# Patient Record
Sex: Female | Born: 1985
Health system: Southern US, Community
[De-identification: ages and names within clinical notes are randomized; demographics above are authoritative.]

## PROBLEM LIST (undated history)

## (undated) DIAGNOSIS — Z87442 Personal history of urinary calculi: Secondary | ICD-10-CM

## (undated) DIAGNOSIS — Z801 Family history of malignant neoplasm of trachea, bronchus and lung: Secondary | ICD-10-CM

## (undated) DIAGNOSIS — Z803 Family history of malignant neoplasm of breast: Secondary | ICD-10-CM

## (undated) DIAGNOSIS — F419 Anxiety disorder, unspecified: Secondary | ICD-10-CM

## (undated) DIAGNOSIS — C801 Malignant (primary) neoplasm, unspecified: Secondary | ICD-10-CM

## (undated) DIAGNOSIS — R51 Headache: Secondary | ICD-10-CM

## (undated) DIAGNOSIS — F32A Depression, unspecified: Secondary | ICD-10-CM

## (undated) DIAGNOSIS — Z800 Family history of malignant neoplasm of digestive organs: Secondary | ICD-10-CM

## (undated) DIAGNOSIS — F329 Major depressive disorder, single episode, unspecified: Secondary | ICD-10-CM

## (undated) DIAGNOSIS — R519 Headache, unspecified: Secondary | ICD-10-CM

## (undated) HISTORY — DX: Malignant (primary) neoplasm, unspecified: C80.1

## (undated) HISTORY — DX: Family history of malignant neoplasm of trachea, bronchus and lung: Z80.1

## (undated) HISTORY — DX: Family history of malignant neoplasm of breast: Z80.3

## (undated) HISTORY — DX: Family history of malignant neoplasm of digestive organs: Z80.0

## (undated) HISTORY — PX: OTHER SURGICAL HISTORY: SHX169

---

## 2008-03-08 ENCOUNTER — Emergency Department (HOSPITAL_COMMUNITY): Admission: EM | Admit: 2008-03-08 | Discharge: 2008-03-09 | Payer: Self-pay | Admitting: Emergency Medicine

## 2008-06-14 ENCOUNTER — Emergency Department (HOSPITAL_COMMUNITY): Admission: EM | Admit: 2008-06-14 | Discharge: 2008-06-14 | Payer: Self-pay | Admitting: Emergency Medicine

## 2009-11-18 ENCOUNTER — Inpatient Hospital Stay (HOSPITAL_COMMUNITY): Admission: AD | Admit: 2009-11-18 | Discharge: 2009-11-18 | Payer: Self-pay | Admitting: Obstetrics & Gynecology

## 2010-09-17 LAB — POCT PREGNANCY, URINE: Preg Test, Ur: NEGATIVE

## 2010-09-17 LAB — WET PREP, GENITAL
Clue Cells Wet Prep HPF POC: NONE SEEN
Trich, Wet Prep: NONE SEEN
Yeast Wet Prep HPF POC: NONE SEEN

## 2010-09-17 LAB — CBC
HCT: 35 % — ABNORMAL LOW (ref 36.0–46.0)
Hemoglobin: 12.1 g/dL (ref 12.0–15.0)
MCHC: 34.5 g/dL (ref 30.0–36.0)
MCV: 89.4 fL (ref 78.0–100.0)
Platelets: 260 10*3/uL (ref 150–400)
RBC: 3.91 MIL/uL (ref 3.87–5.11)
RDW: 12.9 % (ref 11.5–15.5)
WBC: 7.2 10*3/uL (ref 4.0–10.5)

## 2010-09-17 LAB — URINALYSIS, ROUTINE W REFLEX MICROSCOPIC
Bilirubin Urine: NEGATIVE
Glucose, UA: NEGATIVE mg/dL
Ketones, ur: NEGATIVE mg/dL
Nitrite: NEGATIVE
Protein, ur: 30 mg/dL — AB
Specific Gravity, Urine: 1.02 (ref 1.005–1.030)
Urobilinogen, UA: 0.2 mg/dL (ref 0.0–1.0)
pH: 6 (ref 5.0–8.0)

## 2010-09-17 LAB — GC/CHLAMYDIA PROBE AMP, GENITAL
Chlamydia, DNA Probe: NEGATIVE
GC Probe Amp, Genital: NEGATIVE

## 2010-09-17 LAB — URINE MICROSCOPIC-ADD ON

## 2011-04-05 LAB — URINE MICROSCOPIC-ADD ON

## 2011-04-05 LAB — URINALYSIS, ROUTINE W REFLEX MICROSCOPIC
Bilirubin Urine: NEGATIVE
Glucose, UA: NEGATIVE mg/dL
Ketones, ur: 40 mg/dL — AB
Leukocytes, UA: NEGATIVE
Nitrite: NEGATIVE
Protein, ur: 100 mg/dL — AB
Specific Gravity, Urine: 1.031 — ABNORMAL HIGH (ref 1.005–1.030)
Urobilinogen, UA: 0.2 mg/dL (ref 0.0–1.0)
pH: 5.5 (ref 5.0–8.0)

## 2011-04-05 LAB — POCT PREGNANCY, URINE: Preg Test, Ur: NEGATIVE

## 2018-02-03 ENCOUNTER — Ambulatory Visit: Payer: Self-pay | Admitting: Allergy and Immunology

## 2018-03-03 ENCOUNTER — Encounter: Payer: Self-pay | Admitting: Gynecology

## 2018-03-03 ENCOUNTER — Telehealth: Payer: Self-pay | Admitting: *Deleted

## 2018-03-03 ENCOUNTER — Ambulatory Visit: Payer: BLUE CROSS/BLUE SHIELD | Admitting: Gynecology

## 2018-03-03 VITALS — BP 116/74 | Ht 62.0 in | Wt 137.0 lb

## 2018-03-03 DIAGNOSIS — Z308 Encounter for other contraceptive management: Secondary | ICD-10-CM

## 2018-03-03 DIAGNOSIS — Z30432 Encounter for removal of intrauterine contraceptive device: Secondary | ICD-10-CM

## 2018-03-03 DIAGNOSIS — N6320 Unspecified lump in the left breast, unspecified quadrant: Secondary | ICD-10-CM

## 2018-03-03 DIAGNOSIS — Z13220 Encounter for screening for lipoid disorders: Secondary | ICD-10-CM

## 2018-03-03 DIAGNOSIS — N63 Unspecified lump in unspecified breast: Secondary | ICD-10-CM

## 2018-03-03 DIAGNOSIS — Z01411 Encounter for gynecological examination (general) (routine) with abnormal findings: Secondary | ICD-10-CM | POA: Diagnosis not present

## 2018-03-03 DIAGNOSIS — Z1151 Encounter for screening for human papillomavirus (HPV): Secondary | ICD-10-CM

## 2018-03-03 LAB — CBC WITH DIFFERENTIAL/PLATELET
Basophils Absolute: 58 cells/uL (ref 0–200)
Basophils Relative: 1.1 %
Eosinophils Absolute: 133 cells/uL (ref 15–500)
Eosinophils Relative: 2.5 %
HCT: 35 % (ref 35.0–45.0)
Hemoglobin: 11.4 g/dL — ABNORMAL LOW (ref 11.7–15.5)
Lymphs Abs: 1882 cells/uL (ref 850–3900)
MCH: 27.7 pg (ref 27.0–33.0)
MCHC: 32.6 g/dL (ref 32.0–36.0)
MCV: 85.2 fL (ref 80.0–100.0)
MPV: 10.7 fL (ref 7.5–12.5)
Monocytes Relative: 8.9 %
Neutro Abs: 2756 cells/uL (ref 1500–7800)
Neutrophils Relative %: 52 %
Platelets: 363 10*3/uL (ref 140–400)
RBC: 4.11 10*6/uL (ref 3.80–5.10)
RDW: 12.6 % (ref 11.0–15.0)
Total Lymphocyte: 35.5 %
WBC mixed population: 472 cells/uL (ref 200–950)
WBC: 5.3 10*3/uL (ref 3.8–10.8)

## 2018-03-03 LAB — LIPID PANEL
Cholesterol: 240 mg/dL — ABNORMAL HIGH (ref <200)
HDL: 100 mg/dL (ref >50)
LDL Cholesterol (Calc): 124 mg/dL (calc) — ABNORMAL HIGH
Non-HDL Cholesterol (Calc): 140 mg/dL (calc) — ABNORMAL HIGH (ref <130)
Total CHOL/HDL Ratio: 2.4 (calc) (ref <5.0)
Triglycerides: 72 mg/dL (ref <150)

## 2018-03-03 LAB — COMPREHENSIVE METABOLIC PANEL
AG Ratio: 1.9 (calc) (ref 1.0–2.5)
ALT: 11 U/L (ref 6–29)
AST: 12 U/L (ref 10–30)
Albumin: 4.3 g/dL (ref 3.6–5.1)
Alkaline phosphatase (APISO): 54 U/L (ref 33–115)
BUN: 9 mg/dL (ref 7–25)
CO2: 28 mmol/L (ref 20–32)
Calcium: 9.4 mg/dL (ref 8.6–10.2)
Chloride: 106 mmol/L (ref 98–110)
Creat: 0.63 mg/dL (ref 0.50–1.10)
Globulin: 2.3 g/dL (calc) (ref 1.9–3.7)
Glucose, Bld: 95 mg/dL (ref 65–99)
Potassium: 5 mmol/L (ref 3.5–5.3)
Sodium: 138 mmol/L (ref 135–146)
Total Bilirubin: 0.4 mg/dL (ref 0.2–1.2)
Total Protein: 6.6 g/dL (ref 6.1–8.1)

## 2018-03-03 NOTE — Telephone Encounter (Signed)
-----   Message from Anastasio Auerbach, MD sent at 03/03/2018 11:23 AM EDT ----- Schedule at the breast center diagnostic mammography and ultrasound reference new onset left breast mass 1 o'clock position 1 fingerbreadth off of areola

## 2018-03-03 NOTE — Telephone Encounter (Signed)
Patient scheduled on 03/06/18 @ 1:30pm at breast center, will route to claudia to relay.

## 2018-03-03 NOTE — Addendum Note (Signed)
Addended by: Nelva Nay on: 03/03/2018 12:22 PM   Modules accepted: Orders

## 2018-03-03 NOTE — Patient Instructions (Signed)
Office will call to arrange the [place] IUD appointment  Office will call to arrange for the breast ultrasound and mammogram

## 2018-03-03 NOTE — Telephone Encounter (Signed)
Patient informed. Phone number and address given to patient to the breast center.

## 2018-03-03 NOTE — Progress Notes (Signed)
Samantha Mcneil May 28, 1986 [id number]        32 y.o.  G2P2 new patient for annual gynecologic exam.  Reports it has been 4 to 5 years since her last GYN exam.  Patient also wants to discuss contraceptive options.  Currently has an IUD that she believes is a copper IUD but thought that they told her it was good for 5 years.  Has been in place for 10 years.  Having sporadic periods.  Also has noticed a lump in her left breast over the last 1 to 2 months.  It is not painful.  Has not noticed it previously.  Past medical history,surgical history, problem list, medications, allergies, family history and social history were all reviewed and documented as reviewed in the EPIC chart.  ROS:  Performed with pertinent positives and negatives included in the history, assessment and plan.   Additional significant findings : None   Exam: [name] assistant Vitals:   03/03/18 1010  BP: 116/74  Weight: 137 lb (62.1 kg)  Height: 5\' 2"  (1.575 m)   Body mass index is 25.06 kg/m.  General appearance:  Normal affect, orientation and appearance. Skin: Grossly normal HEENT: Without gross lesions.  No cervical or supraclavicular adenopathy. Thyroid normal.  Lungs:  Clear without wheezing, rales or rhonchi Cardiac: RR, without RMG Abdominal:  Soft, nontender, without masses, guarding, rebound, organomegaly or hernia Breasts:  Examined lying and sitting.  Right without masses, retractions, discharge or axillary adenopathy.  Left with 1.5 to 2 cm firm nodular mass, mobile, no overlying skin changes 12 to 1 o'clock position 1 fingerbreadth off of areola.  Physical Exam  Pulmonary/Chest:      Pelvic:  Ext, BUS, Vagina: Normal  Cervix: Normal.  IUD string visualized.  Pap smear/HPV  Uterus: Axial, normal size, shape and contour, midline and mobile nontender   Adnexa: Without masses or tenderness    Anus and perineum: Normal   Rectovaginal: Normal sphincter tone without palpated masses or  tenderness.   Procedures:  1. The IUD string was grasped with a [place] forcep and the IUD was removed without difficulty.  It was a ParaGard IUD.  It was shown to the patient and discarded. 2. The skin overlying the left breast mass was cleansed with alcohol and using an 18-gauge needle several passes were made into the mass without return of any fluid.  Band-Aid applied afterwards.  Patient tolerated well  Assessment/Plan:  32 y.o. G2P2 female for annual gynecologic exam with irregular menses, IUD contraception.   1. IUD.  Her ParaGard IUD was removed to verify the type.  We discussed all contraceptive options available to include pills, Nexplanon, Depo-Provera, IUDs, sterilization.  The pros and cons of each choice discussed.  She does have a history of migraine headaches.  Had tried pills years ago but had issues remembering.  We discussed possible increased risk of stroke associated with combination contraception such as pill patch or ring.  She also had tried Depo-Provera in the past.  At this point she is leaning towards replacement of her IUD.  We discussed the pros and cons of the Mirena versus ParaGard.  Issues of hormonal absorption with the Mirena discussed.  At this point the patient wants to move forward with following up for a Mirena IUD and she will schedule an appointment in follow-up for this. 2. Left breast mass.  Firm on exam.  No fluid on aspiration.  Discussed possible/probable fibroadenoma possible smaller cyst I could not get into  versus unlikely malignancy.  Discussed the need for further work-up to include mammogram and ultrasound.  Patient knows to expect a call from the breast center and will call us if she does not hear from them. 3. Pap smear 5 to 6 years ago.  Pap smear/HPV today.  No history of significant abnormal Pap smears previously. 4. Health maintenance.  Baseline CBC, CMP and lipid profile ordered.  Patient will follow-up for Mirena IUD placement as scheduled.   Patient will follow-up for mammography and breast ultrasound.  Follow-up in 1 year for annual exam.   Anastasio Auerbach MD, 11:13 AM 03/03/2018

## 2018-03-04 LAB — PAP IG AND HPV HIGH-RISK: HPV DNA High Risk: NOT DETECTED

## 2018-03-05 ENCOUNTER — Other Ambulatory Visit: Payer: Self-pay | Admitting: Obstetrics & Gynecology

## 2018-03-05 DIAGNOSIS — E7800 Pure hypercholesterolemia, unspecified: Secondary | ICD-10-CM

## 2018-03-06 ENCOUNTER — Ambulatory Visit
Admission: RE | Admit: 2018-03-06 | Discharge: 2018-03-06 | Disposition: A | Discharge status: Home / Self Care | Payer: BLUE CROSS/BLUE SHIELD | Source: Ambulatory Visit | Attending: Gynecology | Admitting: Gynecology

## 2018-03-06 ENCOUNTER — Other Ambulatory Visit: Payer: Self-pay | Admitting: Gynecology

## 2018-03-06 DIAGNOSIS — N6320 Unspecified lump in the left breast, unspecified quadrant: Secondary | ICD-10-CM

## 2018-03-06 DIAGNOSIS — R599 Enlarged lymph nodes, unspecified: Secondary | ICD-10-CM

## 2018-03-12 ENCOUNTER — Ambulatory Visit
Admission: RE | Admit: 2018-03-12 | Discharge: 2018-03-12 | Disposition: A | Discharge status: Home / Self Care | Payer: BLUE CROSS/BLUE SHIELD | Source: Ambulatory Visit | Attending: Gynecology | Admitting: Gynecology

## 2018-03-12 ENCOUNTER — Other Ambulatory Visit: Payer: Self-pay | Admitting: Gynecology

## 2018-03-12 DIAGNOSIS — N6320 Unspecified lump in the left breast, unspecified quadrant: Secondary | ICD-10-CM

## 2018-03-12 DIAGNOSIS — R599 Enlarged lymph nodes, unspecified: Secondary | ICD-10-CM

## 2018-03-16 ENCOUNTER — Other Ambulatory Visit: Payer: Self-pay | Admitting: General Surgery

## 2018-03-16 DIAGNOSIS — C50412 Malignant neoplasm of upper-outer quadrant of left female breast: Secondary | ICD-10-CM

## 2018-03-17 ENCOUNTER — Telehealth: Payer: Self-pay | Admitting: Hematology and Oncology

## 2018-03-17 NOTE — Telephone Encounter (Signed)
New referral from Dr. Barry Dienes for breast cancer. Pt has been scheduled to see Dr. Lindi Adie on 9/18 at 11am. Pt aware to arrive 30 minutes early. Letter mailed.

## 2018-03-18 ENCOUNTER — Telehealth: Payer: Self-pay | Admitting: Hematology and Oncology

## 2018-03-18 ENCOUNTER — Encounter: Payer: Self-pay | Admitting: Hematology and Oncology

## 2018-03-18 ENCOUNTER — Other Ambulatory Visit: Payer: Self-pay | Admitting: General Surgery

## 2018-03-18 ENCOUNTER — Encounter: Payer: Self-pay | Admitting: *Deleted

## 2018-03-18 ENCOUNTER — Inpatient Hospital Stay: Payer: BLUE CROSS/BLUE SHIELD | Attending: Hematology and Oncology | Admitting: Hematology and Oncology

## 2018-03-18 DIAGNOSIS — Z79899 Other long term (current) drug therapy: Secondary | ICD-10-CM | POA: Insufficient documentation

## 2018-03-18 DIAGNOSIS — C50412 Malignant neoplasm of upper-outer quadrant of left female breast: Secondary | ICD-10-CM | POA: Insufficient documentation

## 2018-03-18 DIAGNOSIS — Z23 Encounter for immunization: Secondary | ICD-10-CM | POA: Insufficient documentation

## 2018-03-18 DIAGNOSIS — Z171 Estrogen receptor negative status [ER-]: Principal | ICD-10-CM

## 2018-03-18 MED ORDER — VENLAFAXINE HCL ER 37.5 MG PO CP24: 37.5000 mg | ORAL_CAPSULE | Freq: Every day | ORAL | 6 refills | Status: DC

## 2018-03-18 MED FILL — VENLAFAXINE HCL ER 37.5 MG: 37.5 | 30 days supply | Qty: 30 | Fill #0

## 2018-03-18 NOTE — Telephone Encounter (Signed)
Gave avs and calendar patient ok with 10/2 and 10/3

## 2018-03-18 NOTE — Progress Notes (Signed)
[facility] CONSULT NOTE  Patient Care Team: Patient, [name] as PCP - General (General Practice)  CHIEF COMPLAINTS/PURPOSE OF CONSULTATION:  Newly diagnosed breast cancer  HISTORY OF PRESENTING ILLNESS:  Samantha Mcneil 32 y.o. female is here because of recent diagnosis of left breast cancer.  Patient felt lumps in both her breasts and she underwent testing with mammogram and ultrasound.  There was a 2.3 cm mass in the left breast and biopsy came back as invasive ductal carcinoma grade 3 that was triple negative with a Ki-67 90%.  She had a lymph node biopsy which was benign.  Patient was presented this morning at the multidisciplinary tumor board and she is here today to discuss her treatment plan.  I reviewed her records extensively and collaborated the history with the patient.  SUMMARY OF ONCOLOGIC HISTORY:   Malignant neoplasm of upper-outer quadrant of left breast in female, estrogen receptor negative ([place])   03/12/2018 Initial Diagnosis    Palpable masses in both breasts with family history of breast cancer, breast density category D, ultrasound measured these hypoechoic irregular mass left breast 2.3 cm, right breast no abnormality, biopsy left breast mass: IDC grade 3 ER 0%, PR 0%, HER-2 negative, Ki-67 90%, lymph node biopsy benign: T2N0    03/18/2018 Cancer Staging    Staging form: Breast, AJCC 8th Edition - Clinical: Stage IIB (cT2, cN0, cM0, G3, ER-, PR-, HER2-) - Signed by Nicholas Lose, MD on 03/18/2018     MEDICAL HISTORY:  No past medical history on file.  SURGICAL HISTORY: Past Surgical History:  Procedure Laterality Date  . CESAREAN SECTION    . OTHER SURGICAL HISTORY      SOCIAL HISTORY: Social History   Socioeconomic History  . Marital status: Married    Spouse name: Not on file  . Number of children: Not on file  . Years of education: Not on file  . Highest education level: Not on file  Occupational History  . Not on file  Social  Needs  . Financial resource strain: Not on file  . Food insecurity:    Worry: Not on file    Inability: Not on file  . Transportation needs:    Medical: Not on file    Non-medical: Not on file  Tobacco Use  . Smoking status: Never Smoker  . Smokeless tobacco: Never Used  Substance and Sexual Activity  . Alcohol use: Yes    Comment: Rare  . Drug use: Never  . Sexual activity: Yes    Comment: 1st intercourse 32 yo-Fewer than 5 partners  Lifestyle  . Physical activity:    Days per week: Not on file    Minutes per session: Not on file  . Stress: Not on file  Relationships  . Social connections:    Talks on phone: Not on file    Gets together: Not on file    Attends religious service: Not on file    Active member of club or organization: Not on file    Attends meetings of clubs or organizations: Not on file    Relationship status: Not on file  . Intimate partner violence:    Fear of current or ex partner: Not on file    Emotionally abused: Not on file    Physically abused: Not on file    Forced sexual activity: Not on file  Other Topics Concern  . Not on file  Social History Narrative  . Not on file  FAMILY HISTORY: Family History  Problem Relation Age of Onset  . Heart disease Father   . Breast cancer Maternal Aunt        30's  . Breast cancer Cousin        diagnosed in her 59's    ALLERGIES:  has No Known Allergies.  MEDICATIONS:  Current Outpatient Medications  Medication Sig Dispense Refill  . venlafaxine XR (EFFEXOR-XR) 37.5 MG 24 hr capsule Take 1 capsule (37.5 mg total) by mouth daily with breakfast. 30 capsule 6   No current facility-administered medications for this visit.     REVIEW OF SYSTEMS:   Constitutional: Denies fevers, chills or abnormal night sweats Eyes: Denies blurriness of vision, double vision or watery eyes Ears, nose, mouth, throat, and face: Denies mucositis or sore throat Respiratory: Denies cough, dyspnea or  wheezes Cardiovascular: Denies palpitation, chest discomfort or lower extremity swelling Gastrointestinal:  Denies nausea, heartburn or change in bowel habits Skin: Denies abnormal skin rashes Lymphatics: Denies new lymphadenopathy or easy bruising Neurological:Denies numbness, tingling or new weaknesses Behavioral/Psych: Mood is stable, no new changes  Breast: Palpable lumps in both breasts All other systems were reviewed with the patient and are negative.  PHYSICAL EXAMINATION: ECOG PERFORMANCE STATUS: 1 - Symptomatic but completely ambulatory  Vitals:   03/18/18 1058  BP: 113/76  Pulse: 84  Resp: 20  Temp: 98.3 F (36.8 C)  SpO2: 100%   Filed Weights   03/18/18 1058  Weight: 137 lb 11.2 oz (62.5 kg)    GENERAL:alert, no distress and comfortable SKIN: skin color, texture, turgor are normal, no rashes or significant lesions EYES: normal, conjunctiva are pink and non-injected, sclera clear OROPHARYNX:no exudate, no erythema and lips, buccal mucosa, and tongue normal  NECK: supple, thyroid normal size, non-tender, without nodularity LYMPH:  no palpable lymphadenopathy in the cervical, axillary or inguinal LUNGS: clear to auscultation and percussion with normal breathing effort HEART: regular rate & rhythm and no murmurs and no lower extremity edema ABDOMEN:abdomen soft, non-tender and normal bowel sounds Musculoskeletal:no cyanosis of digits and no clubbing  PSYCH: alert & oriented x 3 with fluent speech NEURO: no focal motor/sensory deficits BREAST: Palpable lump in the left breast.. No palpable axillary or supraclavicular lymphadenopathy (exam performed in the presence of a chaperone)   LABORATORY DATA:  I have reviewed the data as listed Lab Results  Component Value Date   WBC 5.3 03/03/2018   HGB 11.4 (L) 03/03/2018   HCT 35.0 03/03/2018   MCV 85.2 03/03/2018   PLT 363 03/03/2018   Lab Results  Component Value Date   NA 138 03/03/2018   K 5.0 03/03/2018   CL  106 03/03/2018   CO2 28 03/03/2018    RADIOGRAPHIC STUDIES: I have personally reviewed the radiological reports and agreed with the findings in the report.  ASSESSMENT AND PLAN:  Malignant neoplasm of upper-outer quadrant of left breast in female, estrogen receptor negative ([place]) 03/12/2018:Palpable masses in both breasts with family history of breast cancer, breast density category D, ultrasound measured these hypoechoic irregular mass left breast 2.3 cm, right breast no abnormality, biopsy left breast mass: IDC grade 3 ER 0%, PR 0%, HER-2 negative, Ki-67 90%, lymph node biopsy benign: T2N0, stage IIb  Pathology and radiology counseling: Discussed with the patient, the details of pathology including the type of breast cancer,the clinical staging, the significance of ER, PR and HER-2/neu receptors and the implications for treatment. After reviewing the pathology in detail, we proceeded to discuss the different  treatment options between surgery, radiation, chemotherapy, antiestrogen therapies.  Recommendation based on multidisciplinary tumor board: Genetics consultation 1. Neoadjuvant chemotherapy with Adriamycin and Cytoxan dose dense 4 followed by Taxol with carboplatin weekly 12 2. Followed by breast conserving surgery with sentinel lymph node study vs targeted axillary dissection 3. Followed by adjuvant radiation therapy  Chemotherapy Counseling: I discussed the risks and benefits of chemotherapy including the risks of nausea/ vomiting, risk of infection from low WBC count, fatigue due to chemo or anemia, bruising or bleeding due to low platelets, mouth sores, loss/ change in taste and decreased appetite. Liver and kidney function will be monitored through out chemotherapy as abnormalities in liver and kidney function may be a side effect of treatment. Cardiac dysfunction due to Adriamycin was discussed in detail. Risk of permanent bone marrow dysfunction due to chemo were also  discussed.  Plan: 1. Port placement 2. Echocardiogram 3. Chemotherapy class 4. Breast MRI  Genetic counseling will also be arranged  UPBEAT clinical trial ([place]): Newly diagnosed stage I to III breast cancer patients receiving either adjuvant or neoadjuvant chemotherapy undergo cardiac MRI before treatment and at 24 months along with neurocognitive testing, exercise and disability measures at baseline 3, 12 and 24 months.  Return to clinic in 2 weeks to start chemotherapy.   All questions were answered. The patient knows to call the clinic with any problems, questions or concerns.    Harriette Ohara, MD 03/18/18

## 2018-03-18 NOTE — Assessment & Plan Note (Signed)
03/12/2018:Palpable masses in both breasts with family history of breast cancer, breast density category D, ultrasound measured these hypoechoic irregular mass left breast 2.3 cm, right breast no abnormality, biopsy left breast mass: IDC grade 3 ER 0%, PR 0%, HER-2 negative, Ki-67 90%, lymph node biopsy benign: T2N0, stage IIb  Pathology and radiology counseling: Discussed with the patient, the details of pathology including the type of breast cancer,the clinical staging, the significance of ER, PR and HER-2/neu receptors and the implications for treatment. After reviewing the pathology in detail, we proceeded to discuss the different treatment options between surgery, radiation, chemotherapy, antiestrogen therapies.  Recommendation based on multidisciplinary tumor board: 1. Neoadjuvant chemotherapy with Adriamycin and Cytoxan dose dense 4 followed by Taxol with carboplatin weekly 12 2. Followed by breast conserving surgery with sentinel lymph node study vs targeted axillary dissection 3. Followed by adjuvant radiation therapy  Chemotherapy Counseling: I discussed the risks and benefits of chemotherapy including the risks of nausea/ vomiting, risk of infection from low WBC count, fatigue due to chemo or anemia, bruising or bleeding due to low platelets, mouth sores, loss/ change in taste and decreased appetite. Liver and kidney function will be monitored through out chemotherapy as abnormalities in liver and kidney function may be a side effect of treatment. Cardiac dysfunction due to Adriamycin was discussed in detail. Risk of permanent bone marrow dysfunction due to chemo were also discussed.  Plan: 1. Port placement 2. Echocardiogram 3. Chemotherapy class 4. Breast MRI  Genetic counseling will also be arranged  UPBEAT clinical trial ([place]): Newly diagnosed stage I to III breast cancer patients receiving either adjuvant or neoadjuvant chemotherapy undergo cardiac MRI before treatment and at  24 months along with neurocognitive testing, exercise and disability measures at baseline 3, 12 and 24 months.  Return to clinic in 2 weeks to start chemotherapy.

## 2018-03-18 NOTE — Progress Notes (Signed)
Pt came in to inquire about the [employer].  I went over what it covers, gave her an expense sheet and the income requirement.  She will bring [place] her proof of income on 03/30/18.  Since her treatment plan has not been put in yet, I informed her that Marguarite Arbour will contact her if additional assistance is available at that time.  I gave her Shauna's card for any questions or concerns she may have before her next visit.

## 2018-03-18 NOTE — Progress Notes (Signed)

## 2018-03-24 ENCOUNTER — Inpatient Hospital Stay: Payer: BLUE CROSS/BLUE SHIELD

## 2018-03-24 ENCOUNTER — Telehealth: Payer: Self-pay | Admitting: Hematology and Oncology

## 2018-03-24 ENCOUNTER — Ambulatory Visit (HOSPITAL_COMMUNITY)
Admission: RE | Admit: 2018-03-24 | Discharge: 2018-03-24 | Disposition: A | Discharge status: Home / Self Care | Payer: BLUE CROSS/BLUE SHIELD | Source: Ambulatory Visit | Attending: Hematology and Oncology | Admitting: Hematology and Oncology

## 2018-03-24 DIAGNOSIS — Z171 Estrogen receptor negative status [ER-]: Secondary | ICD-10-CM | POA: Diagnosis present

## 2018-03-24 DIAGNOSIS — C50412 Malignant neoplasm of upper-outer quadrant of left female breast: Secondary | ICD-10-CM | POA: Insufficient documentation

## 2018-03-24 NOTE — Telephone Encounter (Signed)
pts appts r/s per 9/24 sch message

## 2018-03-25 ENCOUNTER — Other Ambulatory Visit: Payer: Self-pay

## 2018-03-25 ENCOUNTER — Encounter: Payer: Self-pay | Admitting: Hematology and Oncology

## 2018-03-25 ENCOUNTER — Encounter (HOSPITAL_BASED_OUTPATIENT_CLINIC_OR_DEPARTMENT_OTHER): Payer: Self-pay | Admitting: *Deleted

## 2018-03-25 NOTE — Progress Notes (Signed)
Pt coming Monday to pick up Ensure. Bring all medications.

## 2018-03-25 NOTE — Progress Notes (Signed)
Called patient to introduce myself as Arboriculturist and to ask if she had any financial questions or concerns regarding your treatment. Patient states yes she isn't sure how much this will cost. Advised patient depending on her plan percentage, she will more than likely be left with an out of pocket expense unless she has met everything. Advised her to contact insurance company regarding specific amounts. Advised her there are copay programs she may apply for such as PAF which currently has funds available for her diagnosis. Advised her the exact amount of household income would be needed in case she is selected for audit. She verbalized understanding and will obtain her spouse's income information and get back with me. Also advised that [employer] has a copay assistance program for [place] only and I can apply on her behalf online. She verbalized understanding and agreed. Also advised she may apply for the one-time $1000 grant which may assist with medications and other personal expenses while gong through treatment and income is needed as well. She verbalized understanding. She will try to gather information and call me back or meet with me on 03/30/18 to provide. She has my contact name and number for any additional financial questions or concerns.  Attempted to enroll patient in [employer] online and received an error message. Contacted [employer] and spoke with Tiffany whom walked me through again and was unsuccessful. Was transferred to [place] whom also was having technical difficulties. He states it will be escalated and to retry on Friday morning.

## 2018-03-27 ENCOUNTER — Other Ambulatory Visit: Payer: Self-pay

## 2018-03-27 DIAGNOSIS — C50412 Malignant neoplasm of upper-outer quadrant of left female breast: Secondary | ICD-10-CM

## 2018-03-27 DIAGNOSIS — Z171 Estrogen receptor negative status [ER-]: Principal | ICD-10-CM

## 2018-03-28 ENCOUNTER — Other Ambulatory Visit: Payer: Self-pay

## 2018-03-28 ENCOUNTER — Inpatient Hospital Stay
Admission: RE | Admit: 2018-03-28 | Discharge: 2018-03-28 | Disposition: A | Discharge status: Home / Self Care | Payer: BLUE CROSS/BLUE SHIELD | Source: Ambulatory Visit | Attending: General Surgery | Admitting: General Surgery

## 2018-03-29 NOTE — Assessment & Plan Note (Signed)
03/12/2018:Palpable masses in both breasts with family history of breast cancer, breast density category D, ultrasound measured these hypoechoic irregular mass left breast 2.3 cm, right breast no abnormality, biopsy left breast mass: IDC grade 3 ER 0%, PR 0%, HER-2 negative, Ki-67 90%, lymph node biopsy benign: T2N0, stage IIb  Recommendation based on multidisciplinary tumor board: Genetics consultation 1. Neoadjuvant chemotherapy with Adriamycin and Cytoxan dose dense 4 followed by Taxol with carboplatin weekly 12 2. Followed by breast conserving surgery with sentinel lymph node study vs targeted axillary dissection 3. Followed by adjuvant radiation therapy ---------------------------------------------------------------------------------------------------------------- Current Treatment: Cycle 1 Dose dense Adriamycin and Cytoxan Chemo counseling completed Labs reviewed ECHO done one RTC in 1 week for tox check

## 2018-03-30 ENCOUNTER — Inpatient Hospital Stay (HOSPITAL_BASED_OUTPATIENT_CLINIC_OR_DEPARTMENT_OTHER): Payer: BLUE CROSS/BLUE SHIELD | Admitting: Hematology and Oncology

## 2018-03-30 ENCOUNTER — Ambulatory Visit
Admission: RE | Admit: 2018-03-30 | Discharge: 2018-03-30 | Disposition: A | Discharge status: Home / Self Care | Payer: BLUE CROSS/BLUE SHIELD | Source: Ambulatory Visit | Attending: General Surgery | Admitting: General Surgery

## 2018-03-30 ENCOUNTER — Inpatient Hospital Stay: Payer: BLUE CROSS/BLUE SHIELD

## 2018-03-30 ENCOUNTER — Encounter: Payer: Self-pay | Admitting: Hematology and Oncology

## 2018-03-30 VITALS — BP 104/59 | HR 65 | Temp 98.6°F | Resp 18 | Ht 64.0 in | Wt 137.1 lb

## 2018-03-30 DIAGNOSIS — Z79899 Other long term (current) drug therapy: Secondary | ICD-10-CM | POA: Diagnosis not present

## 2018-03-30 DIAGNOSIS — Z171 Estrogen receptor negative status [ER-]: Secondary | ICD-10-CM

## 2018-03-30 DIAGNOSIS — C50412 Malignant neoplasm of upper-outer quadrant of left female breast: Secondary | ICD-10-CM

## 2018-03-30 LAB — CBC WITH DIFFERENTIAL (CANCER CENTER ONLY)
Basophils Absolute: 0 10*3/uL (ref 0.0–0.1)
Basophils Relative: 1 %
Eosinophils Absolute: 0.1 10*3/uL (ref 0.0–0.5)
Eosinophils Relative: 3 %
HCT: 35.2 % (ref 34.8–46.6)
Hemoglobin: 11.7 g/dL (ref 11.6–15.9)
Lymphocytes Relative: 25 %
Lymphs Abs: 0.9 10*3/uL (ref 0.9–3.3)
MCH: 28.5 pg (ref 25.1–34.0)
MCHC: 33.2 g/dL (ref 31.5–36.0)
MCV: 85.7 fL (ref 79.5–101.0)
Monocytes Absolute: 0.4 10*3/uL (ref 0.1–0.9)
Monocytes Relative: 12 %
Neutro Abs: 2.2 10*3/uL (ref 1.5–6.5)
Neutrophils Relative %: 59 %
Platelet Count: 260 10*3/uL (ref 145–400)
RBC: 4.1 MIL/uL (ref 3.70–5.45)
RDW: 14.2 % (ref 11.2–14.5)
WBC Count: 3.6 10*3/uL — ABNORMAL LOW (ref 3.9–10.3)

## 2018-03-30 LAB — CMP (CANCER CENTER ONLY)
ALT: 14 U/L (ref 0–44)
AST: 14 U/L — ABNORMAL LOW (ref 15–41)
Albumin: 3.9 g/dL (ref 3.5–5.0)
Alkaline Phosphatase: 59 U/L (ref 38–126)
Anion gap: 6 (ref 5–15)
BUN: 9 mg/dL (ref 6–20)
CO2: 28 mmol/L (ref 22–32)
Calcium: 9.1 mg/dL (ref 8.9–10.3)
Chloride: 105 mmol/L (ref 98–111)
Creatinine: 0.66 mg/dL (ref 0.44–1.00)
GFR, Est AFR Am: >60 mL/min (ref >60)
GFR, Estimated: >60 mL/min (ref >60)
Glucose, Bld: 94 mg/dL (ref 70–99)
Potassium: 4.4 mmol/L (ref 3.5–5.1)
Sodium: 139 mmol/L (ref 135–145)
Total Bilirubin: 0.4 mg/dL (ref 0.3–1.2)
Total Protein: 6.7 g/dL (ref 6.5–8.1)

## 2018-03-30 MED ORDER — LIDOCAINE-PRILOCAINE 2.5-2.5 % EX CREA: TOPICAL_CREAM | CUTANEOUS | 3 refills | Status: DC

## 2018-03-30 MED ORDER — INFLUENZA VAC SPLIT QUAD 0.5 ML IM SUSY
0.5000 mL | PREFILLED_SYRINGE | Freq: Once | INTRAMUSCULAR | Status: AC
  Administered 2018-03-30: 0.5 mL via INTRAMUSCULAR

## 2018-03-30 MED ORDER — PROCHLORPERAZINE MALEATE 10 MG PO TABS: 10.0000 mg | ORAL_TABLET | Freq: Four times a day (QID) | ORAL | 1 refills | Status: DC | PRN

## 2018-03-30 MED ORDER — GADOBENATE DIMEGLUMINE 529 MG/ML IV SOLN
12.0000 mL | Freq: Once | INTRAVENOUS | Status: AC | PRN
  Administered 2018-03-30: 12 mL via INTRAVENOUS

## 2018-03-30 MED ORDER — ONDANSETRON HCL 8 MG PO TABS: 8.0000 mg | ORAL_TABLET | Freq: Two times a day (BID) | ORAL | 1 refills | Status: DC | PRN

## 2018-03-30 MED ORDER — INFLUENZA VAC SPLIT QUAD 0.5 ML IM SUSY
PREFILLED_SYRINGE | INTRAMUSCULAR | Status: AC
  Filled 2018-03-30: qty 0.5

## 2018-03-30 MED ORDER — DEXAMETHASONE 4 MG PO TABS: 4.0000 mg | ORAL_TABLET | Freq: Every day | ORAL | 0 refills | Status: AC

## 2018-03-30 MED ORDER — LORAZEPAM 0.5 MG PO TABS: 0.5000 mg | ORAL_TABLET | Freq: Every evening | ORAL | 0 refills | Status: DC | PRN

## 2018-03-30 MED FILL — ONDANSETRON HCL 8 MG TABLET: 8 | 15 days supply | Qty: 30 | Fill #0

## 2018-03-30 MED FILL — LORazepam 0.5 MG TABS: 0.5 | 30 days supply | Qty: 30 | Fill #0

## 2018-03-30 MED FILL — LIDOCAINE-PRILOCAINE CREAM: 2.5-2.5 | 30 days supply | Qty: 30 | Fill #0

## 2018-03-30 MED FILL — PROCHLORPERAZINE 10 MG TAB: 10 | 7 days supply | Qty: 30 | Fill #0

## 2018-03-30 MED FILL — DEXAMETHASONE 4 MG TABLET: 4 | 8 days supply | Qty: 8 | Fill #0

## 2018-03-30 NOTE — Progress Notes (Signed)
Went back on [employer] online and attempted to enroll patient in copay assistance for [place].  Patient successfully enrolled and approved for up to a maximum of $15,000 with a $0 copay for each treatment after insurance pays over the next 12 months. Her earliest acceptable claim date is 12/30/17. Paperwork given to [employer] for copay/billing purposes.

## 2018-03-30 NOTE — Progress Notes (Signed)
Patient Care Team: Patient, [name] as PCP - General (General Practice)  DIAGNOSIS:  Encounter Diagnosis  Name Primary?  . Malignant neoplasm of upper-outer quadrant of left breast in female, estrogen receptor negative ([place]) Yes    SUMMARY OF ONCOLOGIC HISTORY:   Malignant neoplasm of upper-outer quadrant of left breast in female, estrogen receptor negative ([place])   03/12/2018 Initial Diagnosis    Palpable masses in both breasts with family history of breast cancer, breast density category D, ultrasound measured these hypoechoic irregular mass left breast 2.3 cm, right breast no abnormality, biopsy left breast mass: IDC grade 3 ER 0%, PR 0%, HER-2 negative, Ki-67 90%, lymph node biopsy benign: T2N0    03/18/2018 Cancer Staging    Staging form: Breast, AJCC 8th Edition - Clinical: Stage IIB (cT2, cN0, cM0, G3, ER-, PR-, HER2-) - Signed by Nicholas Lose, MD on 03/18/2018     CHIEF COMPLIANT: Follow-up to discuss her treatment plan.  INTERVAL HISTORY: Samantha Mcneil is a 32 year old with above-mentioned history of palpable masses in both her breasts and was diagnosed with left breast cancer.  She is here today accompanied by her family to discuss additional treatment plans.  She is scheduled to undergo breast MRI today.  She has not been set up for the CT scans.  She has an appointment to start chemotherapy next week and she is going to [facility] for second opinion today.  REVIEW OF SYSTEMS:   Constitutional: Denies fevers, chills or abnormal weight loss Eyes: Denies blurriness of vision Ears, nose, mouth, throat, and face: Denies mucositis or sore throat Respiratory: Denies cough, dyspnea or wheezes Cardiovascular: Denies palpitation, chest discomfort Gastrointestinal:  Denies nausea, heartburn or change in bowel habits Skin: Denies abnormal skin rashes Lymphatics: Denies new lymphadenopathy or easy bruising Neurological:Denies numbness, tingling or new weaknesses Behavioral/Psych:  Mood is stable, no new changes  Extremities: No lower extremity edema  All other systems were reviewed with the patient and are negative.  I have reviewed the past medical history, past surgical history, social history and family history with the patient and they are unchanged from previous note.  ALLERGIES:  has No Known Allergies.  MEDICATIONS:  Current Outpatient Medications  Medication Sig Dispense Refill  . dexamethasone (DECADRON) 4 MG tablet Take 1 tablet (4 mg total) by mouth daily for 2 days. Take 1 tablet the day after chemo and 1 tablet 2 days after, with food 8 tablet 0  . ibuprofen (ADVIL,MOTRIN) 200 MG tablet Take 200 mg by mouth every 6 (six) hours as needed.    . lidocaine-prilocaine (EMLA) cream Apply to affected area once 30 g 3  . LORazepam (ATIVAN) 0.5 MG tablet Take 1 tablet (0.5 mg total) by mouth at bedtime as needed for sleep. 30 tablet 0  . ondansetron (ZOFRAN) 8 MG tablet Take 1 tablet (8 mg total) by mouth 2 (two) times daily as needed. Start on the third day after chemotherapy. 30 tablet 1  . prochlorperazine (COMPAZINE) 10 MG tablet Take 1 tablet (10 mg total) by mouth every 6 (six) hours as needed (Nausea or vomiting). 30 tablet 1  . venlafaxine XR (EFFEXOR-XR) 37.5 MG 24 hr capsule Take 1 capsule (37.5 mg total) by mouth daily with breakfast. 30 capsule 6   No current facility-administered medications for this visit.     PHYSICAL EXAMINATION: ECOG PERFORMANCE STATUS: 1 - Symptomatic but completely ambulatory  Vitals:   03/30/18 0932  BP: (!) 104/59  Pulse: 65  Resp: 18  Temp: 98.6 F (37 C)  SpO2: 100%   Filed Weights   03/30/18 0932  Weight: 137 lb 1.6 oz (62.2 kg)    GENERAL:alert, no distress and comfortable SKIN: skin color, texture, turgor are normal, no rashes or significant lesions EYES: normal, Conjunctiva are pink and non-injected, sclera clear OROPHARYNX:no exudate, no erythema and lips, buccal mucosa, and tongue normal  NECK:  supple, thyroid normal size, non-tender, without nodularity LYMPH:  no palpable lymphadenopathy in the cervical, axillary or inguinal LUNGS: clear to auscultation and percussion with normal breathing effort HEART: regular rate & rhythm and no murmurs and no lower extremity edema ABDOMEN:abdomen soft, non-tender and normal bowel sounds MUSCULOSKELETAL:no cyanosis of digits and no clubbing  NEURO: alert & oriented x 3 with fluent speech, no focal motor/sensory deficits EXTREMITIES: No lower extremity edema  LABORATORY DATA:  I have reviewed the data as listed CMP Latest Ref Rng & Units 03/30/2018 03/03/2018  Glucose 70 - 99 mg/dL 94 95  BUN 6 - 20 mg/dL 9 9  Creatinine 0.44 - 1.00 mg/dL 0.66 0.63  Sodium 135 - 145 mmol/L 139 138  Potassium 3.5 - 5.1 mmol/L 4.4 5.0  Chloride 98 - 111 mmol/L 105 106  CO2 22 - 32 mmol/L 28 28  Calcium 8.9 - 10.3 mg/dL 9.1 9.4  Total Protein 6.5 - 8.1 g/dL 6.7 6.6  Total Bilirubin 0.3 - 1.2 mg/dL 0.4 0.4  Alkaline Phos 38 - 126 U/L 59 -  AST 15 - 41 U/L 14(L) 12  ALT 0 - 44 U/L 14 11    Lab Results  Component Value Date   WBC 3.6 (L) 03/30/2018   HGB 11.7 03/30/2018   HCT 35.2 03/30/2018   MCV 85.7 03/30/2018   PLT 260 03/30/2018   NEUTROABS 2.2 03/30/2018    ASSESSMENT & PLAN:  Malignant neoplasm of upper-outer quadrant of left breast in female, estrogen receptor negative ([place]) 03/12/2018:Palpable masses in both breasts with family history of breast cancer, breast density category D, ultrasound measured these hypoechoic irregular mass left breast 2.3 cm, right breast no abnormality, biopsy left breast mass: IDC grade 3 ER 0%, PR 0%, HER-2 negative, Ki-67 90%, lymph node biopsy benign: T2N0, stage IIb  Recommendation based on multidisciplinary tumor board: Genetics consultation 1. Neoadjuvant chemotherapy with Adriamycin and Cytoxan dose dense 4 followed by Taxol with carboplatin weekly 12 2. Followed by breast conserving surgery with sentinel  lymph node study vs targeted axillary dissection 3. Followed by adjuvant radiation therapy ---------------------------------------------------------------------------------------------------------------- Current Treatment: Neoadjuvant chemotherapy to start next week. Echocardiogram 03/24/2018: EF 60 to 65% Chemo counseling completed Patient will need to have a CT chest abdomen pelvis.  This will be ordered. She is going to [facility] for second opinion.  She will inform me tomorrow after this appointment whether she wants to receive treatment here or at [facility]. I discussed with her that if there is a clinical trial then she may would be better served by being on the trial.  Return to clinic at the next schedule appointment.      Orders Placed This Encounter  Procedures  . CT Chest W Contrast    Standing Status:   Future    Standing Expiration Date:   03/30/2019    Order Specific Question:   ** REASON FOR EXAM (FREE TEXT)    Answer:   Back pain and abdominal pain with breast cancer triple neg disease    Order Specific Question:   If indicated for the ordered procedure,  I authorize the administration of contrast media per Radiology protocol    Answer:   Yes    Order Specific Question:   Is patient pregnant?    Answer:   No    Order Specific Question:   Preferred imaging location?    Answer:   [facility]    Order Specific Question:   Radiology Contrast Protocol - do NOT remove file path    Answer:   \\[email or web address]  . CT Abdomen Pelvis W Contrast    Standing Status:   Future    Standing Expiration Date:   03/30/2019    Order Specific Question:   ** REASON FOR EXAM (FREE TEXT)    Answer:   Breast cancer triple neg with abdominal pain    Order Specific Question:   If indicated for the ordered procedure, I authorize the administration of contrast media per Radiology protocol    Answer:   Yes    Order Specific Question:   Is patient  pregnant?    Answer:   No    Order Specific Question:   Preferred imaging location?    Answer:   [facility]    Order Specific Question:   Is Oral Contrast requested for this exam?    Answer:   Yes, Per Radiology protocol    Order Specific Question:   Radiology Contrast Protocol - do NOT remove file path    Answer:   \\[email or web address]  . CBC with Differential ([place] Only)    Standing Status:   Standing    Number of Occurrences:   20    Standing Expiration Date:   03/31/2019  . CMP ([place] only)    Standing Status:   Standing    Number of Occurrences:   20    Standing Expiration Date:   03/31/2019  . PHYSICIAN COMMUNICATION ORDER    A baseline Echo/ [place] should be obtained prior to initiation of Anthracycline Chemotherapy   The patient has a good understanding of the overall plan. she agrees with it. she will call with any problems that may develop before the next visit here.   Harriette Ohara, MD 03/30/18

## 2018-03-31 ENCOUNTER — Telehealth: Payer: Self-pay | Admitting: Hematology and Oncology

## 2018-03-31 NOTE — H&P (Signed)
Samantha Mcneil Documented: 03/16/2018 2:06 PM Location: [facility] Patient #: [id number] DOB: 05-11-1986 Married / Language: English / Race: Refused to Report/Unreported Female   History of Present Illness [name] MD; 03/16/2018 2:41 PM) The patient is a 32 year old female who presents with breast cancer. Pt is a 32 yo F referred by Dr. Derrel Nip for a new diagnosis of left breast cancer 03/2018. She presented with self detected palpable masses bilaterally. She also describes a full and heavy feeling with some soreness. Diagnostic mammogram showed no findings on right, ultrasound was not done originally, but then was done and showed no abnormality. Left diagnostic imaging showed 1.7 cm calcs in the UOQ and u/s showed a 2.3 cm mass in the region of the calcs as well as a borderline enlarged LN. The left side underwent core needle biopsy and showed grade 3 invasive ductal carcinoma with DCIS. The lymph node was negative. Prognostic panel is pending.   She has multiple family members who have had breast cancer. Her maternal aunt had breast cancer, did not have treatment, and died young. A maternal cousin (not this aunt's daughter) had breast cancer as well. Her mother had a history of colon polyps. She had menarche at age 28. She is a G2P2 wtih first child born in late teens.   Diagnosis 03/12/2018 1. Breast, left, needle core biopsy, 12 o'clock, 2cmfn - INVASIVE DUCTAL CARCINOMA, GRADE III. SEE NOTE. - DUCTAL CARCINOMA IN SITU, HIGH NUCLEAR GRADE. 2. Lymph node, needle/core biopsy, left axillary - BENIGN LYMPH NODE. - NEGATIVE FOR CARCINOMA.  dx mammo bilateral, LEFT u/s 03/06/2018 ACR Breast Density Category d: The breast tissue is extremely dense, which lowers the sensitivity of mammography.  FINDINGS: A BB has been placed on the upper-outer quadrant of the right breast near the nipple. No suspicious findings are identified deep to the palpable marker. There is another  palpable marker on the upper-outer quadrant of the left breast. In the region of this marker there are coarse heterogeneous calcifications spanning approximately 1.7 cm. No other suspicious calcifications, masses or areas of distortion are seen in the bilateral breasts.  Mammographic images were processed with CAD.  On physical exam, there is a firm palpable lump at the site of concern in the superior left breast. Physical exam of the palpable site in the upper-outer periareolar right breast demonstrates a softer more mobile ridge of tissue without definite mass.  Ultrasound of the left breast was performed demonstrating a heterogeneous hypoechoic irregular mass with internal calcifications measuring approximately 2.3 x 1.3 x 1.5 cm. Blood flow is seen within the mass on color Doppler imaging. Ultrasound of the left axilla demonstrates 1 prominent lymph node with cortex measuring up to 6 mm.  Ultrasound of the right breast was not performed today as we did not have an order for this exam.  IMPRESSION: 1. There is an indeterminate heterogeneous mass in the left breast at 12 o'clock at the palpable site of concern.  2. There is 1 indeterminate left axillary lymph node with cortex measuring up to 6 mm.  3. Incomplete evaluation of the palpable site in the right breast. Ultrasound is necessary to complete this evaluation. No mammographic abnormalities are identified in the right breast.  RECOMMENDATION: 1. Ultrasound-guided biopsy is recommended for the left breast mass and left axillary lymph node. This has been scheduled for 03/12/2018 at 12:45 p.m.  2. Ultrasound of the right breast will be performed the same day of the biopsy to complete evaluation  of the palpable right breast lump.  Right breast ultrasound 03/12/2018 FINDINGS: Targeted ultrasound is performed, showing no focal abnormality over the upper outer right periareolar region to account for patient's palpable  abnormality.  IMPRESSION: No focal abnormality over the upper outer right periareolar region to account for patient's palpable abnormality.  RECOMMENDATION: Recommend continued management of patient's right breast palpable abnormality on a clinical basis. Patient will undergo ultrasound core needle biopsy of the suspicious left breast mass and left axillary lymph node today.   Past Surgical History [name], MD; 03/16/2018 2:12 PM) Breast Biopsy  Left. Cesarean Section - Multiple   Diagnostic Studies History [name], MD; 03/16/2018 2:12 PM) Colonoscopy  never Mammogram  1-3 years ago Pap Smear  1-5 years ago  Allergies (Tanisha A. Owens Shark, [place]; 03/16/2018 2:07 PM) No Known Drug Allergies [03/16/2018]: Allergies Reconciled   Medication History (Tanisha A. Owens Shark, [place]; 03/16/2018 2:07 PM) No Current Medications Medications Reconciled  Social History [name], MD; 03/16/2018 2:12 PM) Alcohol use  Occasional alcohol use. Caffeine use  Coffee. No drug use  Tobacco use  Never smoker.  Family History [name], MD; 03/16/2018 2:12 PM) Alcohol Abuse  Father. Breast Cancer  Family Members In General. Cerebrovascular Accident  Father. Colon Polyps  Mother. Depression  Father. Heart Disease  Father. Hypertension  Father, Mother. Migraine Headache  Family Members In General.  Pregnancy / Birth History [name], MD; 03/16/2018 2:12 PM) Age at menarche  55 years. Contraceptive History  Intrauterine device. Gravida  2 Maternal age  78-20 Para  2 Regular periods   Other Problems [name], MD; 03/16/2018 2:12 PM) Kidney Stone     Review of Systems [name] MD; 03/16/2018 2:12 PM) General Present- Night Sweats. Not Present- Appetite Loss, Chills, Fatigue, Fever, Weight Gain and Weight Loss. Cardiovascular Present- Palpitations. Not Present- Chest Pain, Difficulty Breathing Lying Down, Leg Cramps, Rapid Heart Rate, Shortness  of Breath and Swelling of Extremities. Gastrointestinal Present- Abdominal Pain and Nausea. Not Present- Bloating, Bloody Stool, Change in Bowel Habits, Chronic diarrhea, Constipation, Difficulty Swallowing, Excessive gas, Gets full quickly at meals, Hemorrhoids, Indigestion, Rectal Pain and Vomiting. Neurological Present- Headaches. Not Present- Decreased Memory, Fainting, Numbness, Seizures, Tingling, Tremor, Trouble walking and Weakness. Psychiatric Present- Anxiety. Not Present- Bipolar, Change in Sleep Pattern, Depression, Fearful and Frequent crying.  Vitals (Tanisha A. Brown RMA; 03/16/2018 2:07 PM) 03/16/2018 2:06 PM Weight: 137.8 lb Height: 63in Body Surface Area: 1.65 m Body Mass Index: 24.41 kg/m  Temp.: 98.24F  Pulse: 81 (Regular)  BP: 122/82 (Sitting, Left Arm, Standard)       Physical Exam [name] MD; 03/16/2018 2:42 PM) General Mental Status-Alert. General Appearance-Consistent with stated age. Hydration-Well hydrated. Voice-Normal.  Head and Neck Head-normocephalic, atraumatic with no lesions or palpable masses. Trachea-midline. Thyroid Gland Characteristics - normal size and consistency.  Eye Eyeball - Bilateral-Extraocular movements intact. Sclera/Conjunctiva - Bilateral-No scleral icterus.  Chest and Lung Exam Chest and lung exam reveals -quiet, even and easy respiratory effort with no use of accessory muscles and on auscultation, normal breath sounds, no adventitious sounds and normal vocal resonance. Inspection Chest Wall - Normal. Back - normal.  Breast Note: dense breasts bilaterally. symmetric. palpable mass at 12-1 o'clock around 2 cm in diameter, anterior in left breast. mobile. tender. faint bruising. no LAD. Right breast wtih 8 mm firm mass near areolar border. no skin dimpling, no erythema.   Cardiovascular Cardiovascular examination reveals -normal heart sounds, regular rate and rhythm with no murmurs and  normal pedal pulses bilaterally.  Abdomen Inspection Inspection of the abdomen reveals - No Hernias. Palpation/Percussion Palpation and Percussion of the abdomen reveal - Soft, Non Tender, No Rebound tenderness, No Rigidity (guarding) and No hepatosplenomegaly. Auscultation Auscultation of the abdomen reveals - Bowel sounds normal.  Neurologic Neurologic evaluation reveals -alert and oriented x 3 with no impairment of recent or remote memory. Mental Status-Normal.  Musculoskeletal Global Assessment -Note: no gross deformities.  Normal Exam - Left-Upper Extremity Strength Normal and Lower Extremity Strength Normal. Normal Exam - Right-Upper Extremity Strength Normal and Lower Extremity Strength Normal.  Lymphatic Head & Neck  General Head & Neck Lymphatics: Bilateral - Description - Normal. Axillary  General Axillary Region: Bilateral - Description - Normal. Tenderness - Non Tender. Femoral & Inguinal  Generalized Femoral & Inguinal Lymphatics: Bilateral - Description - No Generalized lymphadenopathy.    Assessment & Plan [name] MD; 03/16/2018 2:46 PM) PRIMARY CANCER OF UPPER OUTER QUADRANT OF LEFT FEMALE BREAST (C50.412) Impression: Patient presents wtih at least a cT2N0 left breast cancer. We still do not have prognostic panel information. She may need chemo which could be given up front or post surgery. She is young with two family members having breast cancer, so I will refer her to genetics. I will also go ahead and send her to see oncology.  She will need an MRI due to the density of her breasts, her age, family history, and the need for further workup on the right breast mass.  Once we have this information, we will make a surgical plan. I discussed that we would determine that we recommend mastectomy (unilateral or bilateral), we will refer her to plastic surgery. Current Plans Referred to Oncology, for evaluation and follow up  (Oncology). Routine. MR BREAST BILAT W CON (63893) (palpable right breast mass without u/s or mammogram correlate. left breast cancer, age 85, breast density D.) FAMILY HISTORY OF BREAST CANCER IN FEMALE (Z80.3) Impression: See above. Current Plans Referred to Genetic Counseling, for evaluation and follow up (Medical Genetics). Routine. BREAST MASS, RIGHT (N63.10) Impression: See above.  MRI, then biopsy.    Signed by [name], MD (03/16/2018 2:47 PM)

## 2018-03-31 NOTE — Telephone Encounter (Signed)
LVm for pt regarding upcoming appts

## 2018-03-31 NOTE — Progress Notes (Signed)
Please let the patient know that no additional cancer is seen other than the mass we know about.

## 2018-03-31 NOTE — Progress Notes (Signed)
Ensure Pre-surgery drink given to family member with instructions to complete by 0700 DOS.  Surgical soap also given with instructions for use.  Family member verbalized understanding.

## 2018-04-01 ENCOUNTER — Ambulatory Visit (HOSPITAL_BASED_OUTPATIENT_CLINIC_OR_DEPARTMENT_OTHER): Payer: BLUE CROSS/BLUE SHIELD | Admitting: Anesthesiology

## 2018-04-01 ENCOUNTER — Ambulatory Visit: Payer: BLUE CROSS/BLUE SHIELD | Admitting: Hematology and Oncology

## 2018-04-01 ENCOUNTER — Other Ambulatory Visit: Payer: BLUE CROSS/BLUE SHIELD

## 2018-04-01 ENCOUNTER — Encounter (HOSPITAL_BASED_OUTPATIENT_CLINIC_OR_DEPARTMENT_OTHER)
Admission: RE | Disposition: A | Discharge status: Home / Self Care | Payer: Self-pay | Source: Ambulatory Visit | Attending: General Surgery

## 2018-04-01 ENCOUNTER — Ambulatory Visit (HOSPITAL_COMMUNITY): Payer: BLUE CROSS/BLUE SHIELD

## 2018-04-01 ENCOUNTER — Other Ambulatory Visit: Payer: Self-pay

## 2018-04-01 ENCOUNTER — Encounter (HOSPITAL_BASED_OUTPATIENT_CLINIC_OR_DEPARTMENT_OTHER): Payer: Self-pay | Admitting: Anesthesiology

## 2018-04-01 ENCOUNTER — Ambulatory Visit (HOSPITAL_BASED_OUTPATIENT_CLINIC_OR_DEPARTMENT_OTHER)
Admission: RE | Admit: 2018-04-01 | Discharge: 2018-04-01 | Disposition: A | Discharge status: Home / Self Care | Payer: BLUE CROSS/BLUE SHIELD | Source: Ambulatory Visit | Attending: General Surgery | Admitting: General Surgery

## 2018-04-01 DIAGNOSIS — C50412 Malignant neoplasm of upper-outer quadrant of left female breast: Secondary | ICD-10-CM | POA: Diagnosis not present

## 2018-04-01 DIAGNOSIS — Z803 Family history of malignant neoplasm of breast: Secondary | ICD-10-CM | POA: Insufficient documentation

## 2018-04-01 DIAGNOSIS — Z95828 Presence of other vascular implants and grafts: Secondary | ICD-10-CM

## 2018-04-01 HISTORY — DX: Anxiety disorder, unspecified: F41.9

## 2018-04-01 HISTORY — DX: Major depressive disorder, single episode, unspecified: F32.9

## 2018-04-01 HISTORY — DX: Headache, unspecified: R51.9

## 2018-04-01 HISTORY — DX: Personal history of urinary calculi: Z87.442

## 2018-04-01 HISTORY — DX: Headache: R51

## 2018-04-01 HISTORY — PX: PORTACATH PLACEMENT: SHX2246

## 2018-04-01 HISTORY — DX: Depression, unspecified: F32.A

## 2018-04-01 SURGERY — INSERTION, TUNNELED CENTRAL VENOUS DEVICE, WITH PORT
Anesthesia: General | Site: Chest | Laterality: Left

## 2018-04-01 MED ORDER — LACTATED RINGERS IV SOLN
INTRAVENOUS | Status: DC
  Administered 2018-04-01: 11:00:00 via INTRAVENOUS

## 2018-04-01 MED ORDER — OXYCODONE HCL 5 MG PO TABS: 5.0000 mg | ORAL_TABLET | Freq: Once | ORAL | Status: DC | PRN

## 2018-04-01 MED ORDER — ACETAMINOPHEN 500 MG PO TABS
ORAL_TABLET | ORAL | Status: AC
  Filled 2018-04-01: qty 2

## 2018-04-01 MED ORDER — ACETAMINOPHEN 500 MG PO TABS
1000.0000 mg | ORAL_TABLET | ORAL | Status: AC
  Administered 2018-04-01: 1000 mg via ORAL

## 2018-04-01 MED ORDER — MIDAZOLAM HCL 2 MG/2ML IJ SOLN
INTRAMUSCULAR | Status: AC
  Filled 2018-04-01: qty 2

## 2018-04-01 MED ORDER — CEFAZOLIN SODIUM-DEXTROSE 2-4 GM/100ML-% IV SOLN
2.0000 g | INTRAVENOUS | Status: AC
  Administered 2018-04-01: 2 g via INTRAVENOUS

## 2018-04-01 MED ORDER — HEPARIN (PORCINE) IN NACL 2-0.9 UNITS/ML
INTRAMUSCULAR | Status: AC | PRN
  Administered 2018-04-01: 1 via INTRAVENOUS

## 2018-04-01 MED ORDER — CHLORHEXIDINE GLUCONATE CLOTH 2 % EX PADS: 6.0000 | MEDICATED_PAD | Freq: Once | CUTANEOUS | Status: DC

## 2018-04-01 MED ORDER — FENTANYL CITRATE (PF) 100 MCG/2ML IJ SOLN: 25.0000 ug | INTRAMUSCULAR | Status: DC | PRN

## 2018-04-01 MED ORDER — FENTANYL CITRATE (PF) 100 MCG/2ML IJ SOLN
INTRAMUSCULAR | Status: AC
  Filled 2018-04-01: qty 2

## 2018-04-01 MED ORDER — PROMETHAZINE HCL 25 MG/ML IJ SOLN: 6.2500 mg | INTRAMUSCULAR | Status: DC | PRN

## 2018-04-01 MED ORDER — OXYCODONE HCL 5 MG PO TABS: 5.0000 mg | ORAL_TABLET | Freq: Four times a day (QID) | ORAL | 0 refills | Status: DC | PRN

## 2018-04-01 MED ORDER — GABAPENTIN 300 MG PO CAPS
ORAL_CAPSULE | ORAL | Status: AC
  Filled 2018-04-01: qty 1

## 2018-04-01 MED ORDER — FENTANYL CITRATE (PF) 100 MCG/2ML IJ SOLN
50.0000 ug | INTRAMUSCULAR | Status: DC | PRN
  Administered 2018-04-01 (×2): 50 ug via INTRAVENOUS

## 2018-04-01 MED ORDER — SCOPOLAMINE 1 MG/3DAYS TD PT72: 1.0000 | MEDICATED_PATCH | Freq: Once | TRANSDERMAL | Status: DC | PRN

## 2018-04-01 MED ORDER — LIDOCAINE HCL (CARDIAC) PF 100 MG/5ML IV SOSY
PREFILLED_SYRINGE | INTRAVENOUS | Status: DC | PRN
  Administered 2018-04-01: 100 mg via INTRAVENOUS

## 2018-04-01 MED ORDER — GABAPENTIN 300 MG PO CAPS
300.0000 mg | ORAL_CAPSULE | ORAL | Status: AC
  Administered 2018-04-01: 300 mg via ORAL

## 2018-04-01 MED ORDER — HEPARIN SOD (PORK) LOCK FLUSH 100 UNIT/ML IV SOLN
INTRAVENOUS | Status: DC | PRN
  Administered 2018-04-01: 500 [IU] via INTRAVENOUS

## 2018-04-01 MED ORDER — LIDOCAINE-EPINEPHRINE (PF) 1 %-1:200000 IJ SOLN
INTRAMUSCULAR | Status: DC | PRN
  Administered 2018-04-01: 9 mL

## 2018-04-01 MED ORDER — OXYCODONE HCL 5 MG/5ML PO SOLN: 5.0000 mg | Freq: Once | ORAL | Status: DC | PRN

## 2018-04-01 MED ORDER — DEXAMETHASONE SODIUM PHOSPHATE 4 MG/ML IJ SOLN
INTRAMUSCULAR | Status: DC | PRN
  Administered 2018-04-01: 10 mg via INTRAVENOUS

## 2018-04-01 MED ORDER — PROPOFOL 10 MG/ML IV BOLUS
INTRAVENOUS | Status: DC | PRN
  Administered 2018-04-01: 150 mg via INTRAVENOUS

## 2018-04-01 MED ORDER — CEFAZOLIN SODIUM-DEXTROSE 2-4 GM/100ML-% IV SOLN
INTRAVENOUS | Status: AC
  Filled 2018-04-01: qty 100

## 2018-04-01 MED ORDER — MEPERIDINE HCL 25 MG/ML IJ SOLN: 6.2500 mg | INTRAMUSCULAR | Status: DC | PRN

## 2018-04-01 MED ORDER — MIDAZOLAM HCL 2 MG/2ML IJ SOLN
1.0000 mg | INTRAMUSCULAR | Status: DC | PRN
  Administered 2018-04-01: 2 mg via INTRAVENOUS

## 2018-04-01 MED FILL — oxyCODONE HCL 5 MG TABS: 5 | 3 days supply | Qty: 10 | Fill #0

## 2018-04-01 SURGICAL SUPPLY — 41 items
BAG DECANTER FOR FLEXI CONT (MISCELLANEOUS) ×2 IMPLANT
BLADE HEX COATED 2.75 (ELECTRODE) ×2 IMPLANT
BLADE SURG 11 STRL SS (BLADE) ×2 IMPLANT
BLADE SURG 15 STRL LF DISP TIS (BLADE) ×1 IMPLANT
BLADE SURG 15 STRL SS (BLADE) ×1
CHLORAPREP W/TINT 26ML (MISCELLANEOUS) ×2 IMPLANT
COVER BACK TABLE 60X90IN (DRAPES) ×2 IMPLANT
COVER MAYO STAND STRL (DRAPES) ×2 IMPLANT
DECANTER SPIKE VIAL GLASS SM (MISCELLANEOUS) IMPLANT
DERMABOND ADVANCED (GAUZE/BANDAGES/DRESSINGS) ×1
DERMABOND ADVANCED .7 DNX12 (GAUZE/BANDAGES/DRESSINGS) ×1 IMPLANT
DRAPE C-ARM 42X72 X-RAY (DRAPES) ×2 IMPLANT
DRAPE LAPAROTOMY TRNSV 102X78 (DRAPE) ×2 IMPLANT
DRAPE UTILITY XL STRL (DRAPES) ×2 IMPLANT
DRSG TEGADERM 4X4.75 (GAUZE/BANDAGES/DRESSINGS) ×4 IMPLANT
ELECT REM PT RETURN 9FT ADLT (ELECTROSURGICAL) ×2
ELECTRODE REM PT RTRN 9FT ADLT (ELECTROSURGICAL) ×1 IMPLANT
GAUZE SPONGE 4X4 12PLY STRL LF (GAUZE/BANDAGES/DRESSINGS) IMPLANT
GLOVE BIO SURGEON STRL SZ 6 (GLOVE) ×2 IMPLANT
GLOVE BIO SURGEON STRL SZ 6.5 (GLOVE) ×2 IMPLANT
GLOVE BIOGEL PI IND STRL 6.5 (GLOVE) ×1 IMPLANT
GLOVE BIOGEL PI INDICATOR 6.5 (GLOVE) ×1
GOWN STRL REUS W/ TWL LRG LVL3 (GOWN DISPOSABLE) ×1 IMPLANT
GOWN STRL REUS W/TWL 2XL LVL3 (GOWN DISPOSABLE) ×2 IMPLANT
GOWN STRL REUS W/TWL LRG LVL3 (GOWN DISPOSABLE) ×1
IV CONNECTOR ONE LINK NDLESS (IV SETS) ×4 IMPLANT
KIT PORT POWER 8FR ISP CVUE (Port) ×2 IMPLANT
NEEDLE HYPO 25X1 1.5 SAFETY (NEEDLE) ×2 IMPLANT
PACK BASIN DAY SURGERY FS (CUSTOM PROCEDURE TRAY) ×2 IMPLANT
PENCIL BUTTON HOLSTER BLD 10FT (ELECTRODE) ×2 IMPLANT
SLEEVE SCD COMPRESS KNEE MED (MISCELLANEOUS) ×2 IMPLANT
SUT MNCRL AB 4-0 PS2 18 (SUTURE) ×2 IMPLANT
SUT PROLENE 2 0 SH DA (SUTURE) ×4 IMPLANT
SUT VIC AB 3-0 SH 27 (SUTURE) ×1
SUT VIC AB 3-0 SH 27X BRD (SUTURE) ×1 IMPLANT
SUT VICRYL 3-0 CR8 SH (SUTURE) IMPLANT
SYR 10ML LL (SYRINGE) ×2 IMPLANT
SYR 5ML LUER SLIP (SYRINGE) ×2 IMPLANT
SYR CONTROL 10ML LL (SYRINGE) ×2 IMPLANT
TOWEL GREEN STERILE FF (TOWEL DISPOSABLE) ×2 IMPLANT
TOWEL OR NON WOVEN STRL DISP B (DISPOSABLE) IMPLANT

## 2018-04-01 NOTE — Anesthesia Postprocedure Evaluation (Signed)
Anesthesia Post Note  Patient: Samantha Mcneil  Procedure(s) Performed: INSERTION PORT-A-CATH (Left Chest)     Patient location during evaluation: PACU Anesthesia Type: General Level of consciousness: sedated and patient cooperative Pain management: pain level controlled Vital Signs Assessment: post-procedure vital signs reviewed and stable Respiratory status: spontaneous breathing Cardiovascular status: stable Anesthetic complications: no    Last Vitals:  Vitals:   04/01/18 1300 04/01/18 1315  BP: 106/86 111/75  Pulse: 75 70  Resp: 13 (!) 28  Temp:    SpO2: 98% 100%    Last Pain:  Vitals:   04/01/18 1300  TempSrc:   PainSc: 0-No pain                 Nolon Nations

## 2018-04-01 NOTE — Anesthesia Preprocedure Evaluation (Signed)
Anesthesia Evaluation  Patient identified by MRN, date of birth, ID band Patient awake    Reviewed: Allergy & Precautions, NPO status , Patient's Chart, lab work & pertinent test results  Airway Mallampati: I  TM Distance: >3 FB Neck ROM: Full    Dental  (+) Dental Advisory Given, Teeth Intact   Pulmonary neg pulmonary ROS,    Pulmonary exam normal breath sounds clear to auscultation       Cardiovascular negative cardio ROS Normal cardiovascular exam Rhythm:Regular Rate:Normal     Neuro/Psych negative neurological ROS  negative psych ROS   GI/Hepatic negative GI ROS, Neg liver ROS,   Endo/Other  negative endocrine ROS  Renal/GU negative Renal ROS     Musculoskeletal negative musculoskeletal ROS (+)   Abdominal   Peds  Hematology negative hematology ROS (+)   Anesthesia Other Findings   Reproductive/Obstetrics negative OB ROS                             Anesthesia Physical Anesthesia Plan  ASA: II  Anesthesia Plan: General   Post-op Pain Management:    Induction: Intravenous  PONV Risk Score and Plan: 3 and Ondansetron, Dexamethasone and Midazolam  Airway Management Planned: LMA  Additional Equipment:   Intra-op Plan:   Post-operative Plan: Extubation in OR  Informed Consent: I have reviewed the patients History and Physical, chart, labs and discussed the procedure including the risks, benefits and alternatives for the proposed anesthesia with the patient or authorized representative who has indicated his/her understanding and acceptance.   Dental advisory given  Plan Discussed with: CRNA  Anesthesia Plan Comments:         Anesthesia Quick Evaluation

## 2018-04-01 NOTE — Transfer of Care (Signed)
Immediate Anesthesia Transfer of Care Note  Patient: Samantha Mcneil  Procedure(s) Performed: INSERTION PORT-A-CATH (Left Chest)  Patient Location: PACU  Anesthesia Type:General  Level of Consciousness: sedated  Airway & Oxygen Therapy: Patient Spontanous Breathing and Patient connected to face mask oxygen  Post-op Assessment: Report given to RN and Post -op Vital signs reviewed and stable  Post vital signs: Reviewed and stable  Last Vitals:  Vitals Value Taken Time  BP    Temp    Pulse 92 04/01/2018 12:34 PM  Resp 15 04/01/2018 12:34 PM  SpO2 100 % 04/01/2018 12:34 PM  Vitals shown include unvalidated device data.  Last Pain:  Vitals:   04/01/18 1113  TempSrc: Oral  PainSc: 0-No pain      Patients Stated Pain Goal: 0 (65/99/35 [id number])  Complications: No apparent anesthesia complications

## 2018-04-01 NOTE — Op Note (Signed)
PREOPERATIVE DIAGNOSIS:  Left breast cancer     POSTOPERATIVE DIAGNOSIS:  Same     PROCEDURE: Left subclavian port placement, Bard ClearVue Power Port, MRI safe, 8-French.      SURGEON:  [name], MD      ANESTHESIA:  General   FINDINGS:  Good venous return, easy flush, and tip of the catheter and   SVC 20 cm.      SPECIMEN:  None.      ESTIMATED BLOOD LOSS:  Minimal.      COMPLICATIONS:  None known.      PROCEDURE:  Pt was identified in the holding area and taken to   the operating room, where patient was placed supine on the operating room   table.  General anesthesia was induced.  Patient's arms were tucked and the upper   chest and neck were prepped and draped in sterile fashion.  Time-out was   performed according to the surgical safety check list.  When all was   correct, we continued.   Local anesthetic was administered over this   area at the angle of the clavicle.  The vein was accessed with 1 pass(es) of the needle. There was good venous return and the wire passed easily with no ectopy.   Fluoroscopy was used to confirm that the wire was in the vena cava.      The patient was placed back level and the area for the pocket was anethetized   with local anesthetic.  A 3-cm transverse incision was made with a #15   blade.  Cautery was used to divide the subcutaneous tissues down to the   pectoralis muscle.  An [employer] retractor was used to elevate the skin   while a pocket was created on top of the pectoralis fascia.  The port   was placed into the pocket to confirm that it was of adequate size.  The   catheter was preattached to the port.  The port was then secured to the   pectoralis fascia with four 2-0 Prolene sutures.  These were clamped and   not tied down yet.    The catheter was tunneled through to the wire exit   site.  The catheter was placed along the wire to determine what length it should be to be in the SVC.  The catheter was cut at 20 cm.  The tunneler  sheath and dilator were passed over the wire and the dilator and wire were removed.  The catheter was advanced through the tunneler sheath and the tunneler sheath was pulled away.  Care was taken to keep the catheter in the tunneler sheath as this occurred. This was advanced and the tunneler sheath was removed.  There was good venous   return and easy flush of the catheter.  The Prolene sutures were tied   down to the pectoral fascia.  The skin was reapproximated using 3-0   Vicryl interrupted deep dermal sutures.    Fluoroscopy was used to re-confirm good position of the catheter.  The skin   was then closed using 4-0 Monocryl in a subcuticular fashion.  The port was flushed with concentrated heparin flush as well.  The wounds were then cleaned, dried, and dressed with Dermabond.  The patient was awakened from anesthesia and taken to the PACU in stable condition.  Needle, sponge, and instrument counts were correct.               [name], MD

## 2018-04-01 NOTE — Interval H&P Note (Signed)
History and Physical Interval Note:  04/01/2018 11:17 AM  Samantha Mcneil  has presented today for surgery, with the diagnosis of LEFT BREAST CANCER  The various methods of treatment have been discussed with the patient and family. After consideration of risks, benefits and other options for treatment, the patient has consented to  Procedure(s): INSERTION PORT-A-CATH (N/A) as a surgical intervention .  The patient's history has been reviewed, patient examined, no change in status, stable for surgery.  I have reviewed the patient's chart and labs.  Questions were answered to the patient's satisfaction.     [name]

## 2018-04-01 NOTE — Discharge Instructions (Addendum)
[facility] °Office Phone Number [phone] ° ° POST OP INSTRUCTIONS ° °Always review your discharge instruction sheet given to you by the facility where your surgery was performed. ° °IF YOU HAVE DISABILITY OR FAMILY LEAVE FORMS, YOU MUST BRING THEM TO THE OFFICE FOR PROCESSING.  DO NOT GIVE THEM TO YOUR DOCTOR. ° °1. A prescription for pain medication may be given to you upon discharge.  Take your pain medication as prescribed, if needed.  If narcotic pain medicine is not needed, then you may take acetaminophen (Tylenol) or ibuprofen (Advil) as needed. °2. Take your usually prescribed medications unless otherwise directed °3. If you need a refill on your pain medication, please contact your pharmacy.  They will contact our office to request authorization.  Prescriptions will not be filled after 5pm or on week-ends. °4. You should eat very light the first 24 hours after surgery, such as soup, crackers, pudding, etc.  Resume your normal diet the day after surgery °5. It is common to experience some constipation if taking pain medication after surgery.  Increasing fluid intake and taking a stool softener will usually help or prevent this problem from occurring.  A mild laxative (Milk of Magnesia or Miralax) should be taken according to package directions if there are no bowel movements after 48 hours. °6. You may shower in 48 hours.  The surgical glue will flake off in 2-3 weeks.   °7. ACTIVITIES:  No strenuous activity or heavy lifting for 1 week.   °a. You may drive when you no longer are taking prescription pain medication, you can comfortably wear a seatbelt, and you can safely maneuver your car and apply brakes. °b. RETURN TO WORK:  __________1 week if applicable_______________ °You should see your doctor in the office for a follow-up appointment approximately three-four weeks after your surgery.   ° °WHEN TO CALL YOUR DOCTOR: °1. Fever over 101.0 °2. Nausea and/or vomiting. °3. Extreme  swelling or bruising. °4. Continued bleeding from incision. °5. Increased pain, redness, or drainage from the incision. ° °The clinic staff is available to answer your questions during regular business hours.  Please don’t hesitate to call and ask to speak to one of the nurses for clinical concerns.  If you have a medical emergency, go to the nearest emergency room or call 911.  A surgeon from [facility] is always on call at the hospital. ° °For further questions, please visit [email or web address]  ° °Post Anesthesia Home Care Instructions ° °Activity: °Get plenty of rest for the remainder of the day. A responsible individual must stay with you for 24 hours following the procedure.  °For the next 24 hours, DO NOT: °-Drive a car °-Operate machinery °-Drink alcoholic beverages °-Take any medication unless instructed by your physician °-Make any legal decisions or sign important papers. ° °Meals: °Start with liquid foods such as gelatin or soup. Progress to regular foods as tolerated. Avoid greasy, spicy, heavy foods. If nausea and/or vomiting occur, drink only clear liquids until the nausea and/or vomiting subsides. Call your physician if vomiting continues. ° °Special Instructions/Symptoms: °Your throat may feel dry or sore from the anesthesia or the breathing tube placed in your throat during surgery. If this causes discomfort, gargle with warm salt water. The discomfort should disappear within 24 hours. ° °If you had a scopolamine patch placed behind your ear for the management of post- operative nausea and/or vomiting: ° °1. The medication in the patch is effective for 72 hours, after which it   should be removed.  Wrap patch in a tissue and discard in the trash. Wash hands thoroughly with soap and water. °2. You may remove the patch earlier than 72 hours if you experience unpleasant side effects which may include dry mouth, dizziness or visual disturbances. °3. Avoid touching the patch. Wash  your hands with soap and water after contact with the patch. °   ° °

## 2018-04-01 NOTE — Anesthesia Procedure Notes (Signed)
Procedure Name: LMA Insertion Date/Time: 04/01/2018 11:53 AM Performed by: Willa Frater, CRNA Pre-anesthesia Checklist: Patient identified, Emergency Drugs available, Suction available and Patient being monitored Patient Re-evaluated:Patient Re-evaluated prior to induction Oxygen Delivery Method: Circle system utilized Preoxygenation: Pre-oxygenation with 100% oxygen Induction Type: IV induction Ventilation: Mask ventilation without difficulty LMA: LMA inserted LMA Size: 3.0 Number of attempts: 1 Airway Equipment and Method: Bite block Placement Confirmation: positive ETCO2 Tube secured with: Tape Dental Injury: Teeth and Oropharynx as per pre-operative assessment

## 2018-04-02 ENCOUNTER — Inpatient Hospital Stay: Payer: BLUE CROSS/BLUE SHIELD | Attending: Hematology and Oncology

## 2018-04-02 ENCOUNTER — Encounter: Payer: Self-pay | Admitting: Hematology and Oncology

## 2018-04-02 ENCOUNTER — Other Ambulatory Visit: Payer: Self-pay | Admitting: *Deleted

## 2018-04-02 ENCOUNTER — Encounter: Payer: Self-pay | Admitting: *Deleted

## 2018-04-02 ENCOUNTER — Encounter (HOSPITAL_BASED_OUTPATIENT_CLINIC_OR_DEPARTMENT_OTHER): Payer: Self-pay | Admitting: General Surgery

## 2018-04-02 VITALS — BP 110/76 | HR 78 | Temp 98.6°F | Resp 18

## 2018-04-02 DIAGNOSIS — Z5111 Encounter for antineoplastic chemotherapy: Secondary | ICD-10-CM | POA: Diagnosis not present

## 2018-04-02 DIAGNOSIS — R51 Headache: Secondary | ICD-10-CM

## 2018-04-02 DIAGNOSIS — Z1509 Genetic susceptibility to other malignant neoplasm: Secondary | ICD-10-CM | POA: Diagnosis not present

## 2018-04-02 DIAGNOSIS — C50412 Malignant neoplasm of upper-outer quadrant of left female breast: Secondary | ICD-10-CM | POA: Diagnosis not present

## 2018-04-02 DIAGNOSIS — D701 Agranulocytosis secondary to cancer chemotherapy: Secondary | ICD-10-CM | POA: Insufficient documentation

## 2018-04-02 DIAGNOSIS — Z171 Estrogen receptor negative status [ER-]: Secondary | ICD-10-CM | POA: Diagnosis not present

## 2018-04-02 DIAGNOSIS — R1110 Vomiting, unspecified: Secondary | ICD-10-CM | POA: Insufficient documentation

## 2018-04-02 DIAGNOSIS — R519 Headache, unspecified: Secondary | ICD-10-CM

## 2018-04-02 DIAGNOSIS — R509 Fever, unspecified: Secondary | ICD-10-CM | POA: Diagnosis not present

## 2018-04-02 DIAGNOSIS — F419 Anxiety disorder, unspecified: Secondary | ICD-10-CM | POA: Insufficient documentation

## 2018-04-02 DIAGNOSIS — Z5189 Encounter for other specified aftercare: Secondary | ICD-10-CM | POA: Insufficient documentation

## 2018-04-02 DIAGNOSIS — F329 Major depressive disorder, single episode, unspecified: Secondary | ICD-10-CM | POA: Diagnosis not present

## 2018-04-02 DIAGNOSIS — N898 Other specified noninflammatory disorders of vagina: Secondary | ICD-10-CM | POA: Diagnosis not present

## 2018-04-02 DIAGNOSIS — Z79899 Other long term (current) drug therapy: Secondary | ICD-10-CM | POA: Diagnosis not present

## 2018-04-02 MED ORDER — ACETAMINOPHEN 325 MG PO TABS: 650.0000 mg | ORAL_TABLET | Freq: Once | ORAL | Status: DC

## 2018-04-02 MED ORDER — PALONOSETRON HCL INJECTION 0.25 MG/5ML
INTRAVENOUS | Status: AC
  Filled 2018-04-02: qty 5

## 2018-04-02 MED ORDER — SODIUM CHLORIDE 0.9 % IV SOLN
Freq: Once | INTRAVENOUS | Status: AC
  Administered 2018-04-02: 10:00:00 via INTRAVENOUS
  Filled 2018-04-02: qty 5

## 2018-04-02 MED ORDER — DOXORUBICIN HCL CHEMO IV INJECTION 2 MG/ML
60.0000 mg/m2 | Freq: Once | INTRAVENOUS | Status: AC
  Administered 2018-04-02: 100 mg via INTRAVENOUS
  Filled 2018-04-02: qty 50

## 2018-04-02 MED ORDER — SODIUM CHLORIDE 0.9 % IV SOLN
600.0000 mg/m2 | Freq: Once | INTRAVENOUS | Status: AC
  Administered 2018-04-02: 1000 mg via INTRAVENOUS
  Filled 2018-04-02: qty 50

## 2018-04-02 MED ORDER — ACETAMINOPHEN 325 MG PO TABS
650.0000 mg | ORAL_TABLET | ORAL | Status: AC
  Administered 2018-04-02: 650 mg via ORAL

## 2018-04-02 MED ORDER — PALONOSETRON HCL INJECTION 0.25 MG/5ML
0.2500 mg | Freq: Once | INTRAVENOUS | Status: AC
  Administered 2018-04-02: 0.25 mg via INTRAVENOUS

## 2018-04-02 MED ORDER — SODIUM CHLORIDE 0.9% FLUSH
10.0000 mL | INTRAVENOUS | Status: DC | PRN
  Administered 2018-04-02: 10 mL
  Filled 2018-04-02: qty 10

## 2018-04-02 MED ORDER — SODIUM CHLORIDE 0.9 % IV SOLN
Freq: Once | INTRAVENOUS | Status: AC
  Administered 2018-04-02: 09:00:00 via INTRAVENOUS
  Filled 2018-04-02: qty 250

## 2018-04-02 MED ORDER — HEPARIN SOD (PORK) LOCK FLUSH 100 UNIT/ML IV SOLN
500.0000 [IU] | Freq: Once | INTRAVENOUS | Status: AC | PRN
  Administered 2018-04-02: 500 [IU]
  Filled 2018-04-02: qty 5

## 2018-04-02 MED ORDER — ACETAMINOPHEN 325 MG PO TABS
ORAL_TABLET | ORAL | Status: AC
  Filled 2018-04-02: qty 2

## 2018-04-02 NOTE — Progress Notes (Signed)
Met with patient once done with infusion to introduce myself in person and offer resources available to her. Patient's spouse brought proof of income earlier this morning.  Advised patient she was approved for copay assistance for Udenyca through [employer] complete and after insurance pays, she will be left with a $0 copay. She verbalized understanding.  Applied for copay assistance for other chemo drugs through [employer]. Patient approved for a guaranteed amount of $2500 with access to additional $2500 without additional paperwork. Advised patient she will receive a copy of this approval via mail but we will monitor her account to submit expenditures once available and she will only receive bill for charges not covered by this grant. She was very appreciative and verbalized understanding. Obtained physician signature and uploaded on the portal and faxed to [employer].PAF paperwork given to [facility] for billing/copay monitoring.   Approved patient for one-time $1000 [employer] grant to assist with medication, transportation and other personal expenses while going through treatment. She was given a copy of the expense sheet as well as outpatient pharmacy information. She has my card for any additional financial questions or concerns.

## 2018-04-02 NOTE — Progress Notes (Signed)
Headache reported . (Took ibuprophen for same at 0500 and it was relieved). Tylenol given per Dr Lindi Adie order and headache diminished a little bit , not completely gone at discharge.

## 2018-04-03 ENCOUNTER — Inpatient Hospital Stay: Payer: BLUE CROSS/BLUE SHIELD

## 2018-04-03 VITALS — BP 118/66 | HR 79 | Temp 97.8°F | Resp 18

## 2018-04-03 DIAGNOSIS — C50412 Malignant neoplasm of upper-outer quadrant of left female breast: Secondary | ICD-10-CM | POA: Diagnosis not present

## 2018-04-03 DIAGNOSIS — Z171 Estrogen receptor negative status [ER-]: Principal | ICD-10-CM

## 2018-04-03 MED ORDER — PEGFILGRASTIM-CBQV 6 MG/0.6ML ~~LOC~~ SOSY
PREFILLED_SYRINGE | SUBCUTANEOUS | Status: AC
  Filled 2018-04-03: qty 0.6

## 2018-04-03 MED ORDER — PEGFILGRASTIM-CBQV 6 MG/0.6ML ~~LOC~~ SOSY
6.0000 mg | PREFILLED_SYRINGE | Freq: Once | SUBCUTANEOUS | Status: AC
  Administered 2018-04-03: 6 mg via SUBCUTANEOUS

## 2018-04-06 ENCOUNTER — Ambulatory Visit (HOSPITAL_COMMUNITY)
Admission: RE | Admit: 2018-04-06 | Discharge: 2018-04-06 | Disposition: A | Discharge status: Home / Self Care | Payer: BLUE CROSS/BLUE SHIELD | Source: Ambulatory Visit | Attending: Hematology and Oncology | Admitting: Hematology and Oncology

## 2018-04-06 DIAGNOSIS — Z171 Estrogen receptor negative status [ER-]: Secondary | ICD-10-CM | POA: Insufficient documentation

## 2018-04-06 DIAGNOSIS — M549 Dorsalgia, unspecified: Secondary | ICD-10-CM | POA: Insufficient documentation

## 2018-04-06 DIAGNOSIS — C50412 Malignant neoplasm of upper-outer quadrant of left female breast: Secondary | ICD-10-CM | POA: Diagnosis not present

## 2018-04-06 DIAGNOSIS — R109 Unspecified abdominal pain: Secondary | ICD-10-CM | POA: Diagnosis not present

## 2018-04-06 MED ORDER — IOHEXOL 300 MG/ML  SOLN
100.0000 mL | Freq: Once | INTRAMUSCULAR | Status: AC | PRN
  Administered 2018-04-06: 100 mL via INTRAVENOUS

## 2018-04-07 ENCOUNTER — Encounter: Payer: Self-pay | Admitting: Adult Health

## 2018-04-07 ENCOUNTER — Other Ambulatory Visit: Payer: Self-pay

## 2018-04-07 ENCOUNTER — Inpatient Hospital Stay: Payer: BLUE CROSS/BLUE SHIELD

## 2018-04-07 ENCOUNTER — Inpatient Hospital Stay (HOSPITAL_BASED_OUTPATIENT_CLINIC_OR_DEPARTMENT_OTHER): Payer: BLUE CROSS/BLUE SHIELD | Admitting: Adult Health

## 2018-04-07 DIAGNOSIS — F329 Major depressive disorder, single episode, unspecified: Secondary | ICD-10-CM | POA: Diagnosis not present

## 2018-04-07 DIAGNOSIS — C50412 Malignant neoplasm of upper-outer quadrant of left female breast: Secondary | ICD-10-CM | POA: Diagnosis not present

## 2018-04-07 DIAGNOSIS — N898 Other specified noninflammatory disorders of vagina: Secondary | ICD-10-CM | POA: Diagnosis not present

## 2018-04-07 DIAGNOSIS — N390 Urinary tract infection, site not specified: Secondary | ICD-10-CM

## 2018-04-07 DIAGNOSIS — Z171 Estrogen receptor negative status [ER-]: Principal | ICD-10-CM

## 2018-04-07 DIAGNOSIS — Z79899 Other long term (current) drug therapy: Secondary | ICD-10-CM

## 2018-04-07 DIAGNOSIS — F419 Anxiety disorder, unspecified: Secondary | ICD-10-CM

## 2018-04-07 LAB — URINALYSIS, COMPLETE (UACMP) WITH MICROSCOPIC
Bacteria, UA: NONE SEEN
Bilirubin Urine: NEGATIVE
Glucose, UA: NEGATIVE mg/dL
Hgb urine dipstick: NEGATIVE
Ketones, ur: NEGATIVE mg/dL
Nitrite: NEGATIVE
Protein, ur: NEGATIVE mg/dL
Specific Gravity, Urine: 1.019 (ref 1.005–1.030)
pH: 6 (ref 5.0–8.0)

## 2018-04-07 LAB — WET PREP, GENITAL
Clue Cells Wet Prep HPF POC: NONE SEEN
Sperm: NONE SEEN
Trich, Wet Prep: NONE SEEN
Yeast Wet Prep HPF POC: NONE SEEN

## 2018-04-07 MED ORDER — CIPROFLOXACIN HCL 250 MG PO TABS: 250.0000 mg | ORAL_TABLET | Freq: Two times a day (BID) | ORAL | 0 refills | Status: DC

## 2018-04-07 MED FILL — CIPROFLOXACIN HCL 250 MG TA: 250 | 5 days supply | Qty: 10 | Fill #0

## 2018-04-07 NOTE — Progress Notes (Signed)
[place] [facility] Follow up:    Patient, [name] No address on file   DIAGNOSIS: Cancer Staging Malignant neoplasm of upper-outer quadrant of left breast in female, estrogen receptor negative ([place]) Staging form: Breast, AJCC 8th Edition - Clinical: Stage IIB (cT2, cN0, cM0, G3, ER-, PR-, HER2-) - Signed by Nicholas Lose, MD on 03/18/2018   SUMMARY OF ONCOLOGIC HISTORY:   Malignant neoplasm of upper-outer quadrant of left breast in female, estrogen receptor negative ([place])   03/12/2018 Initial Diagnosis    Palpable masses in both breasts with family history of breast cancer, breast density category D, ultrasound measured these hypoechoic irregular mass left breast 2.3 cm, right breast no abnormality, biopsy left breast mass: IDC grade 3 ER 0%, PR 0%, HER-2 negative, Ki-67 90%, lymph node biopsy benign: T2N0    03/18/2018 Cancer Staging    Staging form: Breast, AJCC 8th Edition - Clinical: Stage IIB (cT2, cN0, cM0, G3, ER-, PR-, HER2-) - Signed by Nicholas Lose, MD on 03/18/2018     CURRENT THERAPY: cycle 1 day 5 of AC  INTERVAL HISTORY: Mailen Newborn 32 y.o. female returns for evaluation of dysuria and vulvar pain.  She says she feels like her bilateral vulva, labia, and vagina are swollen and tender.  She notes an burning with urination.  She denies any STI infection or history of herpes, or possibility of exposure.  She is having a vaginal discharge over the past few days that is thin and white.    Patient Active Problem List   Diagnosis Date Noted  . Malignant neoplasm of upper-outer quadrant of left breast in female, estrogen receptor negative ([place]) 03/18/2018    has No Known Allergies.  MEDICAL HISTORY: Past Medical History:  Diagnosis Date  . Anxiety   . Depression   . Headache    migraines  . History of kidney stones     SURGICAL HISTORY: Past Surgical History:  Procedure Laterality Date  . CESAREAN SECTION    . OTHER SURGICAL HISTORY    .  PORTACATH PLACEMENT Left 04/01/2018   Procedure: INSERTION PORT-A-CATH;  Surgeon: [name], MD;  Location: [facility];  Service: General;  Laterality: Left;    SOCIAL HISTORY: Social History   Socioeconomic History  . Marital status: Married    Spouse name: Not on file  . Number of children: Not on file  . Years of education: Not on file  . Highest education level: Not on file  Occupational History  . Not on file  Social Needs  . Financial resource strain: Not on file  . Food insecurity:    Worry: Not on file    Inability: Not on file  . Transportation needs:    Medical: Not on file    Non-medical: Not on file  Tobacco Use  . Smoking status: Never Smoker  . Smokeless tobacco: Never Used  Substance and Sexual Activity  . Alcohol use: Yes    Comment: Rare  . Drug use: Never  . Sexual activity: Yes    Birth control/protection: None    Comment: 1st intercourse 32 yo-Fewer than 5 partners  Lifestyle  . Physical activity:    Days per week: Not on file    Minutes per session: Not on file  . Stress: Not on file  Relationships  . Social connections:    Talks on phone: Not on file    Gets together: Not on file    Attends religious service: Not on file  Active member of club or organization: Not on file    Attends meetings of clubs or organizations: Not on file    Relationship status: Not on file  . Intimate partner violence:    Fear of current or ex partner: Not on file    Emotionally abused: Not on file    Physically abused: Not on file    Forced sexual activity: Not on file  Other Topics Concern  . Not on file  Social History Narrative  . Not on file    FAMILY HISTORY: Family History  Problem Relation Age of Onset  . Heart disease Father   . Breast cancer Maternal Aunt        30's  . Breast cancer Cousin        diagnosed in her 64's    Review of Systems  Constitutional: Negative for appetite change, chills, fatigue, fever and unexpected  weight change.  Respiratory: Negative for chest tightness, cough and shortness of breath.   Cardiovascular: Negative for chest pain, leg swelling and palpitations.  Gastrointestinal: Negative for abdominal pain, constipation, diarrhea, nausea and vomiting.  Genitourinary: Positive for difficulty urinating, dysuria, frequency and vaginal discharge. Negative for bladder incontinence, dyspareunia, hematuria, menstrual problem, pelvic pain and vaginal bleeding.   Hematological: Negative for adenopathy.  Psychiatric/Behavioral: Negative for depression. The patient is not nervous/anxious.       PHYSICAL EXAMINATION  ECOG PERFORMANCE STATUS: 1 - Symptomatic but completely ambulatory  There were no vitals filed for this visit.  Physical Exam  Constitutional: She appears well-developed and well-nourished.  HENT:  Head: Normocephalic and atraumatic.  Mouth/Throat: Oropharynx is clear and moist. No oropharyngeal exudate.  Eyes: Pupils are equal, round, and reactive to light. No scleral icterus.  Neck: Neck supple.  Cardiovascular: Normal rate, regular rhythm and normal heart sounds.  Pulmonary/Chest: Effort normal and breath sounds normal.  Abdominal: Soft. Bowel sounds are normal.  Genitourinary: Vaginal discharge (thin white discharge noted, wet prep collected) found.  Genitourinary Comments: No labial, vulvar, or vaginal lesions noted.    Skin: Skin is warm and dry. No rash noted.  Psychiatric: She has a normal mood and affect.    LABORATORY DATA:  CBC    Component Value Date/Time   WBC 3.6 (L) 03/30/2018 0913   WBC 5.3 03/03/2018 1114   RBC 4.10 03/30/2018 0913   HGB 11.7 03/30/2018 0913   HCT 35.2 03/30/2018 0913   PLT 260 03/30/2018 0913   MCV 85.7 03/30/2018 0913   MCH 28.5 03/30/2018 0913   MCHC 33.2 03/30/2018 0913   RDW 14.2 03/30/2018 0913   LYMPHSABS 0.9 03/30/2018 0913   MONOABS 0.4 03/30/2018 0913   EOSABS 0.1 03/30/2018 0913   BASOSABS 0.0 03/30/2018 0913     CMP     Component Value Date/Time   NA 139 03/30/2018 0913   K 4.4 03/30/2018 0913   CL 105 03/30/2018 0913   CO2 28 03/30/2018 0913   GLUCOSE 94 03/30/2018 0913   BUN 9 03/30/2018 0913   CREATININE 0.66 03/30/2018 0913   CREATININE 0.63 03/03/2018 1114   CALCIUM 9.1 03/30/2018 0913   PROT 6.7 03/30/2018 0913   ALBUMIN 3.9 03/30/2018 0913   AST 14 (L) 03/30/2018 0913   ALT 14 03/30/2018 0913   ALKPHOS 59 03/30/2018 0913   BILITOT 0.4 03/30/2018 0913   GFRNONAA >60 03/30/2018 0913   GFRAA >60 03/30/2018 0913        ASSESSMENT and THERAPY PLAN:   Malignant neoplasm of upper-outer  quadrant of left breast in female, estrogen receptor negative ([place]) 03/12/2018:Palpable masses in both breasts with family history of breast cancer, breast density category D, ultrasound measured these hypoechoic irregular mass left breast 2.3 cm, right breast no abnormality, biopsy left breast mass: IDC grade 3 ER 0%, PR 0%, HER-2 negative, Ki-67 90%, lymph node biopsy benign: T2N0, stage IIb  Recommendation based on multidisciplinary tumor board: Genetics consultation 1. Neoadjuvant chemotherapy with Adriamycin and Cytoxan dose dense 4 followed by Taxol with carboplatin weekly 12 2. Followed by breast conserving surgery with sentinel lymph node study vs targeted axillary dissection 3. Followed by adjuvant radiation therapy ---------------------------------------------------------------------------------------------------------------- Current Treatment: Cycle 1 day 5 Dose dense Adriamycin and Cytoxan  Wet prep and urinalysis sent.  No genital lesions noted. At time of patient appointment completion there were no results back.  However about 2 hours later, results noted + UTI and negative wet prep.  Cipro 253m po bid x 5 days sent into her pharmacy.       Orders Placed This Encounter  Procedures  . Wet prep, genital    Order Specific Question:   Source    Answer:   vaginal exam    All  questions were answered. The patient knows to call the clinic with any problems, questions or concerns. We can certainly see the patient much sooner if necessary.  A total of (20) minutes of face-to-face time was spent with this patient with greater than 50% of that time in counseling and care-coordination.  This note was electronically signed. LScot Dock NP 04/07/2018

## 2018-04-07 NOTE — Progress Notes (Signed)
Pt interpreter, Almyra Free, came to seek RN advice regarding pt having some burning and discomfort during urination. Pt also having some vaginal swelling. Pt is almost 1 week post A/C. Paschal Dopp to let pt know that we will need to check her urine for UTI, and get her on antibiotic treatment, with Dr.Gudena approval. Pt will be coming in today for a lab appt.  Encouraged pt to increase fluid hydration and to use non drying/ hypoallergenic soap when performing feminine hygiene. Also recommend pt to stop using any feminine douche wash, as it may disrupt pt natural ph, and be more prone to yeast infection. Pt will be coming on Thursday for nadir appt.

## 2018-04-07 NOTE — Assessment & Plan Note (Signed)
03/12/2018:Palpable masses in both breasts with family history of breast cancer, breast density category D, ultrasound measured these hypoechoic irregular mass left breast 2.3 cm, right breast no abnormality, biopsy left breast mass: IDC grade 3 ER 0%, PR 0%, HER-2 negative, Ki-67 90%, lymph node biopsy benign: T2N0, stage IIb  Recommendation based on multidisciplinary tumor board: Genetics consultation 1. Neoadjuvant chemotherapy with Adriamycin and Cytoxan dose dense 4 followed by Taxol with carboplatin weekly 12 2. Followed by breast conserving surgery with sentinel lymph node study vs targeted axillary dissection 3. Followed by adjuvant radiation therapy ---------------------------------------------------------------------------------------------------------------- Current Treatment: Cycle 1 day 5 Dose dense Adriamycin and Cytoxan  Wet prep and urinalysis sent.  No genital lesions noted. At time of patient appointment completion there were no results back.  However about 2 hours later, results noted + UTI and negative wet prep.  Cipro 287m po bid x 5 days sent into her pharmacy.

## 2018-04-07 NOTE — Progress Notes (Signed)
Contacted pt interpreter, Almyra Free, to let pt know that her results from wet prep was ok, however, her urine culture did show some presence of bacteria. She will need to be treated with prophylactic antibiotic (cipro 250BID po x 5 days), per Lindsey,NP. Sent an escript to [employer]. Advised that pt take medication with food and plenty of fluids.

## 2018-04-08 ENCOUNTER — Telehealth: Payer: Self-pay | Admitting: Adult Health

## 2018-04-08 LAB — URINE CULTURE: Culture: NO GROWTH

## 2018-04-08 NOTE — Telephone Encounter (Signed)
No 10/8 los orders or referrals.

## 2018-04-09 ENCOUNTER — Encounter: Payer: Self-pay | Admitting: *Deleted

## 2018-04-09 ENCOUNTER — Inpatient Hospital Stay: Payer: BLUE CROSS/BLUE SHIELD

## 2018-04-09 ENCOUNTER — Inpatient Hospital Stay (HOSPITAL_BASED_OUTPATIENT_CLINIC_OR_DEPARTMENT_OTHER): Payer: BLUE CROSS/BLUE SHIELD | Admitting: Hematology and Oncology

## 2018-04-09 DIAGNOSIS — C50412 Malignant neoplasm of upper-outer quadrant of left female breast: Secondary | ICD-10-CM

## 2018-04-09 DIAGNOSIS — Z171 Estrogen receptor negative status [ER-]: Secondary | ICD-10-CM

## 2018-04-09 DIAGNOSIS — R509 Fever, unspecified: Secondary | ICD-10-CM

## 2018-04-09 DIAGNOSIS — R1110 Vomiting, unspecified: Secondary | ICD-10-CM

## 2018-04-09 DIAGNOSIS — Z95828 Presence of other vascular implants and grafts: Secondary | ICD-10-CM | POA: Insufficient documentation

## 2018-04-09 LAB — CBC WITH DIFFERENTIAL (CANCER CENTER ONLY)
Abs Immature Granulocytes: 0 10*3/uL (ref 0.00–0.07)
Basophils Absolute: 0 10*3/uL (ref 0.0–0.1)
Basophils Relative: 2 %
Eosinophils Absolute: 0.1 10*3/uL (ref 0.0–0.5)
Eosinophils Relative: 10 %
HCT: 33.2 % — ABNORMAL LOW (ref 36.0–46.0)
Hemoglobin: 11 g/dL — ABNORMAL LOW (ref 12.0–15.0)
Immature Granulocytes: 0 %
Lymphocytes Relative: 73 %
Lymphs Abs: 0.7 10*3/uL (ref 0.7–4.0)
MCH: 27.8 pg (ref 26.0–34.0)
MCHC: 33.1 g/dL (ref 30.0–36.0)
MCV: 83.8 fL (ref 80.0–100.0)
Monocytes Absolute: 0.1 10*3/uL (ref 0.1–1.0)
Monocytes Relative: 8 %
Neutro Abs: 0.1 10*3/uL — CL (ref 1.7–7.7)
Neutrophils Relative %: 7 %
Platelet Count: 177 10*3/uL (ref 150–400)
RBC: 3.96 MIL/uL (ref 3.87–5.11)
RDW: 11.9 % (ref 11.5–15.5)
WBC Count: 0.9 10*3/uL — CL (ref 4.0–10.5)
nRBC: 0 % (ref 0.0–0.2)

## 2018-04-09 LAB — CMP (CANCER CENTER ONLY)
ALT: 22 U/L (ref 0–44)
AST: 13 U/L — ABNORMAL LOW (ref 15–41)
Albumin: 3.9 g/dL (ref 3.5–5.0)
Alkaline Phosphatase: 74 U/L (ref 38–126)
Anion gap: 9 (ref 5–15)
BUN: 11 mg/dL (ref 6–20)
CO2: 25 mmol/L (ref 22–32)
Calcium: 9.4 mg/dL (ref 8.9–10.3)
Chloride: 105 mmol/L (ref 98–111)
Creatinine: 0.6 mg/dL (ref 0.44–1.00)
GFR, Est AFR Am: >60 mL/min (ref >60)
GFR, Estimated: >60 mL/min (ref >60)
Glucose, Bld: 108 mg/dL — ABNORMAL HIGH (ref 70–99)
Potassium: 3.8 mmol/L (ref 3.5–5.1)
Sodium: 139 mmol/L (ref 135–145)
Total Bilirubin: 0.5 mg/dL (ref 0.3–1.2)
Total Protein: 7 g/dL (ref 6.5–8.1)

## 2018-04-09 MED ORDER — SODIUM CHLORIDE 0.9% FLUSH
10.0000 mL | Freq: Once | INTRAVENOUS | Status: AC
  Administered 2018-04-09: 10 mL
  Filled 2018-04-09: qty 10

## 2018-04-09 MED ORDER — HEPARIN SOD (PORK) LOCK FLUSH 100 UNIT/ML IV SOLN
500.0000 [IU] | Freq: Once | INTRAVENOUS | Status: AC
  Administered 2018-04-09: 500 [IU]
  Filled 2018-04-09: qty 5

## 2018-04-09 MED ORDER — LEVOFLOXACIN 750 MG PO TABS: 750.0000 mg | ORAL_TABLET | Freq: Every day | ORAL | 0 refills | Status: DC

## 2018-04-09 MED FILL — levoFLOXacin 750 MG TABS: 750 | 7 days supply | Qty: 7 | Fill #0

## 2018-04-09 NOTE — Assessment & Plan Note (Signed)
03/12/2018:Palpable masses in both breasts with family history of breast cancer, breast density category D, ultrasound measured these hypoechoic irregular mass left breast 2.3 cm, right breast no abnormality, biopsy left breast mass: IDC grade 3 ER 0%, PR 0%, HER-2 negative, Ki-67 90%, lymph node biopsy benign: T2N0, stage IIb  Recommendationbased on multidisciplinary tumor board: Genetics consultation 1. Neoadjuvant chemotherapy with Adriamycin and Cytoxan dose dense 4 followed byTaxol with carboplatinweekly 12 2. Followed by breast conserving surgery with sentinel lymph node study vs targeted axillary dissection 3. Followed by adjuvant radiation therapy ---------------------------------------------------------------------------------------------------------------- Current Treatment: Neoadjuvant chemotherapy with dose dense Adriamycin and Cytoxan.  Today is cycle 1 day 8  Chemo toxicities: 1.  Nausea and vomiting 2. neutropenic fever: She has low-grade temperature of 100 degrees.  I would like to send a prescription for Levaquin 750 daily for 7 days.  I encouraged her to call us if her temperature goes above 100.5.  I counseled her about hair loss with chemotherapy. Return to clinic in 1 week for cycle 2.

## 2018-04-09 NOTE — Progress Notes (Signed)
Patient Care Team: Patient, [name] as PCP - General (General Practice)  DIAGNOSIS:  Encounter Diagnosis  Name Primary?  . Malignant neoplasm of upper-outer quadrant of left breast in female, estrogen receptor negative ([place])     SUMMARY OF ONCOLOGIC HISTORY:   Malignant neoplasm of upper-outer quadrant of left breast in female, estrogen receptor negative ([place])   03/12/2018 Initial Diagnosis    Palpable masses in both breasts with family history of breast cancer, breast density category D, ultrasound measured these hypoechoic irregular mass left breast 2.3 cm, right breast no abnormality, biopsy left breast mass: IDC grade 3 ER 0%, PR 0%, HER-2 negative, Ki-67 90%, lymph node biopsy benign: T2N0    03/18/2018 Cancer Staging    Staging form: Breast, AJCC 8th Edition - Clinical: Stage IIB (cT2, cN0, cM0, G3, ER-, PR-, HER2-) - Signed by Nicholas Lose, MD on 03/18/2018     CHIEF COMPLIANT: Cycle 1 day 8 dose dense Adriamycin and Cytoxan  INTERVAL HISTORY: Samantha Mcneil is a 32 year old with above-mentioned history of triple negative left breast cancer who is here for toxicity evaluation after receiving first cycle of chemotherapy with dose dense Adriamycin and Cytoxan last week.  She came in a couple of days ago with complaint of vaginal symptoms and urine analysis was performed along with wet prep.  There was no evidence of infection identified.  She was put on ciprofloxacin.  She had a low-grade temperature of 100degrees yesterday.  She took Tylenol and the fever went away.  She does feel weak and shaky.  She had one episode of emesis.  Denies any mouth sores.  Bowels are moving appropriately.  REVIEW OF SYSTEMS:   Constitutional: Denies fevers, chills or abnormal weight loss Eyes: Denies blurriness of vision Ears, nose, mouth, throat, and face: Denies mucositis or sore throat Respiratory: Denies cough, dyspnea or wheezes Cardiovascular: Denies palpitation, chest  discomfort Gastrointestinal:  Denies nausea, heartburn or change in bowel habits Skin: Denies abnormal skin rashes Lymphatics: Denies new lymphadenopathy or easy bruising Neurological:Denies numbness, tingling or new weaknesses Behavioral/Psych: Mood is stable, no new changes  Extremities: No lower extremity edema  All other systems were reviewed with the patient and are negative.  I have reviewed the past medical history, past surgical history, social history and family history with the patient and they are unchanged from previous note.  ALLERGIES:  has No Known Allergies.  MEDICATIONS:  Current Outpatient Medications  Medication Sig Dispense Refill  . ibuprofen (ADVIL,MOTRIN) 200 MG tablet Take 200 mg by mouth every 6 (six) hours as needed.    Marland Kitchen levofloxacin (LEVAQUIN) 750 MG tablet Take 1 tablet (750 mg total) by mouth daily. 7 tablet 0  . lidocaine-prilocaine (EMLA) cream Apply to affected area once 30 g 3  . LORazepam (ATIVAN) 0.5 MG tablet Take 1 tablet (0.5 mg total) by mouth at bedtime as needed for sleep. 30 tablet 0  . ondansetron (ZOFRAN) 8 MG tablet Take 1 tablet (8 mg total) by mouth 2 (two) times daily as needed. Start on the third day after chemotherapy. 30 tablet 1  . oxyCODONE (OXY IR/ROXICODONE) 5 MG immediate release tablet Take 1 tablet (5 mg total) by mouth every 6 (six) hours as needed for severe pain. 10 tablet 0  . prochlorperazine (COMPAZINE) 10 MG tablet Take 1 tablet (10 mg total) by mouth every 6 (six) hours as needed (Nausea or vomiting). 30 tablet 1  . venlafaxine XR (EFFEXOR-XR) 37.5 MG 24 hr capsule Take 1 capsule (  37.5 mg total) by mouth daily with breakfast. 30 capsule 6   No current facility-administered medications for this visit.     PHYSICAL EXAMINATION: ECOG PERFORMANCE STATUS: 1 - Symptomatic but completely ambulatory  Vitals:   04/09/18 1131  BP: 116/64  Pulse: 78  Resp: 18  Temp: 98.8 F (37.1 C)  SpO2: 100%   Filed Weights    04/09/18 1131  Weight: 135 lb 11.2 oz (61.6 kg)    GENERAL:alert, no distress and comfortable SKIN: skin color, texture, turgor are normal, no rashes or significant lesions EYES: normal, Conjunctiva are pink and non-injected, sclera clear OROPHARYNX:no exudate, no erythema and lips, buccal mucosa, and tongue normal  NECK: supple, thyroid normal size, non-tender, without nodularity LYMPH:  no palpable lymphadenopathy in the cervical, axillary or inguinal LUNGS: clear to auscultation and percussion with normal breathing effort HEART: regular rate & rhythm and no murmurs and no lower extremity edema ABDOMEN:abdomen soft, non-tender and normal bowel sounds MUSCULOSKELETAL:no cyanosis of digits and no clubbing  NEURO: alert & oriented x 3 with fluent speech, no focal motor/sensory deficits EXTREMITIES: No lower extremity edema    LABORATORY DATA:  I have reviewed the data as listed CMP Latest Ref Rng & Units 04/09/2018 03/30/2018 03/03/2018  Glucose 70 - 99 mg/dL 108(H) 94 95  BUN 6 - 20 mg/dL _0 Creatinine 0.44 - 1.00 mg/dL 0.60 0.66 0.63  Sodium 135 - 145 mmol/L 139 139 138  Potassium 3.5 - 5.1 mmol/L 3.8 4.4 5.0  Chloride 98 - 111 mmol/L 105 105 106  CO2 22 - 32 mmol/L _1 Calcium 8.9 - 10.3 mg/dL 9.4 9.1 9.4  Total Protein 6.5 - 8.1 g/dL 7.0 6.7 6.6  Total Bilirubin 0.3 - 1.2 mg/dL 0.5 0.4 0.4  Alkaline Phos 38 - 126 U/L 74 59 -  AST 15 - 41 U/L 13(L) 14(L) 12  ALT 0 - 44 U/L _2 Lab Results  Component Value Date   WBC 0.9 (LL) 04/09/2018   HGB 11.0 (L) 04/09/2018   HCT 33.2 (L) 04/09/2018   MCV 83.8 04/09/2018   PLT 177 04/09/2018   NEUTROABS 0.1 (LL) 04/09/2018    ASSESSMENT & PLAN:  Malignant neoplasm of upper-outer quadrant of left breast in female, estrogen receptor negative ([place]) 03/12/2018:Palpable masses in both breasts with family history of breast cancer, breast density category D, ultrasound measured these hypoechoic irregular mass left breast  2.3 cm, right breast no abnormality, biopsy left breast mass: IDC grade 3 ER 0%, PR 0%, HER-2 negative, Ki-67 90%, lymph node biopsy benign: T2N0, stage IIb  Recommendationbased on multidisciplinary tumor board: Genetics consultation 1. Neoadjuvant chemotherapy with Adriamycin and Cytoxan dose dense 4 followed byTaxol with carboplatinweekly 12 2. Followed by breast conserving surgery with sentinel lymph node study vs targeted axillary dissection 3. Followed by adjuvant radiation therapy ---------------------------------------------------------------------------------------------------------------- Current Treatment: Neoadjuvant chemotherapy with dose dense Adriamycin and Cytoxan.  Today is cycle 1 day 8  Chemo toxicities: 1.  Nausea and vomiting 2. neutropenic fever: She has low-grade temperature of 100 degrees.  I would like to send a prescription for Levaquin 750 daily for 7 days.  I encouraged her to call us if her temperature goes above 100.5.  I counseled her about hair loss with chemotherapy. Return to clinic in 1 week for cycle 2.  No orders of the defined types were placed in this encounter.  The patient has a good understanding of the overall plan. she  agrees with it. she will call with any problems that may develop before the next visit here.   Harriette Ohara, MD 04/09/18

## 2018-04-13 ENCOUNTER — Encounter: Payer: Self-pay | Admitting: Licensed Clinical Social Worker

## 2018-04-13 ENCOUNTER — Inpatient Hospital Stay (HOSPITAL_BASED_OUTPATIENT_CLINIC_OR_DEPARTMENT_OTHER): Payer: BLUE CROSS/BLUE SHIELD | Admitting: Licensed Clinical Social Worker

## 2018-04-13 ENCOUNTER — Inpatient Hospital Stay: Payer: BLUE CROSS/BLUE SHIELD

## 2018-04-13 DIAGNOSIS — Z800 Family history of malignant neoplasm of digestive organs: Secondary | ICD-10-CM | POA: Insufficient documentation

## 2018-04-13 DIAGNOSIS — C50412 Malignant neoplasm of upper-outer quadrant of left female breast: Secondary | ICD-10-CM

## 2018-04-13 DIAGNOSIS — Z801 Family history of malignant neoplasm of trachea, bronchus and lung: Secondary | ICD-10-CM | POA: Insufficient documentation

## 2018-04-13 DIAGNOSIS — Z171 Estrogen receptor negative status [ER-]: Secondary | ICD-10-CM

## 2018-04-13 DIAGNOSIS — Z803 Family history of malignant neoplasm of breast: Secondary | ICD-10-CM | POA: Insufficient documentation

## 2018-04-13 NOTE — Progress Notes (Signed)
REFERRING PROVIDER: [name], MD [address] [place], [place] [address]  PRIMARY REASON FOR VISIT:  1. Malignant neoplasm of upper-outer quadrant of left breast in female, estrogen receptor negative ([place])   2. Family history of breast cancer   3. Family history of lung cancer   4. Family history of liver cancer      HISTORY OF PRESENT ILLNESS:   Samantha Mcneil, a 32 y.o. female, was seen for a [facility] cancer genetics consultation at the request of Dr. Barry Dienes due to a personal and family history of breast cancer.  Samantha Mcneil presents to clinic today to discuss the possibility of a hereditary predisposition to cancer, genetic testing, and to further clarify her future cancer risks, as well as potential cancer risks for family members.   In 2019, at the age of 47, Samantha Mcneil was diagnosed with cancer of the left breast, triple negative. The current treatment plan is neoadjuvant chemotherapy followed by surgery and radiation.   CANCER HISTORY:    Malignant neoplasm of upper-outer quadrant of left breast in female, estrogen receptor negative ([place])   03/12/2018 Initial Diagnosis    Palpable masses in both breasts with family history of breast cancer, breast density category D, ultrasound measured these hypoechoic irregular mass left breast 2.3 cm, right breast no abnormality, biopsy left breast mass: IDC grade 3 ER 0%, PR 0%, HER-2 negative, Ki-67 90%, lymph node biopsy benign: T2N0    03/18/2018 Cancer Staging    Staging form: Breast, AJCC 8th Edition - Clinical: Stage IIB (cT2, cN0, cM0, G3, ER-, PR-, HER2-) - Signed by Nicholas Lose, MD on 03/18/2018      HORMONAL RISK FACTORS:  Menarche was at age 89.  First live birth at age 67.  OCP use for approximately 10 years.  Ovaries intact: yes.  Hysterectomy: no.  Menopausal status: premenopausal.  HRT use: 0 years. Colonoscopy: no; not examined. Mammogram within the last year: yes. Number of breast biopsies:  1.  Past Medical History:  Diagnosis Date  . Anxiety   . Depression   . Family history of breast cancer   . Family history of liver cancer   . Family history of lung cancer   . Headache    migraines  . History of kidney stones     Past Surgical History:  Procedure Laterality Date  . CESAREAN SECTION    . OTHER SURGICAL HISTORY    . PORTACATH PLACEMENT Left 04/01/2018   Procedure: INSERTION PORT-A-CATH;  Surgeon: [name], MD;  Location: [facility];  Service: General;  Laterality: Left;    Social History   Socioeconomic History  . Marital status: Married    Spouse name: Not on file  . Number of children: Not on file  . Years of education: Not on file  . Highest education level: Not on file  Occupational History  . Not on file  Social Needs  . Financial resource strain: Not on file  . Food insecurity:    Worry: Not on file    Inability: Not on file  . Transportation needs:    Medical: Not on file    Non-medical: Not on file  Tobacco Use  . Smoking status: Never Smoker  . Smokeless tobacco: Never Used  Substance and Sexual Activity  . Alcohol use: Yes    Comment: Rare  . Drug use: Never  . Sexual activity: Yes    Birth control/protection: None    Comment: 1st intercourse 32  yo-Fewer than 5 partners  Lifestyle  . Physical activity:    Days per week: Not on file    Minutes per session: Not on file  . Stress: Not on file  Relationships  . Social connections:    Talks on phone: Not on file    Gets together: Not on file    Attends religious service: Not on file    Active member of club or organization: Not on file    Attends meetings of clubs or organizations: Not on file    Relationship status: Not on file  Other Topics Concern  . Not on file  Social History Narrative  . Not on file     FAMILY HISTORY:  We obtained a detailed, 4-generation family history.  Significant diagnoses are listed below: Family History  Problem Relation Age  of Onset  . Heart disease Father        d. 40  . Breast cancer Maternal Aunt        d. 9  . Breast cancer Cousin        dx 61  . Liver cancer Maternal Grandmother        d. 45  . Lung cancer Maternal Grandfather        d. 59  . Breast cancer Cousin        dx 37s   Samantha Mcneil has a son, 53, and a daughter, 43. She has one brother, 8, who has a son.   Samantha Mcneil mother is 47 and was present at the session. She has 6 brothers and 5 sisters. One of her sisters had breast cancer and died at 10. One of her brother's died in an accident. The rest of her brothers and sisters are living and are cancer free. Samantha Mcneil has a maternal cousin who had breast cancer diagnosed at 22 and is 33. She had genetic testing that was negative. Samantha Mcneil maternal grandfather died of lung cancer at 67 and her maternal grandmother died of liver cancer at 40. Samantha Mcneil grandfather's sister had a child that was recently diagnosed with breast cancer at 3. Another one of Samantha Mcneil's grandfather's sisters had a granddaughter who was diagnosed with breast cancer in her 36's.   Samantha Mcneil father died at 38 due to heart issues. He had two sisters. No cancers on this side of the family that Samantha Mcneil is aware of. She does not have information about her grandfather but believes her grandmother died of Alzheimer's.   Samantha Mcneil is aware of previous family history of genetic testing for hereditary cancer risks but does not have the report or details regarding this testing. Patient's maternal and paternal side of the family are from [place] There is no reported Ashkenazi Jewish ancestry. There is no known consanguinity.  GENETIC COUNSELING ASSESSMENT: Samantha Mcneil is a 32 y.o. female with a personal and family history of breast cancer which is somewhat suggestive of a Hereditary Cancer Predisposition Syndrome. We, therefore, discussed and recommended the following at today's visit.    DISCUSSION: We discussed that about 5-10% of breast cancer cases are hereditary with most cases due to BRCA mutations.  Other genes associated with hereditary breast cancer cases include ATM, CHEK2 and PALB2.  We reviewed the characteristics, features and inheritance patterns of hereditary cancer syndromes. We also discussed genetic testing, including the appropriate family members to test, the process of testing, insurance coverage and turn-around-time for results. We discussed the implications of a negative, positive and/or  variant of uncertain significant result. We recommended Samantha Mcneil pursue genetic testing for the Common Hereditary Cancers Panel.  The Common Hereditary Cancers Panel offered by [facility] includes sequencing and/or deletion duplication testing of the following 47 genes: APC, ATM, AXIN2, BARD1, BMPR1A, BRCA1, BRCA2, BRIP1, CDH1, CDKN2A (p14ARF), CDKN2A (p16INK4a), CKD4, CHEK2, CTNNA1, DICER1, EPCAM (Deletion/duplication testing only), GREM1 (promoter region deletion/duplication testing only), KIT, MEN1, MLH1, MSH2, MSH3, MSH6, MUTYH, NBN, NF1, NHTL1, PALB2, PDGFRA, PMS2, POLD1, POLE, PTEN, RAD50, RAD51C, RAD51D, SDHB, SDHC, SDHD, SMAD4, SMARCA4. STK11, TP53, TSC1, TSC2, and VHL.  The following genes were evaluated for sequence changes only: SDHA and HOXB13 c.251G>A variant only.  We discussed that if she is found to have a mutation in one of these genes, it may impact future medical management recommendations such as increased cancer screenings and consideration of risk reducing surgeries.  A positive result could also have implications for the patient's family members.  A Negative result would mean we were unable to identify a hereditary component to her personal and family history of cancer but does not rule out the possibility of a hereditary basis for her personal and family history of cancer.  There could be mutations that are undetectable by current technology, or in genes not  yet tested or identified to increase cancer risk.    We discussed the potential to find a Variant of Uncertain Significance or VUS.  These are variants that have not yet been identified as pathogenic or benign, and it is unknown if this variant is associated with increased cancer risk or if this is a normal finding.  Most VUS's are reclassified to benign or likely benign.   It should not be used to make medical management decisions. With time, we suspect the lab will determine the significance of any VUS's identified if any.   Based on Samantha Mcneil's personal and family history of breast cancer, she meets NCCN medical criteria for genetic testing. Despite that she meets criteria, she may still have an out of pocket cost. The lab will provide her with an OOP cost if any.  PLAN: After considering the risks, benefits, and limitations, Samantha Mcneil  provided informed consent to pursue genetic testing and the blood sample was sent to [facility] for analysis of the Common Hereditary Cancers Panel. Results should be available within approximately 2-3 weeks' time, at which point they will be disclosed by telephone to Samantha Mcneil, as will any additional recommendations warranted by these results. Samantha Mcneil will receive a summary of her genetic counseling visit and a copy of her results once available. This information will also be available in Epic.  Lastly, we encouraged Samantha Mcneil. [place] to remain in contact with cancer genetics annually so that we can continuously update the family history and inform her of any changes in cancer genetics and testing that may be of benefit for this family.   Samantha Mcneil.  [place] questions were answered to her satisfaction today. Our contact information was provided should additional questions or concerns arise. Thank you for the referral and allowing [place] to share in the care of your patient.   Faith Rogue, Samantha Mcneil Genetic Counselor [email or web address] Phone:  [phone]  The patient was seen for a total of 40 minutes in face-to-face genetic counseling.  The patient was accompanied today by her husband and mother. This patient was discussed with Drs. Magrinat, Lindi Adie and/or Burr Medico who agrees with the above.

## 2018-04-16 ENCOUNTER — Inpatient Hospital Stay: Payer: BLUE CROSS/BLUE SHIELD

## 2018-04-16 ENCOUNTER — Inpatient Hospital Stay (HOSPITAL_BASED_OUTPATIENT_CLINIC_OR_DEPARTMENT_OTHER): Payer: BLUE CROSS/BLUE SHIELD | Admitting: Hematology and Oncology

## 2018-04-16 DIAGNOSIS — Z171 Estrogen receptor negative status [ER-]: Secondary | ICD-10-CM

## 2018-04-16 DIAGNOSIS — R509 Fever, unspecified: Secondary | ICD-10-CM

## 2018-04-16 DIAGNOSIS — Z95828 Presence of other vascular implants and grafts: Secondary | ICD-10-CM

## 2018-04-16 DIAGNOSIS — C50412 Malignant neoplasm of upper-outer quadrant of left female breast: Secondary | ICD-10-CM | POA: Diagnosis not present

## 2018-04-16 DIAGNOSIS — D701 Agranulocytosis secondary to cancer chemotherapy: Secondary | ICD-10-CM

## 2018-04-16 DIAGNOSIS — R112 Nausea with vomiting, unspecified: Secondary | ICD-10-CM

## 2018-04-16 LAB — CBC WITH DIFFERENTIAL (CANCER CENTER ONLY)
Abs Immature Granulocytes: 0.59 10*3/uL — ABNORMAL HIGH (ref 0.00–0.07)
Basophils Absolute: 0.1 10*3/uL (ref 0.0–0.1)
Basophils Relative: 1 %
Eosinophils Absolute: 0 10*3/uL (ref 0.0–0.5)
Eosinophils Relative: 0 %
HCT: 32.2 % — ABNORMAL LOW (ref 36.0–46.0)
Hemoglobin: 10.7 g/dL — ABNORMAL LOW (ref 12.0–15.0)
Immature Granulocytes: 8 %
Lymphocytes Relative: 26 %
Lymphs Abs: 1.9 10*3/uL (ref 0.7–4.0)
MCH: 28.1 pg (ref 26.0–34.0)
MCHC: 33.2 g/dL (ref 30.0–36.0)
MCV: 84.5 fL (ref 80.0–100.0)
Monocytes Absolute: 0.8 10*3/uL (ref 0.1–1.0)
Monocytes Relative: 11 %
Neutro Abs: 3.9 10*3/uL (ref 1.7–7.7)
Neutrophils Relative %: 54 %
Platelet Count: 366 10*3/uL (ref 150–400)
RBC: 3.81 MIL/uL — ABNORMAL LOW (ref 3.87–5.11)
RDW: 12.1 % (ref 11.5–15.5)
WBC Count: 7.3 10*3/uL (ref 4.0–10.5)
nRBC: 0 % (ref 0.0–0.2)

## 2018-04-16 LAB — CMP (CANCER CENTER ONLY)
ALT: 19 U/L (ref 0–44)
AST: 16 U/L (ref 15–41)
Albumin: 3.9 g/dL (ref 3.5–5.0)
Alkaline Phosphatase: 78 U/L (ref 38–126)
Anion gap: 7 (ref 5–15)
BUN: 11 mg/dL (ref 6–20)
CO2: 27 mmol/L (ref 22–32)
Calcium: 9.4 mg/dL (ref 8.9–10.3)
Chloride: 107 mmol/L (ref 98–111)
Creatinine: 0.6 mg/dL (ref 0.44–1.00)
GFR, Est AFR Am: >60 mL/min (ref >60)
GFR, Estimated: >60 mL/min (ref >60)
Glucose, Bld: 91 mg/dL (ref 70–99)
Potassium: 4.3 mmol/L (ref 3.5–5.1)
Sodium: 141 mmol/L (ref 135–145)
Total Bilirubin: <0.2 mg/dL — ABNORMAL LOW (ref 0.3–1.2)
Total Protein: 6.9 g/dL (ref 6.5–8.1)

## 2018-04-16 MED ORDER — SODIUM CHLORIDE 0.9% FLUSH
10.0000 mL | Freq: Once | INTRAVENOUS | Status: AC
  Administered 2018-04-16: 10 mL
  Filled 2018-04-16: qty 10

## 2018-04-16 MED ORDER — SODIUM CHLORIDE 0.9% FLUSH
10.0000 mL | INTRAVENOUS | Status: DC | PRN
  Administered 2018-04-16: 10 mL
  Filled 2018-04-16: qty 10

## 2018-04-16 MED ORDER — PALONOSETRON HCL INJECTION 0.25 MG/5ML
INTRAVENOUS | Status: AC
  Filled 2018-04-16: qty 5

## 2018-04-16 MED ORDER — SODIUM CHLORIDE 0.9 % IV SOLN
600.0000 mg/m2 | Freq: Once | INTRAVENOUS | Status: AC
  Administered 2018-04-16: 1000 mg via INTRAVENOUS
  Filled 2018-04-16: qty 50

## 2018-04-16 MED ORDER — HEPARIN SOD (PORK) LOCK FLUSH 100 UNIT/ML IV SOLN
500.0000 [IU] | Freq: Once | INTRAVENOUS | Status: AC | PRN
  Administered 2018-04-16: 500 [IU]
  Filled 2018-04-16: qty 5

## 2018-04-16 MED ORDER — DOXORUBICIN HCL CHEMO IV INJECTION 2 MG/ML
60.0000 mg/m2 | Freq: Once | INTRAVENOUS | Status: AC
  Administered 2018-04-16: 100 mg via INTRAVENOUS
  Filled 2018-04-16: qty 50

## 2018-04-16 MED ORDER — PALONOSETRON HCL INJECTION 0.25 MG/5ML
0.2500 mg | Freq: Once | INTRAVENOUS | Status: AC
  Administered 2018-04-16: 0.25 mg via INTRAVENOUS

## 2018-04-16 MED ORDER — SODIUM CHLORIDE 0.9 % IV SOLN
Freq: Once | INTRAVENOUS | Status: AC
  Administered 2018-04-16: 14:00:00 via INTRAVENOUS
  Filled 2018-04-16: qty 5

## 2018-04-16 MED ORDER — SODIUM CHLORIDE 0.9 % IV SOLN
Freq: Once | INTRAVENOUS | Status: AC
  Administered 2018-04-16: 14:00:00 via INTRAVENOUS
  Filled 2018-04-16: qty 250

## 2018-04-16 MED FILL — VENLAFAXINE HCL ER 37.5 MG: 37.5 | 30 days supply | Qty: 30 | Fill #1

## 2018-04-16 NOTE — Patient Instructions (Signed)
[facility] Discharge Instructions for Patients Receiving Chemotherapy  Today you received the following chemotherapy agents Adriamycin, Cytoxan.  To help prevent nausea and vomiting after your treatment, we encourage you to take your nausea medication as prescribed.   If you develop nausea and vomiting that is not controlled by your nausea medication, call the clinic.   BELOW ARE SYMPTOMS THAT SHOULD BE REPORTED IMMEDIATELY:  *FEVER GREATER THAN 100.5 F  *CHILLS WITH OR WITHOUT FEVER  NAUSEA AND VOMITING THAT IS NOT CONTROLLED WITH YOUR NAUSEA MEDICATION  *UNUSUAL SHORTNESS OF BREATH  *UNUSUAL BRUISING OR BLEEDING  TENDERNESS IN MOUTH AND THROAT WITH OR WITHOUT PRESENCE OF ULCERS  *URINARY PROBLEMS  *BOWEL PROBLEMS  UNUSUAL RASH Items with * indicate a potential emergency and should be followed up as soon as possible.  Feel free to call the clinic should you have any questions or concerns. The clinic phone number is [phone].  Please show the CHEMO ALERT CARD at check-in to the Emergency Department and triage nurse.   

## 2018-04-16 NOTE — Progress Notes (Signed)
Patient Care Team: Patient, [name] as PCP - General (General Practice)  DIAGNOSIS:  Encounter Diagnosis  Name Primary?  . Malignant neoplasm of upper-outer quadrant of left breast in female, estrogen receptor negative ([name])     SUMMARY OF ONCOLOGIC HISTORY:   Malignant neoplasm of upper-outer quadrant of left breast in female, estrogen receptor negative ([place])   03/12/2018 Initial Diagnosis    Palpable masses in both breasts with family history of breast cancer, breast density category D, ultrasound measured these hypoechoic irregular mass left breast 2.3 cm, right breast no abnormality, biopsy left breast mass: IDC grade 3 ER 0%, PR 0%, HER-2 negative, Ki-67 90%, lymph node biopsy benign: T2N0    03/18/2018 Cancer Staging    Staging form: Breast, AJCC 8th Edition - Clinical: Stage IIB (cT2, cN0, cM0, G3, ER-, PR-, HER2-) - Signed by Nicholas Lose, MD on 03/18/2018    04/02/2018 -  Neo-Adjuvant Chemotherapy    Neoadjuvant chemotherapy with dose dense Adriamycin and Cytoxan followed by Taxol and carboplatin     CHIEF COMPLIANT: Cycle 2 dose dense Adriamycin and Cytoxan  INTERVAL HISTORY: Samantha Mcneil is a 32 year old with above-mentioned history of left breast cancer currently on neoadjuvant chemotherapy today cycle 2 of dose dense Adriamycin and Cytoxan.  She reports to be having some fatigue but denies any nausea vomiting  REVIEW OF SYSTEMS:   Constitutional: Denies fevers, chills or abnormal weight loss Eyes: Denies blurriness of vision Ears, nose, mouth, throat, and face: Denies mucositis or sore throat Respiratory: Denies cough, dyspnea or wheezes Cardiovascular: Denies palpitation, chest discomfort Gastrointestinal:  Denies nausea, heartburn or change in bowel habits Skin: Denies abnormal skin rashes Lymphatics: Denies new lymphadenopathy or easy bruising Neurological:Denies numbness, tingling or new weaknesses Behavioral/Psych: Mood is stable, no new changes    Extremities: No lower extremity edema Breast:  denies any pain or lumps or nodules in either breasts All other systems were reviewed with the patient and are negative.  I have reviewed the past medical history, past surgical history, social history and family history with the patient and they are unchanged from previous note.  ALLERGIES:  has No Known Allergies.  MEDICATIONS:  Current Outpatient Medications  Medication Sig Dispense Refill  . ibuprofen (ADVIL,MOTRIN) 200 MG tablet Take 200 mg by mouth every 6 (six) hours as needed.    . lidocaine-prilocaine (EMLA) cream Apply to affected area once 30 g 3  . LORazepam (ATIVAN) 0.5 MG tablet Take 1 tablet (0.5 mg total) by mouth at bedtime as needed for sleep. 30 tablet 0  . ondansetron (ZOFRAN) 8 MG tablet Take 1 tablet (8 mg total) by mouth 2 (two) times daily as needed. Start on the third day after chemotherapy. 30 tablet 1  . prochlorperazine (COMPAZINE) 10 MG tablet Take 1 tablet (10 mg total) by mouth every 6 (six) hours as needed (Nausea or vomiting). 30 tablet 1  . venlafaxine XR (EFFEXOR-XR) 37.5 MG 24 hr capsule Take 1 capsule (37.5 mg total) by mouth daily with breakfast. 30 capsule 6   No current facility-administered medications for this visit.     PHYSICAL EXAMINATION: ECOG PERFORMANCE STATUS: 1 - Symptomatic but completely ambulatory  Vitals:   04/16/18 1257  BP: 107/65  Pulse: 83  Resp: 18  Temp: 98.3 F (36.8 C)  SpO2: 100%   Filed Weights   04/16/18 1257  Weight: 138 lb 1.6 oz (62.6 kg)    GENERAL:alert, no distress and comfortable SKIN: skin color, texture, turgor are normal,  no rashes or significant lesions EYES: normal, Conjunctiva are pink and non-injected, sclera clear OROPHARYNX:no exudate, no erythema and lips, buccal mucosa, and tongue normal  NECK: supple, thyroid normal size, non-tender, without nodularity LYMPH:  no palpable lymphadenopathy in the cervical, axillary or inguinal LUNGS: clear to  auscultation and percussion with normal breathing effort HEART: regular rate & rhythm and no murmurs and no lower extremity edema ABDOMEN:abdomen soft, non-tender and normal bowel sounds MUSCULOSKELETAL:no cyanosis of digits and no clubbing  NEURO: alert & oriented x 3 with fluent speech, no focal motor/sensory deficits EXTREMITIES: No lower extremity edema   LABORATORY DATA:  I have reviewed the data as listed CMP Latest Ref Rng & Units 04/09/2018 03/30/2018 03/03/2018  Glucose 70 - 99 mg/dL 108(H) 94 95  BUN 6 - 20 mg/dL '11 9 9  ' Creatinine 0.44 - 1.00 mg/dL 0.60 0.66 0.63  Sodium 135 - 145 mmol/L 139 139 138  Potassium 3.5 - 5.1 mmol/L 3.8 4.4 5.0  Chloride 98 - 111 mmol/L 105 105 106  CO2 22 - 32 mmol/L '25 28 28  ' Calcium 8.9 - 10.3 mg/dL 9.4 9.1 9.4  Total Protein 6.5 - 8.1 g/dL 7.0 6.7 6.6  Total Bilirubin 0.3 - 1.2 mg/dL 0.5 0.4 0.4  Alkaline Phos 38 - 126 U/L 74 59 -  AST 15 - 41 U/L 13(L) 14(L) 12  ALT 0 - 44 U/L '22 14 11    ' Lab Results  Component Value Date   WBC 7.3 04/16/2018   HGB 10.7 (L) 04/16/2018   HCT 32.2 (L) 04/16/2018   MCV 84.5 04/16/2018   PLT 366 04/16/2018   NEUTROABS PENDING 04/16/2018    ASSESSMENT & PLAN:  Malignant neoplasm of upper-outer quadrant of left breast in female, estrogen receptor negative ([place]) negative ([place]) 03/12/2018:Palpable masses in both breasts with family history of breast cancer, breast density category D, ultrasound measured these hypoechoic irregular mass left breast 2.3 cm, right breast no abnormality, biopsy left breast mass: IDC grade 3 ER 0%, PR 0%, HER-2 negative, Ki-67 90%, lymph node biopsy benign: T2N0, stage IIb  Recommendationbased on multidisciplinary tumor board: Genetics consultation 1. Neoadjuvant chemotherapy with Adriamycin and Cytoxan dose dense 4 followed byTaxol with carboplatinweekly 12 2. Followed by breast conserving surgery with sentinel lymph node study vs targeted axillary dissection 3. Followed by  adjuvant radiation therapy ---------------------------------------------------------------------------------------------------------------- Current Treatment:Neoadjuvant chemotherapy with dose dense Adriamycin and Cytoxan.  Today is cycle 2 day 1  Chemo toxicities: 1.  Nausea and vomiting 2. neutropenic fever: Treated with Levaquin  Return to clinic in 2 weeks for cycle 3.  No orders of the defined types were placed in this encounter.  The patient has a good understanding of the overall plan. she agrees with it. she will call with any problems that may develop before the next visit here.   Harriette Ohara, MD 04/16/18

## 2018-04-16 NOTE — Assessment & Plan Note (Signed)
negative ([place]) 03/12/2018:Palpable masses in both breasts with family history of breast cancer, breast density category D, ultrasound measured these hypoechoic irregular mass left breast 2.3 cm, right breast no abnormality, biopsy left breast mass: IDC grade 3 ER 0%, PR 0%, HER-2 negative, Ki-67 90%, lymph node biopsy benign: T2N0, stage IIb  Recommendationbased on multidisciplinary tumor board: Genetics consultation 1. Neoadjuvant chemotherapy with Adriamycin and Cytoxan dose dense 4 followed byTaxol with carboplatinweekly 12 2. Followed by breast conserving surgery with sentinel lymph node study vs targeted axillary dissection 3. Followed by adjuvant radiation therapy ---------------------------------------------------------------------------------------------------------------- Current Treatment:Neoadjuvant chemotherapy with dose dense Adriamycin and Cytoxan.  Today is cycle 2 day 1  Chemo toxicities: 1.  Nausea and vomiting 2. neutropenic fever: Treated with Levaquin  Return to clinic in 2 weeks for cycle 3.

## 2018-04-17 ENCOUNTER — Telehealth: Payer: Self-pay | Admitting: Hematology and Oncology

## 2018-04-17 NOTE — Telephone Encounter (Signed)
Spoke to pt regarding upcoming appts. Pt scheduled with [facility] on days [facility] has no avail. Provider aware. Pt will pick up copy of sched 10/19

## 2018-04-18 ENCOUNTER — Inpatient Hospital Stay: Payer: BLUE CROSS/BLUE SHIELD

## 2018-04-18 VITALS — HR 86 | Temp 98.5°F | Resp 18

## 2018-04-18 DIAGNOSIS — Z171 Estrogen receptor negative status [ER-]: Principal | ICD-10-CM

## 2018-04-18 DIAGNOSIS — C50412 Malignant neoplasm of upper-outer quadrant of left female breast: Secondary | ICD-10-CM | POA: Diagnosis not present

## 2018-04-18 MED ORDER — PEGFILGRASTIM-CBQV 6 MG/0.6ML ~~LOC~~ SOSY
PREFILLED_SYRINGE | SUBCUTANEOUS | Status: AC
  Filled 2018-04-18: qty 0.6

## 2018-04-18 MED ORDER — PEGFILGRASTIM-CBQV 6 MG/0.6ML ~~LOC~~ SOSY
6.0000 mg | PREFILLED_SYRINGE | Freq: Once | SUBCUTANEOUS | Status: AC
  Administered 2018-04-18: 6 mg via SUBCUTANEOUS

## 2018-04-22 ENCOUNTER — Telehealth: Payer: Self-pay | Admitting: Hematology and Oncology

## 2018-04-22 NOTE — Telephone Encounter (Signed)
Printed medical records for pick-up, Release ID: [id number]

## 2018-04-24 ENCOUNTER — Telehealth: Payer: Self-pay | Admitting: Licensed Clinical Social Worker

## 2018-04-24 ENCOUNTER — Encounter: Payer: Self-pay | Admitting: Licensed Clinical Social Worker

## 2018-04-24 DIAGNOSIS — Z1379 Encounter for other screening for genetic and chromosomal anomalies: Secondary | ICD-10-CM | POA: Insufficient documentation

## 2018-04-24 NOTE — Telephone Encounter (Signed)
Revealed positive genetic testing: BRCA2 mutation. Revealed VUS in PALB2. We discussed the implications of the results briefly and scheduled an appointment to talk in more detail on Tuesday at 9.

## 2018-04-25 MED FILL — ONDANSETRON HCL 8 MG TABLET: 8 | 15 days supply | Qty: 30 | Fill #1

## 2018-04-28 ENCOUNTER — Encounter: Payer: Self-pay | Admitting: Licensed Clinical Social Worker

## 2018-04-28 ENCOUNTER — Inpatient Hospital Stay: Payer: BLUE CROSS/BLUE SHIELD | Admitting: Licensed Clinical Social Worker

## 2018-04-28 DIAGNOSIS — Z171 Estrogen receptor negative status [ER-]: Secondary | ICD-10-CM

## 2018-04-28 DIAGNOSIS — C50412 Malignant neoplasm of upper-outer quadrant of left female breast: Secondary | ICD-10-CM

## 2018-04-28 DIAGNOSIS — Z1501 Genetic susceptibility to malignant neoplasm of breast: Secondary | ICD-10-CM | POA: Insufficient documentation

## 2018-04-28 DIAGNOSIS — Z800 Family history of malignant neoplasm of digestive organs: Secondary | ICD-10-CM

## 2018-04-28 DIAGNOSIS — Z1509 Genetic susceptibility to other malignant neoplasm: Secondary | ICD-10-CM

## 2018-04-28 DIAGNOSIS — Z801 Family history of malignant neoplasm of trachea, bronchus and lung: Secondary | ICD-10-CM

## 2018-04-28 DIAGNOSIS — Z1379 Encounter for other screening for genetic and chromosomal anomalies: Secondary | ICD-10-CM

## 2018-04-28 DIAGNOSIS — Z803 Family history of malignant neoplasm of breast: Secondary | ICD-10-CM

## 2018-04-28 NOTE — Progress Notes (Signed)
GENETIC TEST RESULTS  Patient Name: Samantha Mcneil Patient Age: 32 y.o. Encounter Date: 04/28/2018  REFERRING PROVIDER: Stark Mcneil, Samantha Mcneil, Samantha Mcneil [id number]  Samantha Mcneil was seen in the [facility] clinic on 04/13/2018 due to a personal and family history of breast cancer and concern regarding a hereditary predisposition to cancer in the family. Please refer to the prior Genetics clinic note for more information regarding Samantha Mcneil medical and family histories and our assessment at the time.   FAMILY HISTORY:  We obtained a detailed, 4-generation family history.  Significant diagnoses are listed below: Family History  Problem Relation Age of Onset  . Heart disease Father        d. 92  . Breast cancer Maternal Aunt        d. 35  . Breast cancer Cousin        dx 76  . Liver cancer Maternal Grandmother        d. 42  . Lung cancer Maternal Grandfather        d. 42  . Breast cancer Cousin        dx 35s   Samantha Mcneil has a son, 88, and a daughter, 32. She has one brother, 81, who has a son.   Samantha Mcneil mother is 51 and was present at the session. She has 6 brothers and 5 sisters. One of her sisters had breast cancer and died at 1. One of her brother's died in an accident. The rest of her brothers and sisters are living and are cancer free. Samantha Mcneil has a maternal cousin who had breast cancer diagnosed at 41 and is 37. She had genetic testing that was negative. Samantha Mcneil maternal grandfather died of lung cancer at 63 and her maternal grandmother died of liver cancer at 64. Samantha Mcneil grandfather's sister had a child that was recently diagnosed with breast cancer at 37. Another one of Samantha Mcneil's grandfather's sisters had a granddaughter who was diagnosed with breast cancer in her 54's.   Samantha Mcneil father died at 90 due to heart issues. He had two sisters. No cancers on this side of the family that Samantha Mcneil is aware  of. She does not have information about her grandfather but believes her grandmother died of Alzheimer's.   Samantha Mcneil is aware of previous family history of genetic testing for hereditary cancer risks but does not have the report or details regarding this testing. Patient's maternal and paternal side of the family are from [place] There is no reported Ashkenazi Jewish ancestry. There is no known consanguinity.  Patient's maternal and paternal sides of the family are from [place] There is no reported Ashkenazi Jewish ancestry. There is no known consanguinity.  GENETIC TESTING:  At the time of Samantha Mcneil's visit, we recommended she pursue genetic testing. The genetic testing reported on 04/23/2018 through the Common Hereditary Cancers Panel offered by [facility] identified a single, heterozygous pathogenic gene mutation called BRCA2 [phone] (p.[id number]).  Genetic testing did detect a Variant of Unknown Significance in the PALB2 gene called c.205C>T (p.His69Tyr). At this time, it is unknown if this variant is associated with increased cancer risk or if this is a normal finding, but most variants such as this get reclassified to being inconsequential. It should not be used to make medical management decisions. With time, we suspect the lab will determine the significance of this variant, if any. If we do learn more about it, we  will try to contact Samantha Mcneil to discuss it further. However, it is important to stay in touch with [place] periodically and keep the address and phone number up to date.     MEDICAL MANAGEMENT for BRCA2:    The cancers associated with BRCA2 are:  Breast cancer, up to a 84% risk  Ovarian cancer, up to a 27% risk  Pancreatic cancer, 2-7% Prostate cancer, elevated  Melanoma, elevated  Second breast cancer: up to 12% within 5 years  We discussed that this result explains the breast cancer Samantha Mcneil currently has. We discussed with  the recommended options for women who have a BRCA2 pathogenic variant.  Some women elect to have a bilateral mastectomy.  Others pursue high risk breast screening that includes yearly mammograms, yearly breast MRI, twice-yearly clinical breast exams through a high-risk clinic, and monthly self-breast exams.    To reduce the risk for ovarian cancer, it is recommended women with BRCA2 pathogenic variants have a risk-reducing bilateral salpingo-oophorectomy (RRSO) when childbearing is completed, if planned. For Individuals with BRCA2 mutations this is recommended to occur between the ages of 35-40. According to NCCN guidelines, it is reasonable to delay RRSO to 40-45 for individuals with BRCA2 mutations given that the average onset for ovarian cancer is 8-10 years later than in patients with a BRCA1 mutations  We discussed that screening with CA-125 blood tests and transvaginal ultrasounds can be done twice per year. However, these tests have not been shown to detect ovarian cancer at an early stage.  Other Cancer Risks:  There are no specific NCCN guidelines regarding melanoma or pancreatic cancer screening for individuals with BRCA mutations.  However, sun protection is recommended, and routine skin exams by a dermatologist can be considered.  There are are no definitive guidelines regarding pancreatic cancer screening in individuals with BRCA2 mutations.  However, screening methods such as MRI/ERCP and EUS can be considered on a case by case basis. The effectiveness of these screening methods is still being studied and currently unknown.  These guidelines are based on current NCCN guidelines.  These guidelines are subject to change and continually updated and should be directly referenced for future medical management.      RISK REDUCTION: There are several things that can be offered to individuals who are carriers for BRCA2 mutations that will reduce the risk for getting cancer.    Chemoprevention can  reduce the risk of breast cancer in the contralateral breast in women with BRCA1 and BRCA2 mutations who have been diagnosed with breast cancer (PMID: 1497026, 37858850).  Oral contraceptive use has been shown to reduce the risk of ovarian cancer by approximately 60% in BRCA mutation carriers if taken for at least 5 years (PMID: 2774128).  Recent studies have some preliminary data that suggest PARP inhibitors may be a beneficial chemotherapeutic agent for a subset of patients with BRCA2-associated breast and ovarian cancers.   FAMILY MEMBERS: It is important that all of Ms. Klaas's relatives (both men and women) know of the presence of this gene mutation. Site-specific genetic testing can sort out who in the family is at risk and who is not.    Ms. Krysiak children are have a 50% chance to have inherited this mutation. However, they are relatively young and this will not be of any consequence to them for several years. We do not test children because there is no risk to them until they are adults. We recommend they have genetic counseling and testing by the time  they are in their early 27s.    Ms. Musa mother and brother have a 50% chance to have inherited this mutation. We recommend they have genetic testing for this same mutation, as identifying the presence of this mutation would allow them to also take advantage of risk-reducing measures.   PLAN:   1. These results will be made available to her care team: Dr. Barry Dienes, Dr. Lindi Adie, and Geralyn Flash, NP. She would like this team to follow her long-term for this indication and coordinate screening/risk-reducing surgeries.    2. Ms. Khalsa plans to discuss these results with her family. Her mother will be coming in this week for testing. The patient's husband requested a letter to be written to send to the patient's brother in [place] in order to help him make his case for being allowed to visit the [place]. I  also offered to look into testing options for him there.    SUPPORT AND RESOURCES: If Ms. Fahrner is interested in BRCA-specific information and support, there are two groups, Facing Our Risk (www.facingourrisk.com) and Bright Pink (www.brightpink.org) which some people have found useful. They provide opportunities to speak with other individuals from high-risk families. To locate genetic counselors in other cities, visit the website of the [employer] ([employer]) and Secretary/administrator for a Social worker by zip code.  We encouraged Ms. Rochon to remain in contact with [place] on an annual basis so we can update her personal and family histories, and let her know of advances in cancer genetics that may benefit the family. Our contact number was provided. Ms. Shareef questions were answered to her satisfaction today, and she knows she is welcome to call anytime with additional questions.   Faith Rogue, MS Genetic Counselor [email or web address] Phone: [phone]

## 2018-04-30 ENCOUNTER — Inpatient Hospital Stay: Payer: BLUE CROSS/BLUE SHIELD

## 2018-04-30 ENCOUNTER — Inpatient Hospital Stay (HOSPITAL_BASED_OUTPATIENT_CLINIC_OR_DEPARTMENT_OTHER): Payer: BLUE CROSS/BLUE SHIELD | Admitting: Hematology and Oncology

## 2018-04-30 ENCOUNTER — Encounter: Payer: Self-pay | Admitting: *Deleted

## 2018-04-30 DIAGNOSIS — Z1509 Genetic susceptibility to other malignant neoplasm: Secondary | ICD-10-CM | POA: Diagnosis not present

## 2018-04-30 DIAGNOSIS — Z171 Estrogen receptor negative status [ER-]: Principal | ICD-10-CM

## 2018-04-30 DIAGNOSIS — C50412 Malignant neoplasm of upper-outer quadrant of left female breast: Secondary | ICD-10-CM | POA: Diagnosis not present

## 2018-04-30 DIAGNOSIS — Z95828 Presence of other vascular implants and grafts: Secondary | ICD-10-CM

## 2018-04-30 LAB — CMP (CANCER CENTER ONLY)
ALT: 21 U/L (ref 0–44)
AST: 14 U/L — ABNORMAL LOW (ref 15–41)
Albumin: 3.7 g/dL (ref 3.5–5.0)
Alkaline Phosphatase: 96 U/L (ref 38–126)
Anion gap: 7 (ref 5–15)
BUN: 7 mg/dL (ref 6–20)
CO2: 26 mmol/L (ref 22–32)
Calcium: 8.9 mg/dL (ref 8.9–10.3)
Chloride: 109 mmol/L (ref 98–111)
Creatinine: 0.58 mg/dL (ref 0.44–1.00)
GFR, Est AFR Am: >60 mL/min (ref >60)
GFR, Estimated: >60 mL/min (ref >60)
Glucose, Bld: 100 mg/dL — ABNORMAL HIGH (ref 70–99)
Potassium: 3.9 mmol/L (ref 3.5–5.1)
Sodium: 142 mmol/L (ref 135–145)
Total Bilirubin: 0.2 mg/dL — ABNORMAL LOW (ref 0.3–1.2)
Total Protein: 6.5 g/dL (ref 6.5–8.1)

## 2018-04-30 LAB — CBC WITH DIFFERENTIAL (CANCER CENTER ONLY)
Abs Immature Granulocytes: 0.93 10*3/uL — ABNORMAL HIGH (ref 0.00–0.07)
Basophils Absolute: 0.1 10*3/uL (ref 0.0–0.1)
Basophils Relative: 1 %
Eosinophils Absolute: 0 10*3/uL (ref 0.0–0.5)
Eosinophils Relative: 0 %
HCT: 31.2 % — ABNORMAL LOW (ref 36.0–46.0)
Hemoglobin: 10.1 g/dL — ABNORMAL LOW (ref 12.0–15.0)
Immature Granulocytes: 11 %
Lymphocytes Relative: 16 %
Lymphs Abs: 1.4 10*3/uL (ref 0.7–4.0)
MCH: 28 pg (ref 26.0–34.0)
MCHC: 32.4 g/dL (ref 30.0–36.0)
MCV: 86.4 fL (ref 80.0–100.0)
Monocytes Absolute: 0.8 10*3/uL (ref 0.1–1.0)
Monocytes Relative: 9 %
Neutro Abs: 5.6 10*3/uL (ref 1.7–7.7)
Neutrophils Relative %: 63 %
Platelet Count: 120 10*3/uL — ABNORMAL LOW (ref 150–400)
RBC: 3.61 MIL/uL — ABNORMAL LOW (ref 3.87–5.11)
RDW: 13.2 % (ref 11.5–15.5)
WBC Count: 8.8 10*3/uL (ref 4.0–10.5)
nRBC: 0.2 % (ref 0.0–0.2)

## 2018-04-30 MED ORDER — HEPARIN SOD (PORK) LOCK FLUSH 100 UNIT/ML IV SOLN
500.0000 [IU] | Freq: Once | INTRAVENOUS | Status: AC | PRN
  Administered 2018-04-30: 500 [IU]
  Filled 2018-04-30: qty 5

## 2018-04-30 MED ORDER — DOXORUBICIN HCL CHEMO IV INJECTION 2 MG/ML
60.0000 mg/m2 | Freq: Once | INTRAVENOUS | Status: AC
  Administered 2018-04-30: 100 mg via INTRAVENOUS
  Filled 2018-04-30: qty 50

## 2018-04-30 MED ORDER — SODIUM CHLORIDE 0.9 % IV SOLN
600.0000 mg/m2 | Freq: Once | INTRAVENOUS | Status: AC
  Administered 2018-04-30: 1000 mg via INTRAVENOUS
  Filled 2018-04-30: qty 50

## 2018-04-30 MED ORDER — ACETAMINOPHEN 325 MG PO TABS
ORAL_TABLET | ORAL | Status: AC
  Filled 2018-04-30: qty 2

## 2018-04-30 MED ORDER — SODIUM CHLORIDE 0.9% FLUSH
10.0000 mL | Freq: Once | INTRAVENOUS | Status: AC
  Administered 2018-04-30: 10 mL
  Filled 2018-04-30: qty 10

## 2018-04-30 MED ORDER — SODIUM CHLORIDE 0.9 % IV SOLN
Freq: Once | INTRAVENOUS | Status: AC
  Administered 2018-04-30: 13:00:00 via INTRAVENOUS
  Filled 2018-04-30: qty 250

## 2018-04-30 MED ORDER — SODIUM CHLORIDE 0.9 % IV SOLN
Freq: Once | INTRAVENOUS | Status: AC
  Administered 2018-04-30: 13:00:00 via INTRAVENOUS
  Filled 2018-04-30: qty 5

## 2018-04-30 MED ORDER — PALONOSETRON HCL INJECTION 0.25 MG/5ML
0.2500 mg | Freq: Once | INTRAVENOUS | Status: AC
  Administered 2018-04-30: 0.25 mg via INTRAVENOUS

## 2018-04-30 MED ORDER — SODIUM CHLORIDE 0.9% FLUSH
10.0000 mL | INTRAVENOUS | Status: DC | PRN
  Administered 2018-04-30: 10 mL
  Filled 2018-04-30: qty 10

## 2018-04-30 MED ORDER — PALONOSETRON HCL INJECTION 0.25 MG/5ML
INTRAVENOUS | Status: AC
  Filled 2018-04-30: qty 5

## 2018-04-30 MED ORDER — ACETAMINOPHEN 325 MG PO TABS
650.0000 mg | ORAL_TABLET | Freq: Once | ORAL | Status: AC
  Administered 2018-04-30: 650 mg via ORAL

## 2018-04-30 NOTE — Assessment & Plan Note (Signed)
negative ([place]) 03/12/2018:Palpable masses in both breasts with family history of breast cancer, breast density category D, ultrasound measured these hypoechoic irregular mass left breast 2.3 cm, right breast no abnormality, biopsy left breast mass: IDC grade 3 ER 0%, PR 0%, HER-2 negative, Ki-67 90%, lymph node biopsy benign: T2N0, stage IIb  Recommendationbased on multidisciplinary tumor board: Genetics consultation 1. Neoadjuvant chemotherapy with Adriamycin and Cytoxan dose dense 4 followed byTaxol with carboplatinweekly 12 2. Followed by breast conserving surgery with sentinel lymph node study vs targeted axillary dissection 3. Followed by adjuvant radiation therapy  BRCA2 mutation: Patient contemplating on bilateral mastectomies with reconstruction Oophorectomy after the age of 18 ---------------------------------------------------------------------------------------------------------------- Current Treatment:Neoadjuvant chemotherapy with dose dense Adriamycin and Cytoxan.  Today is cycle 3 day 1  Chemo toxicities: 1.  Nausea and vomiting: Resolved 2. neutropenic fever: No longer a problem.  Return to clinic in 2 weeks for cycle 4.

## 2018-04-30 NOTE — Progress Notes (Signed)
Verbal order for Tylenol 650mg  given by Sandi Mealy, PA-C for preventive headache per patient request.

## 2018-04-30 NOTE — Progress Notes (Signed)
Patient Care Team: Patient, [name] as PCP - General (General Practice)  DIAGNOSIS:  Encounter Diagnosis  Name Primary?  . Malignant neoplasm of upper-outer quadrant of left breast in female, estrogen receptor negative ([place])     SUMMARY OF ONCOLOGIC HISTORY:   Malignant neoplasm of upper-outer quadrant of left breast in female, estrogen receptor negative ([place])   03/12/2018 Initial Diagnosis    Palpable masses in both breasts with family history of breast cancer, breast density category D, ultrasound measured these hypoechoic irregular mass left breast 2.3 cm, right breast no abnormality, biopsy left breast mass: IDC grade 3 ER 0%, PR 0%, HER-2 negative, Ki-67 90%, lymph node biopsy benign: T2N0    03/18/2018 Cancer Staging    Staging form: Breast, AJCC 8th Edition - Clinical: Stage IIB (cT2, cN0, cM0, G3, ER-, PR-, HER2-) - Signed by Nicholas Lose, MD on 03/18/2018    04/02/2018 -  Neo-Adjuvant Chemotherapy    Neoadjuvant chemotherapy with dose dense Adriamycin and Cytoxan followed by Taxol and carboplatin    04/23/2018 Genetic Testing    Positive genetic testing: A pathogenic variant was identified in BRCA2 called [id number] (p.Ala2951Glyfs*26) on the Common Hereditary Cancers Panel. The Common Hereditary Cancers Panel offered by [facility] includes sequencing and/or deletion duplication testing of the following 47 genes: APC, ATM, AXIN2, BARD1, BMPR1A, BRCA1, BRCA2, BRIP1, CDH1, CDKN2A (p14ARF), CDKN2A (p16INK4a), CKD4, CHEK2, CTNNA1, DICER1, EPCAM (Deletion/duplication testing only), GREM1 (promoter region deletion/duplication testing only), KIT, MEN1, MLH1, MSH2, MSH3, MSH6, MUTYH, NBN, NF1, NHTL1, PALB2, PDGFRA, PMS2, POLD1, POLE, PTEN, RAD50, RAD51C, RAD51D, SDHB, SDHC, SDHD, SMAD4, SMARCA4. STK11, TP53, TSC1, TSC2, and VHL.  The following genes were evaluated for sequence changes only: SDHA and HOXB13 c.251G>A variant only. Genetic testing did detect a Variant of Unknown  Significance (VUS) in the PALB2 gene called c.205C>T.  At this time, it is unknown if this variant is associated with increased cancer risk or if this is a normal finding, but most variants such as this get reclassified to being inconsequential. It should not be used to make medical management decisions.   The report date is 04/23/2018.      CHIEF COMPLIANT: Cycle 3 dose dense Adriamycin and Cytoxan  INTERVAL HISTORY: Skyelynn Rambeau is a 32 year old with above-mentioned history of left breast cancer currently neoadjuvant chemotherapy and Cytoxan.  She is tolerating the chemo extremely well.  Genetic testing revealed BRCA2 mutation.  She is here today accompanied by her husband and mother to discuss treatment options for this.  REVIEW OF SYSTEMS:   Constitutional: Denies fevers, chills or abnormal weight loss Eyes: Denies blurriness of vision Ears, nose, mouth, throat, and face: Denies mucositis or sore throat Respiratory: Denies cough, dyspnea or wheezes Cardiovascular: Denies palpitation, chest discomfort Gastrointestinal:  Denies nausea, heartburn or change in bowel habits Skin: Denies abnormal skin rashes Lymphatics: Denies new lymphadenopathy or easy bruising Neurological:Denies numbness, tingling or new weaknesses Behavioral/Psych: Mood is stable, no new changes  Extremities: No lower extremity edema   All other systems were reviewed with the patient and are negative.  I have reviewed the past medical history, past surgical history, social history and family history with the patient and they are unchanged from previous note.  ALLERGIES:  has No Known Allergies.  MEDICATIONS:  Current Outpatient Medications  Medication Sig Dispense Refill  . ibuprofen (ADVIL,MOTRIN) 200 MG tablet Take 200 mg by mouth every 6 (six) hours as needed.    . lidocaine-prilocaine (EMLA) cream Apply to affected area once 30  g 3  . LORazepam (ATIVAN) 0.5 MG tablet Take 1 tablet (0.5 mg total) by mouth  at bedtime as needed for sleep. 30 tablet 0  . ondansetron (ZOFRAN) 8 MG tablet Take 1 tablet (8 mg total) by mouth 2 (two) times daily as needed. Start on the third day after chemotherapy. 30 tablet 1  . prochlorperazine (COMPAZINE) 10 MG tablet Take 1 tablet (10 mg total) by mouth every 6 (six) hours as needed (Nausea or vomiting). 30 tablet 1  . venlafaxine XR (EFFEXOR-XR) 37.5 MG 24 hr capsule Take 1 capsule (37.5 mg total) by mouth daily with breakfast. 30 capsule 6   No current facility-administered medications for this visit.    Facility-Administered Medications Ordered in Other Visits  Medication Dose Route Frequency Provider Last Rate Last Dose  . cyclophosphamide (CYTOXAN) 1,000 mg in sodium chloride 0.9 % 250 mL chemo infusion  600 mg/m2 (Treatment Plan Recorded) Intravenous Once Nicholas Lose, MD      . DOXOrubicin (ADRIAMYCIN) chemo injection 100 mg  60 mg/m2 (Treatment Plan Recorded) Intravenous Once Nicholas Lose, MD      . heparin lock flush 100 unit/mL  500 Units Intracatheter Once PRN Nicholas Lose, MD      . sodium chloride flush (NS) 0.9 % injection 10 mL  10 mL Intracatheter PRN Nicholas Lose, MD        PHYSICAL EXAMINATION: ECOG PERFORMANCE STATUS: 1 - Symptomatic but completely ambulatory  Vitals:   04/30/18 1140  BP: 111/75  Pulse: 82  Resp: 18  Temp: 98.9 F (37.2 C)  SpO2: 100%   Filed Weights   04/30/18 1140  Weight: 141 lb (64 kg)    GENERAL:alert, no distress and comfortable SKIN: skin color, texture, turgor are normal, no rashes or significant lesions EYES: normal, Conjunctiva are pink and non-injected, sclera clear OROPHARYNX:no exudate, no erythema and lips, buccal mucosa, and tongue normal  NECK: supple, thyroid normal size, non-tender, without nodularity LYMPH:  no palpable lymphadenopathy in the cervical, axillary or inguinal LUNGS: clear to auscultation and percussion with normal breathing effort HEART: regular rate & rhythm and no murmurs  and no lower extremity edema ABDOMEN:abdomen soft, non-tender and normal bowel sounds MUSCULOSKELETAL:no cyanosis of digits and no clubbing  NEURO: alert & oriented x 3 with fluent speech, no focal motor/sensory deficits EXTREMITIES: No lower extremity edema   LABORATORY DATA:  I have reviewed the data as listed CMP Latest Ref Rng & Units 04/30/2018 04/16/2018 04/09/2018  Glucose 70 - 99 mg/dL 100(H) 91 108(H)  BUN 6 - 20 mg/dL _0 Creatinine 0.44 - 1.00 mg/dL 0.58 0.60 0.60  Sodium 135 - 145 mmol/L 142 141 139  Potassium 3.5 - 5.1 mmol/L 3.9 4.3 3.8  Chloride 98 - 111 mmol/L 109 107 105  CO2 22 - 32 mmol/L _1 Calcium 8.9 - 10.3 mg/dL 8.9 9.4 9.4  Total Protein 6.5 - 8.1 g/dL 6.5 6.9 7.0  Total Bilirubin 0.3 - 1.2 mg/dL 0.2(L) <0.2(L) 0.5  Alkaline Phos 38 - 126 U/L 96 78 74  AST 15 - 41 U/L 14(L) 16 13(L)  ALT 0 - 44 U/L _2 Lab Results  Component Value Date   WBC 8.8 04/30/2018   HGB 10.1 (L) 04/30/2018   HCT 31.2 (L) 04/30/2018   MCV 86.4 04/30/2018   PLT 120 (L) 04/30/2018   NEUTROABS 5.6 04/30/2018    ASSESSMENT & PLAN:  Malignant neoplasm of upper-outer quadrant of left breast  in female, estrogen receptor negative ([place]) negative ([place]) 03/12/2018:Palpable masses in both breasts with family history of breast cancer, breast density category D, ultrasound measured these hypoechoic irregular mass left breast 2.3 cm, right breast no abnormality, biopsy left breast mass: IDC grade 3 ER 0%, PR 0%, HER-2 negative, Ki-67 90%, lymph node biopsy benign: T2N0, stage IIb  Recommendationbased on multidisciplinary tumor board: Genetics consultation 1. Neoadjuvant chemotherapy with Adriamycin and Cytoxan dose dense 4 followed byTaxol with carboplatinweekly 12 2. Followed by breast conserving surgery with sentinel lymph node study vs targeted axillary dissection 3. Followed by adjuvant radiation therapy  BRCA2 mutation: Patient contemplating on bilateral  mastectomies with reconstruction Oophorectomy after the age of 35 ---------------------------------------------------------------------------------------------------------------- Current Treatment:Neoadjuvant chemotherapy with dose dense Adriamycin and Cytoxan.  Today is cycle 3 day 1  Chemo toxicities: 1.  Nausea and vomiting: Resolved 2. neutropenic fever: No longer a problem. Patient has extraordinary amount of appetite and has been eating voraciously. Discussed with her about limiting carbohydrates.  Return to clinic in 2 weeks for cycle 4.    No orders of the defined types were placed in this encounter.  The patient has a good understanding of the overall plan. she agrees with it. she will call with any problems that may develop before the next visit here.   Harriette Ohara, MD 04/30/18

## 2018-04-30 NOTE — Patient Instructions (Signed)
[facility] Discharge Instructions for Patients Receiving Chemotherapy  Today you received the following chemotherapy agents Adriamycin, Cytoxan.  To help prevent nausea and vomiting after your treatment, we encourage you to take your nausea medication as prescribed.   If you develop nausea and vomiting that is not controlled by your nausea medication, call the clinic.   BELOW ARE SYMPTOMS THAT SHOULD BE REPORTED IMMEDIATELY:  *FEVER GREATER THAN 100.5 F  *CHILLS WITH OR WITHOUT FEVER  NAUSEA AND VOMITING THAT IS NOT CONTROLLED WITH YOUR NAUSEA MEDICATION  *UNUSUAL SHORTNESS OF BREATH  *UNUSUAL BRUISING OR BLEEDING  TENDERNESS IN MOUTH AND THROAT WITH OR WITHOUT PRESENCE OF ULCERS  *URINARY PROBLEMS  *BOWEL PROBLEMS  UNUSUAL RASH Items with * indicate a potential emergency and should be followed up as soon as possible.  Feel free to call the clinic should you have any questions or concerns. The clinic phone number is [phone].  Please show the CHEMO ALERT CARD at check-in to the Emergency Department and triage nurse.   

## 2018-05-02 ENCOUNTER — Inpatient Hospital Stay: Payer: BLUE CROSS/BLUE SHIELD | Attending: Hematology and Oncology

## 2018-05-02 ENCOUNTER — Telehealth: Payer: Self-pay | Admitting: Emergency Medicine

## 2018-05-02 VITALS — BP 112/69 | HR 78 | Temp 98.5°F | Resp 18

## 2018-05-02 DIAGNOSIS — Z171 Estrogen receptor negative status [ER-]: Secondary | ICD-10-CM | POA: Diagnosis not present

## 2018-05-02 DIAGNOSIS — R229 Localized swelling, mass and lump, unspecified: Secondary | ICD-10-CM | POA: Diagnosis not present

## 2018-05-02 DIAGNOSIS — Z5189 Encounter for other specified aftercare: Secondary | ICD-10-CM | POA: Insufficient documentation

## 2018-05-02 DIAGNOSIS — F329 Major depressive disorder, single episode, unspecified: Secondary | ICD-10-CM | POA: Insufficient documentation

## 2018-05-02 DIAGNOSIS — C50412 Malignant neoplasm of upper-outer quadrant of left female breast: Secondary | ICD-10-CM | POA: Diagnosis not present

## 2018-05-02 DIAGNOSIS — Z1501 Genetic susceptibility to malignant neoplasm of breast: Secondary | ICD-10-CM | POA: Insufficient documentation

## 2018-05-02 DIAGNOSIS — Z5111 Encounter for antineoplastic chemotherapy: Secondary | ICD-10-CM | POA: Diagnosis not present

## 2018-05-02 DIAGNOSIS — F419 Anxiety disorder, unspecified: Secondary | ICD-10-CM | POA: Insufficient documentation

## 2018-05-02 MED ORDER — PEGFILGRASTIM-CBQV 6 MG/0.6ML ~~LOC~~ SOSY
6.0000 mg | PREFILLED_SYRINGE | Freq: Once | SUBCUTANEOUS | Status: AC
  Administered 2018-05-02: 6 mg via SUBCUTANEOUS

## 2018-05-02 NOTE — Patient Instructions (Signed)
Pegfilgrastim injection What is this medicine? PEGFILGRASTIM (PEG fil gra stim) is a long-acting granulocyte colony-stimulating factor that stimulates the growth of neutrophils, a type of white blood cell important in the body's fight against infection. It is used to reduce the incidence of fever and infection in patients with certain types of cancer who are receiving chemotherapy that affects the bone marrow, and to increase survival after being exposed to high doses of radiation. This medicine may be used for other purposes; ask your health care provider or pharmacist if you have questions. COMMON BRAND NAME(S): Neulasta What should I tell my health care provider before I take this medicine? They need to know if you have any of these conditions: -kidney disease -latex allergy -ongoing radiation therapy -sickle cell disease -skin reactions to acrylic adhesives (On-Body Injector only) -an unusual or allergic reaction to pegfilgrastim, filgrastim, other medicines, foods, dyes, or preservatives -pregnant or trying to get pregnant -breast-feeding How should I use this medicine? This medicine is for injection under the skin. If you get this medicine at home, you will be taught how to prepare and give the pre-filled syringe or how to use the On-body Injector. Refer to the patient Instructions for Use for detailed instructions. Use exactly as directed. Tell your healthcare provider immediately if you suspect that the On-body Injector may not have performed as intended or if you suspect the use of the On-body Injector resulted in a missed or partial dose. It is important that you put your used needles and syringes in a special sharps container. Do not put them in a trash can. If you do not have a sharps container, call your pharmacist or healthcare provider to get one. Talk to your pediatrician regarding the use of this medicine in children. While this drug may be prescribed for selected conditions,  precautions do apply. Overdosage: If you think you have taken too much of this medicine contact a poison control center or emergency room at once. NOTE: This medicine is only for you. Do not share this medicine with others. What if I miss a dose? It is important not to miss your dose. Call your doctor or health care professional if you miss your dose. If you miss a dose due to an On-body Injector failure or leakage, a new dose should be administered as soon as possible using a single prefilled syringe for manual use. What may interact with this medicine? Interactions have not been studied. Give your health care provider a list of all the medicines, herbs, non-prescription drugs, or dietary supplements you use. Also tell them if you smoke, drink alcohol, or use illegal drugs. Some items may interact with your medicine. This list may not describe all possible interactions. Give your health care provider a list of all the medicines, herbs, non-prescription drugs, or dietary supplements you use. Also tell them if you smoke, drink alcohol, or use illegal drugs. Some items may interact with your medicine. What should I watch for while using this medicine? You may need blood work done while you are taking this medicine. If you are going to need a MRI, CT scan, or other procedure, tell your doctor that you are using this medicine (On-Body Injector only). What side effects may I notice from receiving this medicine? Side effects that you should report to your doctor or health care professional as soon as possible: -allergic reactions like skin rash, itching or hives, swelling of the face, lips, or tongue -dizziness -fever -pain, redness, or irritation at site   where injected -pinpoint red spots on the skin -red or dark-brown urine -shortness of breath or breathing problems -stomach or side pain, or pain at the shoulder -swelling -tiredness -trouble passing urine or change in the amount of urine Side  effects that usually do not require medical attention (report to your doctor or health care professional if they continue or are bothersome): -bone pain -muscle pain This list may not describe all possible side effects. Call your doctor for medical advice about side effects. You may report side effects to FDA at 1-800-FDA-1088. Where should I keep my medicine? Keep out of the reach of children. Store pre-filled syringes in a refrigerator between 2 and 8 degrees C (36 and 46 degrees F). Do not freeze. Keep in carton to protect from light. Throw away this medicine if it is left out of the refrigerator for more than 48 hours. Throw away any unused medicine after the expiration date. NOTE: This sheet is a summary. It may not cover all possible information. If you have questions about this medicine, talk to your doctor, pharmacist, or health care provider.  2018 Elsevier/Gold Standard (2016-06-13 12:58:03)  

## 2018-05-02 NOTE — Telephone Encounter (Signed)
Called pt's phone multiple times, no answer.  Unable to leave a VM.  Pt to receive udenyca injection today.

## 2018-05-14 ENCOUNTER — Other Ambulatory Visit: Payer: Self-pay | Admitting: Adult Health

## 2018-05-14 ENCOUNTER — Inpatient Hospital Stay: Payer: BLUE CROSS/BLUE SHIELD

## 2018-05-14 ENCOUNTER — Inpatient Hospital Stay (HOSPITAL_BASED_OUTPATIENT_CLINIC_OR_DEPARTMENT_OTHER): Payer: BLUE CROSS/BLUE SHIELD | Admitting: Adult Health

## 2018-05-14 ENCOUNTER — Encounter: Payer: Self-pay | Admitting: Adult Health

## 2018-05-14 VITALS — BP 100/46 | HR 74 | Temp 98.1°F | Resp 18 | Ht 64.0 in | Wt 142.5 lb

## 2018-05-14 DIAGNOSIS — Z1501 Genetic susceptibility to malignant neoplasm of breast: Secondary | ICD-10-CM | POA: Diagnosis not present

## 2018-05-14 DIAGNOSIS — R229 Localized swelling, mass and lump, unspecified: Secondary | ICD-10-CM

## 2018-05-14 DIAGNOSIS — C50412 Malignant neoplasm of upper-outer quadrant of left female breast: Secondary | ICD-10-CM | POA: Diagnosis not present

## 2018-05-14 DIAGNOSIS — Z1509 Genetic susceptibility to other malignant neoplasm: Secondary | ICD-10-CM

## 2018-05-14 DIAGNOSIS — F419 Anxiety disorder, unspecified: Secondary | ICD-10-CM

## 2018-05-14 DIAGNOSIS — F329 Major depressive disorder, single episode, unspecified: Secondary | ICD-10-CM

## 2018-05-14 DIAGNOSIS — N6332 Unspecified lump in axillary tail of the left breast: Secondary | ICD-10-CM

## 2018-05-14 DIAGNOSIS — Z95828 Presence of other vascular implants and grafts: Secondary | ICD-10-CM

## 2018-05-14 DIAGNOSIS — Z171 Estrogen receptor negative status [ER-]: Principal | ICD-10-CM

## 2018-05-14 LAB — CMP (CANCER CENTER ONLY)
ALT: 19 U/L (ref 0–44)
AST: 15 U/L (ref 15–41)
Albumin: 3.7 g/dL (ref 3.5–5.0)
Alkaline Phosphatase: 100 U/L (ref 38–126)
Anion gap: 9 (ref 5–15)
BUN: 10 mg/dL (ref 6–20)
CO2: 27 mmol/L (ref 22–32)
Calcium: 9.2 mg/dL (ref 8.9–10.3)
Chloride: 106 mmol/L (ref 98–111)
Creatinine: 0.62 mg/dL (ref 0.44–1.00)
GFR, Est AFR Am: >60 mL/min (ref >60)
GFR, Estimated: >60 mL/min (ref >60)
Glucose, Bld: 87 mg/dL (ref 70–99)
Potassium: 3.9 mmol/L (ref 3.5–5.1)
Sodium: 142 mmol/L (ref 135–145)
Total Bilirubin: <0.2 mg/dL — ABNORMAL LOW (ref 0.3–1.2)
Total Protein: 6.5 g/dL (ref 6.5–8.1)

## 2018-05-14 LAB — CBC WITH DIFFERENTIAL (CANCER CENTER ONLY)
Abs Immature Granulocytes: 0.7 10*3/uL — ABNORMAL HIGH (ref 0.00–0.07)
Basophils Absolute: 0.1 10*3/uL (ref 0.0–0.1)
Basophils Relative: 1 %
Eosinophils Absolute: 0 10*3/uL (ref 0.0–0.5)
Eosinophils Relative: 0 %
HCT: 28.6 % — ABNORMAL LOW (ref 36.0–46.0)
Hemoglobin: 9.6 g/dL — ABNORMAL LOW (ref 12.0–15.0)
Immature Granulocytes: 8 %
Lymphocytes Relative: 17 %
Lymphs Abs: 1.4 10*3/uL (ref 0.7–4.0)
MCH: 28.5 pg (ref 26.0–34.0)
MCHC: 33.6 g/dL (ref 30.0–36.0)
MCV: 84.9 fL (ref 80.0–100.0)
Monocytes Absolute: 1 10*3/uL (ref 0.1–1.0)
Monocytes Relative: 11 %
Neutro Abs: 5.5 10*3/uL (ref 1.7–7.7)
Neutrophils Relative %: 63 %
Platelet Count: 226 10*3/uL (ref 150–400)
RBC: 3.37 MIL/uL — ABNORMAL LOW (ref 3.87–5.11)
RDW: 14.4 % (ref 11.5–15.5)
WBC Count: 8.7 10*3/uL (ref 4.0–10.5)
nRBC: 0.6 % — ABNORMAL HIGH (ref 0.0–0.2)

## 2018-05-14 MED ORDER — SODIUM CHLORIDE 0.9 % IV SOLN
Freq: Once | INTRAVENOUS | Status: AC
  Administered 2018-05-14: 10:00:00 via INTRAVENOUS
  Filled 2018-05-14: qty 5

## 2018-05-14 MED ORDER — SODIUM CHLORIDE 0.9% FLUSH
10.0000 mL | Freq: Once | INTRAVENOUS | Status: AC
  Administered 2018-05-14: 10 mL
  Filled 2018-05-14: qty 10

## 2018-05-14 MED ORDER — PALONOSETRON HCL INJECTION 0.25 MG/5ML
INTRAVENOUS | Status: AC
  Filled 2018-05-14: qty 5

## 2018-05-14 MED ORDER — SODIUM CHLORIDE 0.9% FLUSH
10.0000 mL | INTRAVENOUS | Status: DC | PRN
  Filled 2018-05-14: qty 10

## 2018-05-14 MED ORDER — MAGIC MOUTHWASH: 5.0000 mL | Freq: Four times a day (QID) | ORAL | 0 refills | Status: DC | PRN

## 2018-05-14 MED ORDER — PALONOSETRON HCL INJECTION 0.25 MG/5ML
0.2500 mg | Freq: Once | INTRAVENOUS | Status: AC
  Administered 2018-05-14: 0.25 mg via INTRAVENOUS

## 2018-05-14 MED ORDER — SODIUM CHLORIDE 0.9 % IV SOLN
Freq: Once | INTRAVENOUS | Status: AC
  Administered 2018-05-14: 10:00:00 via INTRAVENOUS
  Filled 2018-05-14: qty 250

## 2018-05-14 MED ORDER — HEPARIN SOD (PORK) LOCK FLUSH 100 UNIT/ML IV SOLN
500.0000 [IU] | Freq: Once | INTRAVENOUS | Status: DC | PRN
  Filled 2018-05-14: qty 5

## 2018-05-14 MED ORDER — SODIUM CHLORIDE 0.9 % IV SOLN
600.0000 mg/m2 | Freq: Once | INTRAVENOUS | Status: AC
  Administered 2018-05-14: 1000 mg via INTRAVENOUS
  Filled 2018-05-14: qty 50

## 2018-05-14 MED ORDER — DOXORUBICIN HCL CHEMO IV INJECTION 2 MG/ML
60.0000 mg/m2 | Freq: Once | INTRAVENOUS | Status: AC
  Administered 2018-05-14: 100 mg via INTRAVENOUS
  Filled 2018-05-14: qty 50

## 2018-05-14 MED FILL — MAGIC MTHWASH W/LIDOCAINE: 12 days supply | Qty: 240 | Fill #0

## 2018-05-14 NOTE — Assessment & Plan Note (Addendum)
negative ([place]) 03/12/2018:Palpable masses in both breasts with family history of breast cancer, breast density category D, ultrasound measured these hypoechoic irregular mass left breast 2.3 cm, right breast no abnormality, biopsy left breast mass: IDC grade 3 ER 0%, PR 0%, HER-2 negative, Ki-67 90%, lymph node biopsy benign: T2N0, stage IIb  Recommendationbased on multidisciplinary tumor board: Genetics consultation 1. Neoadjuvant chemotherapy with Adriamycin and Cytoxan dose dense 4 followed byTaxol with carboplatinweekly 12 2. Followed by breast conserving surgery with sentinel lymph node study vs targeted axillary dissection 3. Followed by adjuvant radiation therapy  BRCA2 mutation: Patient contemplating on bilateral mastectomies with reconstruction Oophorectomy after the age of 84 ---------------------------------------------------------------------------------------------------------------- Current Treatment:Neoadjuvant chemotherapy with dose dense Adriamycin and Cytoxan.  Today is cycle 4 day 1  Chemo toxicities: 1.  Nausea and vomiting: Resolved 2. neutropenic fever: No longer a problem. 3. Axillary nodules: Examined also by Dr. Lindi Adie, he thinks they are sebaceous cysts, if still present at appt on 11/27 will get axillary ultrasounds/biopsy 4. Labial "rash": This is an area where the labia has a small amount of eroded skin.  There is nothing substantial enough for me to be able to culture with a viral culture.  I will get HSV 1 and 2 IGM and IGG levels to evaluate whether Valtrex should/could be given. 5. Headaches associated with frequent menses: Reviewed option of Goserelin.  Gave her info in [facility], she will see how her menses go, and read the information. 6. Mucositis: nothing visualized on exam, however patient requests magic mouthwash and we will send this in for her   Return to clinic in 2 weeks to start Taxol/Carbo

## 2018-05-14 NOTE — Patient Instructions (Signed)
[facility] Discharge Instructions for Patients Receiving Chemotherapy  Today you received the following chemotherapy agents Adriamycin, Cytoxan.  To help prevent nausea and vomiting after your treatment, we encourage you to take your nausea medication as prescribed.   If you develop nausea and vomiting that is not controlled by your nausea medication, call the clinic.   BELOW ARE SYMPTOMS THAT SHOULD BE REPORTED IMMEDIATELY:  *FEVER GREATER THAN 100.5 F  *CHILLS WITH OR WITHOUT FEVER  NAUSEA AND VOMITING THAT IS NOT CONTROLLED WITH YOUR NAUSEA MEDICATION  *UNUSUAL SHORTNESS OF BREATH  *UNUSUAL BRUISING OR BLEEDING  TENDERNESS IN MOUTH AND THROAT WITH OR WITHOUT PRESENCE OF ULCERS  *URINARY PROBLEMS  *BOWEL PROBLEMS  UNUSUAL RASH Items with * indicate a potential emergency and should be followed up as soon as possible.  Feel free to call the clinic should you have any questions or concerns. The clinic phone number is [phone].  Please show the CHEMO ALERT CARD at check-in to the Emergency Department and triage nurse.   

## 2018-05-14 NOTE — Progress Notes (Addendum)
[facility] Follow up:    Patient, [name] No address on file   DIAGNOSIS: Cancer Staging Malignant neoplasm of upper-outer quadrant of left breast in female, estrogen receptor negative ([place]) Staging form: Breast, AJCC 8th Edition - Clinical: Stage IIB (cT2, cN0, cM0, G3, ER-, PR-, HER2-) - Signed by Nicholas Lose, MD on 03/18/2018   SUMMARY OF ONCOLOGIC HISTORY:   Malignant neoplasm of upper-outer quadrant of left breast in female, estrogen receptor negative ([place])   03/12/2018 Initial Diagnosis    Palpable masses in both breasts with family history of breast cancer, breast density category D, ultrasound measured these hypoechoic irregular mass left breast 2.3 cm, right breast no abnormality, biopsy left breast mass: IDC grade 3 ER 0%, PR 0%, HER-2 negative, Ki-67 90%, lymph node biopsy benign: T2N0    03/18/2018 Cancer Staging    Staging form: Breast, AJCC 8th Edition - Clinical: Stage IIB (cT2, cN0, cM0, G3, ER-, PR-, HER2-) - Signed by Nicholas Lose, MD on 03/18/2018    03/30/2018 Breast MRI    Single biopsy-proven malignancy in the left 12 o'clock breast measures 2.3 x 1.3 x 1.5 cm.  Right breast without any malignancy detected.    04/02/2018 -  Neo-Adjuvant Chemotherapy    Neoadjuvant chemotherapy with dose dense Adriamycin and Cytoxan followed by Taxol and carboplatin    04/23/2018 Genetic Testing    Positive genetic testing: A pathogenic variant was identified in BRCA2 called [id number] (p.Ala2951Glyfs*26) on the Common Hereditary Cancers Panel. The Common Hereditary Cancers Panel offered by [facility] includes sequencing and/or deletion duplication testing of the following 47 genes: APC, ATM, AXIN2, BARD1, BMPR1A, BRCA1, BRCA2, BRIP1, CDH1, CDKN2A (p14ARF), CDKN2A (p16INK4a), CKD4, CHEK2, CTNNA1, DICER1, EPCAM (Deletion/duplication testing only), GREM1 (promoter region deletion/duplication testing only), KIT, MEN1, MLH1, MSH2, MSH3, MSH6, MUTYH, NBN, NF1,  NHTL1, PALB2, PDGFRA, PMS2, POLD1, POLE, PTEN, RAD50, RAD51C, RAD51D, SDHB, SDHC, SDHD, SMAD4, SMARCA4. STK11, TP53, TSC1, TSC2, and VHL.  The following genes were evaluated for sequence changes only: SDHA and HOXB13 c.251G>A variant only. Genetic testing did detect a Variant of Unknown Significance (VUS) in the PALB2 gene called c.205C>T.  At this time, it is unknown if this variant is associated with increased cancer risk or if this is a normal finding, but most variants such as this get reclassified to being inconsequential. It should not be used to make medical management decisions.   The report date is 04/23/2018.      CURRENT THERAPY: Adriamycin/Cytoxan cycle 4  INTERVAL HISTORY: Samantha Mcneil 32 y.o. female returns for evaluation prior to receiving her fourth cycle of neoadjuvant adriamycin/cytoxan.  She denies nausea vomiting and is eating and drinking well.  She has some dry skin she is applying vaseline to, and also notes some mucositis she wants me to look at.  Samantha Mcneil also notes headaches that are associated with her menstrual cycle.  She has had two menstrual cycles in the past month, so these have been challenging for her.     Patient Active Problem List   Diagnosis Date Noted  . BRCA2 gene mutation positive 04/28/2018  . Genetic testing 04/24/2018  . Family history of breast cancer   . Family history of lung cancer   . Family history of liver cancer   . Port-A-Cath in place 04/09/2018  . Malignant neoplasm of upper-outer quadrant of left breast in female, estrogen receptor negative ([place]) 03/18/2018    has No Known Allergies.  MEDICAL HISTORY: Past Medical History:  Diagnosis Date  .  Anxiety   . Depression   . Family history of breast cancer   . Family history of liver cancer   . Family history of lung cancer   . Headache    migraines  . History of kidney stones     SURGICAL HISTORY: Past Surgical History:  Procedure Laterality Date  . CESAREAN SECTION    .  OTHER SURGICAL HISTORY    . PORTACATH PLACEMENT Left 04/01/2018   Procedure: INSERTION PORT-A-CATH;  Surgeon: [name], MD;  Location: [place];  Service: General;  Laterality: Left;    SOCIAL HISTORY: Social History   Socioeconomic History  . Marital status: Married    Spouse name: Not on file  . Number of children: Not on file  . Years of education: Not on file  . Highest education level: Not on file  Occupational History  . Not on file  Social Needs  . Financial resource strain: Not on file  . Food insecurity:    Worry: Not on file    Inability: Not on file  . Transportation needs:    Medical: Not on file    Non-medical: Not on file  Tobacco Use  . Smoking status: Never Smoker  . Smokeless tobacco: Never Used  Substance and Sexual Activity  . Alcohol use: Yes    Comment: Rare  . Drug use: Never  . Sexual activity: Yes    Birth control/protection: None    Comment: 1st intercourse 32 yo-Fewer than 5 partners  Lifestyle  . Physical activity:    Days per week: Not on file    Minutes per session: Not on file  . Stress: Not on file  Relationships  . Social connections:    Talks on phone: Not on file    Gets together: Not on file    Attends religious service: Not on file    Active member of club or organization: Not on file    Attends meetings of clubs or organizations: Not on file    Relationship status: Not on file  . Intimate partner violence:    Fear of current or ex partner: Not on file    Emotionally abused: Not on file    Physically abused: Not on file    Forced sexual activity: Not on file  Other Topics Concern  . Not on file  Social History Narrative  . Not on file    FAMILY HISTORY: Family History  Problem Relation Age of Onset  . Heart disease Father        d. 44  . Breast cancer Maternal Aunt        d. 46  . Breast cancer Cousin        dx 26  . Liver cancer Maternal Grandmother        d. 19  . Lung cancer Maternal  Grandfather        d. 66  . Breast cancer Cousin        dx 60s    Review of Systems  Constitutional: Negative for appetite change, fatigue, fever and unexpected weight change.  HENT:   Negative for hearing loss, lump/mass, sore throat and voice change.   Eyes: Negative for eye problems and icterus.  Respiratory: Negative for cough and shortness of breath.   Cardiovascular: Negative for chest pain, leg swelling and palpitations.  Gastrointestinal: Negative for abdominal distention, abdominal pain, constipation, diarrhea, nausea and vomiting.  Endocrine: Negative for hot flashes.  Genitourinary: Positive for menstrual problem (frequent  menses). Negative for difficulty urinating, dysuria, frequency, hematuria, pelvic pain and vaginal discharge.   Skin: Positive for rash (sensitivity on left labia). Negative for itching.  Neurological: Positive for headaches. Negative for dizziness, extremity weakness and numbness.  Hematological: Positive for adenopathy (swollen painful areas in axilla).  Psychiatric/Behavioral: Negative for depression. The patient is not nervous/anxious.       PHYSICAL EXAMINATION  ECOG PERFORMANCE STATUS: 1 - Symptomatic but completely ambulatory  Vitals:   05/14/18 0823  BP: (!) 100/46  Pulse: 74  Resp: 18  Temp: 98.1 F (36.7 C)  SpO2: 100%    Physical Exam  Constitutional: She is oriented to person, place, and time. She appears well-developed and well-nourished.  HENT:  Head: Normocephalic and atraumatic.  Mouth/Throat: Oropharynx is clear and moist. No oropharyngeal exudate.  Eyes: Pupils are equal, round, and reactive to light. No scleral icterus.  Neck: Neck supple.  Cardiovascular: Normal rate, regular rhythm and normal heart sounds.  Pulmonary/Chest: Effort normal and breath sounds normal.  Left breast with soft palpable mass at 12 oclock about 1cm in size  Left axilla 1cm and right axilla 1-2cm with nodules, unable to determine etiology   Abdominal: Soft. Bowel sounds are normal.  Genitourinary: Vagina normal. No vaginal discharge found.  Genitourinary Comments: Left inner labia with very small area of skin peeling/erosion, no definite herpetic lesion or area to culture or sample visualized  Musculoskeletal: She exhibits no edema.  Lymphadenopathy:    She has no cervical adenopathy.  Neurological: She is alert and oriented to person, place, and time.  Skin: Skin is warm and dry. Capillary refill takes less than 2 seconds.  Psychiatric: She has a normal mood and affect.    LABORATORY DATA:  CBC    Component Value Date/Time   WBC 8.7 05/14/2018 0803   WBC 5.3 03/03/2018 1114   RBC 3.37 (L) 05/14/2018 0803   HGB 9.6 (L) 05/14/2018 0803   HCT 28.6 (L) 05/14/2018 0803   PLT 226 05/14/2018 0803   MCV 84.9 05/14/2018 0803   MCH 28.5 05/14/2018 0803   MCHC 33.6 05/14/2018 0803   RDW 14.4 05/14/2018 0803   LYMPHSABS 1.4 05/14/2018 0803   MONOABS 1.0 05/14/2018 0803   EOSABS 0.0 05/14/2018 0803   BASOSABS 0.1 05/14/2018 0803    CMP     Component Value Date/Time   NA 142 05/14/2018 0803   K 3.9 05/14/2018 0803   CL 106 05/14/2018 0803   CO2 27 05/14/2018 0803   GLUCOSE 87 05/14/2018 0803   BUN 10 05/14/2018 0803   CREATININE 0.62 05/14/2018 0803   CREATININE 0.63 03/03/2018 1114   CALCIUM 9.2 05/14/2018 0803   PROT 6.5 05/14/2018 0803   ALBUMIN 3.7 05/14/2018 0803   AST 15 05/14/2018 0803   ALT 19 05/14/2018 0803   ALKPHOS 100 05/14/2018 0803   BILITOT <0.2 (L) 05/14/2018 0803   GFRNONAA >60 05/14/2018 0803   GFRAA >60 05/14/2018 0803        ASSESSMENT and THERAPY PLAN:   Malignant neoplasm of upper-outer quadrant of left breast in female, estrogen receptor negative ([place]) negative ([place]) 03/12/2018:Palpable masses in both breasts with family history of breast cancer, breast density category D, ultrasound measured these hypoechoic irregular mass left breast 2.3 cm, right breast no abnormality, biopsy  left breast mass: IDC grade 3 ER 0%, PR 0%, HER-2 negative, Ki-67 90%, lymph node biopsy benign: T2N0, stage IIb  Recommendationbased on multidisciplinary tumor board: Genetics consultation 1. Neoadjuvant chemotherapy with Adriamycin  and Cytoxan dose dense 4 followed byTaxol with carboplatinweekly 12 2. Followed by breast conserving surgery with sentinel lymph node study vs targeted axillary dissection 3. Followed by adjuvant radiation therapy  BRCA2 mutation: Patient contemplating on bilateral mastectomies with reconstruction Oophorectomy after the age of 55 ---------------------------------------------------------------------------------------------------------------- Current Treatment:Neoadjuvant chemotherapy with dose dense Adriamycin and Cytoxan.  Today is cycle 4 day 1  Chemo toxicities: 1.  Nausea and vomiting: Resolved 2. neutropenic fever: No longer a problem. 3. Axillary nodules: Examined also by Dr. Lindi Adie, he thinks they are sebaceous cysts, if still present at appt on 11/27 will get axillary ultrasounds/biopsy 4. Labial "rash": This is an area where the labia has a small amount of eroded skin.  There is nothing substantial enough for me to be able to culture with a viral culture.  I will get HSV 1 and 2 IGM and IGG levels to evaluate whether Valtrex should/could be given. 5. Headaches associated with frequent menses: Reviewed option of Goserelin.  Gave her info in [place], she will see how her menses go, and read the information. 6. Mucositis: nothing visualized on exam, however patient requests magic mouthwash and we will send this in for her   Return to clinic in 2 weeks to start Taxol/Carbo   Orders Placed This Encounter  Procedures  . HSV(herpes simplex vrs) 1+2 ab-IgG    Standing Status:   Future    Number of Occurrences:   1    Standing Expiration Date:   05/15/2019  . HSV(herpes simplex vrs) 1+2 ab-IgM    All questions were answered. The patient knows to  call the clinic with any problems, questions or concerns. We can certainly see the patient much sooner if necessary.  A total of (30) minutes of face-to-face time was spent with this patient with greater than 50% of that time in counseling and care-coordination.  This note was electronically signed. Scot Dock, NP 05/14/2018   Addendum: Attending Note  I personally saw and examined Samantha Mcneil. The plan of care was discussed with her. I agree with the assessment and plan as documented above. Axillary nodules: I suspect these are sebaceous cysts are her follicular cysts.  If they continue to be present when she comes back in 2 weeks and we may need to do an ultrasound-guided biopsy. Signed Harriette Ohara, MD

## 2018-05-14 NOTE — Patient Instructions (Signed)
Goserelin injection Qu es este medicamento? El GOSERELN es un medicamento similar a la hormona natural que se encuentra en el cuerpo. Este medicamento disminuye la cantidad de hormonas sexuales que produce el cuerpo. Los hombres tendrn niveles bajos de [place] y las mujeres tendrn niveles bajos de estrgeno mientras usan este medicamento. En los hombres, este medicamento se [place] para Lawyer de prstata, la inyeccin se administra una vez al mes o una vez cada 12 semanas. La inyeccin que se administra una vez al mes (solamente) se [place] para tratar a las mujeres con la endometriosis, sangrado uterino disfuncional o cncer de mama avanzada. Este medicamento puede ser utilizado para otros usos; si tiene alguna pregunta consulte con su proveedor de atencin mdica o con su farmacutico. MARCAS COMUNES: Zoladex Qu le debo informar a mi profesional de la salud antes de tomar este medicamento? Necesita saber si usted presenta alguno de los siguientes problemas o situaciones (algunos se aplican slo a mujeres): -diabetes -enfermedad cardiaca o ataque cardiaco previo -alta presin sangunea -colesterol alto -enfermedad renal -osteoporosis o baja densidad sea -problemas para orinar -lesin en la mdula espinal -derrame cerebral -fuma tabaco -una reaccin alrgica o inusual al gosereln, terapia hormonal, a otros medicamentos, alimentos, colorantes o conservantes -si est embarazada o buscando quedar embarazada -si est amamantando a un beb Cmo debo utilizar este medicamento? Este medicamento se administra mediante inyeccin por va subcutnea. Lo administra un profesional de Technical sales engineer en un hospital o en un entorno clnico. Los hombres reciben esta inyeccin cada 4 semanas o una vez cada 12 semanas. Las mujeres reciben slo las inyecciones que se administran cada 4 semanas. Hable con su pediatra para informarse acerca del uso de este medicamento en nios. Puede requerir  atencin especial. Sobredosis: Pngase en contacto inmediatamente con un centro toxicolgico o una sala de urgencia si usted cree que haya tomado demasiado medicamento. ATENCIN: [employer] es solo para usted. No comparta este medicamento con nadie. Qu sucede si me olvido de una dosis? Es importante no olvidar ninguna dosis. Informe a su mdico o a su profesional de la salud si no puede asistir a Photographer. Qu puede interactuar con este medicamento? -hormonas femeninas, como el estrgeno -suplementos a base de hierbas o dietticos, como cohosh negro, vitex o DHEA -hormonas masculinas, como la testosterona -prasterona Puede ser que esta lista no menciona todas las posibles interacciones. Informe a su profesional de [employer]	[place] de [employer] productos a base de hierbas, medicamentos de [place] o suplementos nutritivos que est tomando. Si usted fuma, consume bebidas alcohlicas o si utiliza drogas ilegales, indqueselo tambin a su profesional de [employer]	[place]. Algunas sustancias pueden interactuar con su medicamento. A qu debo estar atento al usar [employer]? Visite a su mdico o su profesional de la salud para chequear su evolucin peridicamente. Sus sntomas pueden aparecer a Engineer, manufacturing las primeras semanas de este medicamento. Si los sntomas no comienzan a mejorar o si empeoran despus de [employer], informe a su mdico o con su profesional de [employer]	[place]. El uso prolongado este medicamento puede disminuir la densidad sea. Si fuma tabaco o bebe alcohol con frecuencia puede aumentar su riesgo de prdida sea. Los antecedentes familiares de osteoporosis, el uso de medicamentos para convulsiones o de corticosteroides tambin pueden aumentar el riesgo de prdida sea. Consulte a su mdico para informarse sobre lo que puede hacer para ayudar a [employer]. Este medicamento debe detener los perodos menstruales regulares en mujeres. Si continua a  tener perodos  menstruales, informe a su mdico. Las mujeres no deben quedarse embarazadas mientras recibe este medicamento o durante 12 semanas despus de dejar de usar Coca[employer]Cola. Las mujeres deben informar a su mdico si estn buscando quedar embarazadas o si creen que estn embarazadas. Existe la posibilidad de efectos secundarios graves a un beb sin nacer. Para ms informacin hable con su profesional de la salud o su farmacutico. No debe amamantar a un beb mientras recibe este medicamento. Los hombres deben informar a su mdico si quieren tener nios. Este medicamento puede reducir el conteo de esperma. Para ms informacin hable con su profesional de la salud o su farmacutico. Qu efectos secundarios puedo tener al utilizar este medicamento? Efectos secundarios que debe informar a su mdico o a Barrister's clerk de la salud tan pronto como sea posible: -Chief of Staff como erupcin cutnea, picazn o urticarias, hinchazn de la cara, labios o lengua -dolor de huesos -problemas respiratorios -cambios en la visin -dolor en el pecho -sensacin de desmayos o mareos, cadas -fiebre, escalofros -dolor, hinchazn, sensacin de calor en la pierna -dolor, hormigueo, entumecimiento de las manos o pies -signos y sntomas de baja presin sangunea, tales como mareos; sensacin de Youth worker o aturdimiento, cadas; cansancio o debilidad inusual -dolor de estmago -hinchazn de los tobillos, pies, manos -dificultad para orinar o cambios en el volumen de orina -presin sangunea inusualmente alta o baja -cansancio o debilidad inusual Efectos secundarios que, por lo general, no requieren atencin mdica (debe informarlos a su mdico o a su profesional de la salud si persisten o si son molestos): -cambios en el deseo sexual o capacidad -cambios del tamao de los pechos tanto en hombres como mujeres -cambios de emociones o de humor -dolor de Pensions consultant -sofocos -irritacin en el lugar de la  inyeccin -prdida del apetito -problemas de la piel, acn -resequedad vaginal Puede ser que esta lista no menciona todos los posibles efectos secundarios. Comunquese a su mdico por asesoramiento mdico [employer] Usted puede informar los efectos secundarios a la FDA por telfono al 1-800-FDA-1088. Dnde debo guardar mi medicina? Este medicamento se administra en hospitales o clnicas y no necesitar guardarlo en su domicilio. ATENCIN: Este folleto es un resumen. Puede ser que no cubra toda la posible informacin. Si usted tiene preguntas acerca de esta medicina, consulte con su mdico, su farmacutico o su profesional de Technical sales engineer.  2018 Elsevier/Gold Standard (2014-08-09 00:00:00)

## 2018-05-16 ENCOUNTER — Inpatient Hospital Stay: Payer: BLUE CROSS/BLUE SHIELD

## 2018-05-16 VITALS — BP 114/63 | HR 81 | Temp 99.1°F | Resp 18

## 2018-05-16 DIAGNOSIS — C50412 Malignant neoplasm of upper-outer quadrant of left female breast: Secondary | ICD-10-CM

## 2018-05-16 DIAGNOSIS — Z171 Estrogen receptor negative status [ER-]: Principal | ICD-10-CM

## 2018-05-16 MED ORDER — PEGFILGRASTIM-CBQV 6 MG/0.6ML ~~LOC~~ SOSY
PREFILLED_SYRINGE | SUBCUTANEOUS | Status: AC
  Filled 2018-05-16: qty 0.6

## 2018-05-16 MED ORDER — PEGFILGRASTIM-CBQV 6 MG/0.6ML ~~LOC~~ SOSY
6.0000 mg | PREFILLED_SYRINGE | Freq: Once | SUBCUTANEOUS | Status: AC
  Administered 2018-05-16: 6 mg via SUBCUTANEOUS

## 2018-05-16 NOTE — Patient Instructions (Signed)
Pegfilgrastim injection What is this medicine? PEGFILGRASTIM (PEG fil gra stim) is a long-acting granulocyte colony-stimulating factor that stimulates the growth of neutrophils, a type of white blood cell important in the body's fight against infection. It is used to reduce the incidence of fever and infection in patients with certain types of cancer who are receiving chemotherapy that affects the bone marrow, and to increase survival after being exposed to high doses of radiation. This medicine may be used for other purposes; ask your health care provider or pharmacist if you have questions. COMMON BRAND NAME(S): Neulasta What should I tell my health care provider before I take this medicine? They need to know if you have any of these conditions: -kidney disease -latex allergy -ongoing radiation therapy -sickle cell disease -skin reactions to acrylic adhesives (On-Body Injector only) -an unusual or allergic reaction to pegfilgrastim, filgrastim, other medicines, foods, dyes, or preservatives -pregnant or trying to get pregnant -breast-feeding How should I use this medicine? This medicine is for injection under the skin. If you get this medicine at home, you will be taught how to prepare and give the pre-filled syringe or how to use the On-body Injector. Refer to the patient Instructions for Use for detailed instructions. Use exactly as directed. Tell your healthcare provider immediately if you suspect that the On-body Injector may not have performed as intended or if you suspect the use of the On-body Injector resulted in a missed or partial dose. It is important that you put your used needles and syringes in a special sharps container. Do not put them in a trash can. If you do not have a sharps container, call your pharmacist or healthcare provider to get one. Talk to your pediatrician regarding the use of this medicine in children. While this drug may be prescribed for selected conditions,  precautions do apply. Overdosage: If you think you have taken too much of this medicine contact a poison control center or emergency room at once. NOTE: This medicine is only for you. Do not share this medicine with others. What if I miss a dose? It is important not to miss your dose. Call your doctor or health care professional if you miss your dose. If you miss a dose due to an On-body Injector failure or leakage, a new dose should be administered as soon as possible using a single prefilled syringe for manual use. What may interact with this medicine? Interactions have not been studied. Give your health care provider a list of all the medicines, herbs, non-prescription drugs, or dietary supplements you use. Also tell them if you smoke, drink alcohol, or use illegal drugs. Some items may interact with your medicine. This list may not describe all possible interactions. Give your health care provider a list of all the medicines, herbs, non-prescription drugs, or dietary supplements you use. Also tell them if you smoke, drink alcohol, or use illegal drugs. Some items may interact with your medicine. What should I watch for while using this medicine? You may need blood work done while you are taking this medicine. If you are going to need a MRI, CT scan, or other procedure, tell your doctor that you are using this medicine (On-Body Injector only). What side effects may I notice from receiving this medicine? Side effects that you should report to your doctor or health care professional as soon as possible: -allergic reactions like skin rash, itching or hives, swelling of the face, lips, or tongue -dizziness -fever -pain, redness, or irritation at site   where injected -pinpoint red spots on the skin -red or dark-brown urine -shortness of breath or breathing problems -stomach or side pain, or pain at the shoulder -swelling -tiredness -trouble passing urine or change in the amount of urine Side  effects that usually do not require medical attention (report to your doctor or health care professional if they continue or are bothersome): -bone pain -muscle pain This list may not describe all possible side effects. Call your doctor for medical advice about side effects. You may report side effects to FDA at 1-800-FDA-1088. Where should I keep my medicine? Keep out of the reach of children. Store pre-filled syringes in a refrigerator between 2 and 8 degrees C (36 and 46 degrees F). Do not freeze. Keep in carton to protect from light. Throw away this medicine if it is left out of the refrigerator for more than 48 hours. Throw away any unused medicine after the expiration date. NOTE: This sheet is a summary. It may not cover all possible information. If you have questions about this medicine, talk to your doctor, pharmacist, or health care provider.  2018 Elsevier/Gold Standard (2016-06-13 12:58:03)  

## 2018-05-19 MED FILL — VENLAFAXINE HCL ER 37.5 MG: 37.5 | 30 days supply | Qty: 30 | Fill #2

## 2018-05-27 ENCOUNTER — Inpatient Hospital Stay (HOSPITAL_BASED_OUTPATIENT_CLINIC_OR_DEPARTMENT_OTHER): Payer: BLUE CROSS/BLUE SHIELD | Admitting: Adult Health

## 2018-05-27 ENCOUNTER — Telehealth: Payer: Self-pay | Admitting: Adult Health

## 2018-05-27 ENCOUNTER — Inpatient Hospital Stay: Payer: BLUE CROSS/BLUE SHIELD

## 2018-05-27 VITALS — BP 106/70 | HR 88 | Temp 97.6°F | Resp 18

## 2018-05-27 VITALS — BP 125/69 | HR 91 | Temp 98.5°F | Resp 18 | Ht 64.0 in | Wt 142.9 lb

## 2018-05-27 DIAGNOSIS — Z171 Estrogen receptor negative status [ER-]: Secondary | ICD-10-CM

## 2018-05-27 DIAGNOSIS — C50412 Malignant neoplasm of upper-outer quadrant of left female breast: Secondary | ICD-10-CM

## 2018-05-27 DIAGNOSIS — F419 Anxiety disorder, unspecified: Secondary | ICD-10-CM | POA: Diagnosis not present

## 2018-05-27 DIAGNOSIS — F329 Major depressive disorder, single episode, unspecified: Secondary | ICD-10-CM | POA: Diagnosis not present

## 2018-05-27 DIAGNOSIS — Z95828 Presence of other vascular implants and grafts: Secondary | ICD-10-CM

## 2018-05-27 DIAGNOSIS — Z1509 Genetic susceptibility to other malignant neoplasm: Secondary | ICD-10-CM

## 2018-05-27 LAB — CMP (CANCER CENTER ONLY)
ALT: 16 U/L (ref 0–44)
AST: 12 U/L — ABNORMAL LOW (ref 15–41)
Albumin: 3.8 g/dL (ref 3.5–5.0)
Alkaline Phosphatase: 111 U/L (ref 38–126)
Anion gap: 11 (ref 5–15)
BUN: 10 mg/dL (ref 6–20)
CO2: 24 mmol/L (ref 22–32)
Calcium: 9.1 mg/dL (ref 8.9–10.3)
Chloride: 106 mmol/L (ref 98–111)
Creatinine: 0.66 mg/dL (ref 0.44–1.00)
GFR, Est AFR Am: >60 mL/min (ref >60)
GFR, Estimated: >60 mL/min (ref >60)
Glucose, Bld: 133 mg/dL — ABNORMAL HIGH (ref 70–99)
Potassium: 3.7 mmol/L (ref 3.5–5.1)
Sodium: 141 mmol/L (ref 135–145)
Total Bilirubin: 0.2 mg/dL — ABNORMAL LOW (ref 0.3–1.2)
Total Protein: 6.8 g/dL (ref 6.5–8.1)

## 2018-05-27 LAB — CBC WITH DIFFERENTIAL (CANCER CENTER ONLY)
Abs Immature Granulocytes: 1.12 10*3/uL — ABNORMAL HIGH (ref 0.00–0.07)
Basophils Absolute: 0.1 10*3/uL (ref 0.0–0.1)
Basophils Relative: 1 %
Eosinophils Absolute: 0 10*3/uL (ref 0.0–0.5)
Eosinophils Relative: 0 %
HCT: 28.6 % — ABNORMAL LOW (ref 36.0–46.0)
Hemoglobin: 9.3 g/dL — ABNORMAL LOW (ref 12.0–15.0)
Immature Granulocytes: 12 %
Lymphocytes Relative: 12 %
Lymphs Abs: 1.2 10*3/uL (ref 0.7–4.0)
MCH: 28.5 pg (ref 26.0–34.0)
MCHC: 32.5 g/dL (ref 30.0–36.0)
MCV: 87.7 fL (ref 80.0–100.0)
Monocytes Absolute: 0.8 10*3/uL (ref 0.1–1.0)
Monocytes Relative: 9 %
Neutro Abs: 6.3 10*3/uL (ref 1.7–7.7)
Neutrophils Relative %: 66 %
Platelet Count: 161 10*3/uL (ref 150–400)
RBC: 3.26 MIL/uL — ABNORMAL LOW (ref 3.87–5.11)
RDW: 15.9 % — ABNORMAL HIGH (ref 11.5–15.5)
WBC Count: 9.5 10*3/uL (ref 4.0–10.5)
nRBC: 0.7 % — ABNORMAL HIGH (ref 0.0–0.2)

## 2018-05-27 MED ORDER — PALONOSETRON HCL INJECTION 0.25 MG/5ML
0.2500 mg | Freq: Once | INTRAVENOUS | Status: AC
  Administered 2018-05-27: 0.25 mg via INTRAVENOUS

## 2018-05-27 MED ORDER — FAMOTIDINE IN NACL 20-0.9 MG/50ML-% IV SOLN
20.0000 mg | Freq: Once | INTRAVENOUS | Status: AC
  Administered 2018-05-27: 20 mg via INTRAVENOUS

## 2018-05-27 MED ORDER — FAMOTIDINE IN NACL 20-0.9 MG/50ML-% IV SOLN
INTRAVENOUS | Status: AC
  Filled 2018-05-27: qty 50

## 2018-05-27 MED ORDER — DIPHENHYDRAMINE HCL 50 MG/ML IJ SOLN
50.0000 mg | Freq: Once | INTRAMUSCULAR | Status: AC
  Administered 2018-05-27: 50 mg via INTRAVENOUS

## 2018-05-27 MED ORDER — TRIAMCINOLONE ACETONIDE 0.025 % EX OINT: 1.0000 "application " | TOPICAL_OINTMENT | Freq: Two times a day (BID) | CUTANEOUS | 0 refills | Status: DC

## 2018-05-27 MED ORDER — SODIUM CHLORIDE 0.9 % IV SOLN
700.0000 mg | Freq: Once | INTRAVENOUS | Status: AC
  Administered 2018-05-27: 700 mg via INTRAVENOUS
  Filled 2018-05-27: qty 70

## 2018-05-27 MED ORDER — PALONOSETRON HCL INJECTION 0.25 MG/5ML
INTRAVENOUS | Status: AC
  Filled 2018-05-27: qty 5

## 2018-05-27 MED ORDER — HEPARIN SOD (PORK) LOCK FLUSH 100 UNIT/ML IV SOLN
500.0000 [IU] | Freq: Once | INTRAVENOUS | Status: AC | PRN
  Administered 2018-05-27: 500 [IU]
  Filled 2018-05-27: qty 5

## 2018-05-27 MED ORDER — SODIUM CHLORIDE 0.9% FLUSH
10.0000 mL | INTRAVENOUS | Status: DC | PRN
  Administered 2018-05-27: 10 mL
  Filled 2018-05-27: qty 10

## 2018-05-27 MED ORDER — SODIUM CHLORIDE 0.9% FLUSH
10.0000 mL | Freq: Once | INTRAVENOUS | Status: AC
  Administered 2018-05-27: 10 mL
  Filled 2018-05-27: qty 10

## 2018-05-27 MED ORDER — SODIUM CHLORIDE 0.9 % IV SOLN
Freq: Once | INTRAVENOUS | Status: AC
  Administered 2018-05-27: 12:00:00 via INTRAVENOUS
  Filled 2018-05-27: qty 5

## 2018-05-27 MED ORDER — ONDANSETRON HCL 8 MG PO TABS: 8.0000 mg | ORAL_TABLET | Freq: Two times a day (BID) | ORAL | 1 refills | Status: DC | PRN

## 2018-05-27 MED ORDER — SODIUM CHLORIDE 0.9 % IV SOLN
Freq: Once | INTRAVENOUS | Status: AC
  Administered 2018-05-27: 11:00:00 via INTRAVENOUS
  Filled 2018-05-27: qty 250

## 2018-05-27 MED ORDER — SODIUM CHLORIDE 0.9 % IV SOLN
80.0000 mg/m2 | Freq: Once | INTRAVENOUS | Status: AC
  Administered 2018-05-27: 132 mg via INTRAVENOUS
  Filled 2018-05-27: qty 22

## 2018-05-27 MED ORDER — DIPHENHYDRAMINE HCL 50 MG/ML IJ SOLN
INTRAMUSCULAR | Status: AC
  Filled 2018-05-27: qty 1

## 2018-05-27 MED FILL — ONDANSETRON HCL 8 MG TABLET: 8 | 15 days supply | Qty: 30 | Fill #0

## 2018-05-27 MED FILL — TRIAMCINOLONE 0.025% OINT: 0.025 | 10 days supply | Qty: 30 | Fill #0

## 2018-05-27 NOTE — Progress Notes (Signed)
[facility] Follow up:    Patient, [name] No address on file   DIAGNOSIS: Cancer Staging Malignant neoplasm of upper-outer quadrant of left breast in female, estrogen receptor negative ([place]) Staging form: Breast, AJCC 8th Edition - Clinical: Stage IIB (cT2, cN0, cM0, G3, ER-, PR-, HER2-) - Signed by Nicholas Lose, MD on 03/18/2018   SUMMARY OF ONCOLOGIC HISTORY:   Malignant neoplasm of upper-outer quadrant of left breast in female, estrogen receptor negative ([place])   03/12/2018 Initial Diagnosis    Palpable masses in both breasts with family history of breast cancer, breast density category D, ultrasound measured these hypoechoic irregular mass left breast 2.3 cm, right breast no abnormality, biopsy left breast mass: IDC grade 3 ER 0%, PR 0%, HER-2 negative, Ki-67 90%, lymph node biopsy benign: T2N0    03/18/2018 Cancer Staging    Staging form: Breast, AJCC 8th Edition - Clinical: Stage IIB (cT2, cN0, cM0, G3, ER-, PR-, HER2-) - Signed by Nicholas Lose, MD on 03/18/2018    03/30/2018 Breast MRI    Single biopsy-proven malignancy in the left 12 o'clock breast measures 2.3 x 1.3 x 1.5 cm.  Right breast without any malignancy detected.    04/02/2018 -  Neo-Adjuvant Chemotherapy    Neoadjuvant chemotherapy with dose dense Adriamycin and Cytoxan followed by Taxol and carboplatin    04/23/2018 Genetic Testing    Positive genetic testing: A pathogenic variant was identified in BRCA2 called [id number] (p.Ala2951Glyfs*26) on the Common Hereditary Cancers Panel. The Common Hereditary Cancers Panel offered by [facility] includes sequencing and/or deletion duplication testing of the following 47 genes: APC, ATM, AXIN2, BARD1, BMPR1A, BRCA1, BRCA2, BRIP1, CDH1, CDKN2A (p14ARF), CDKN2A (p16INK4a), CKD4, CHEK2, CTNNA1, DICER1, EPCAM (Deletion/duplication testing only), GREM1 (promoter region deletion/duplication testing only), KIT, MEN1, MLH1, MSH2, MSH3, MSH6, MUTYH, NBN, NF1,  NHTL1, PALB2, PDGFRA, PMS2, POLD1, POLE, PTEN, RAD50, RAD51C, RAD51D, SDHB, SDHC, SDHD, SMAD4, SMARCA4. STK11, TP53, TSC1, TSC2, and VHL.  The following genes were evaluated for sequence changes only: SDHA and HOXB13 c.251G>A variant only. Genetic testing did detect a Variant of Unknown Significance (VUS) in the PALB2 gene called c.205C>T.  At this time, it is unknown if this variant is associated with increased cancer risk or if this is a normal finding, but most variants such as this get reclassified to being inconsequential. It should not be used to make medical management decisions.   The report date is 04/23/2018.      CURRENT THERAPY: Taxol/Carbo  INTERVAL HISTORY: Samantha Mcneil 32 y.o. female returns for evaluation prior to receiving Taxol/Carbo.  She is doing well.  Her mouth sores have improved.  She needs a refill on Ondansetron.  She has skin breakdown on her labia.  She is requesting a cream to help.  She denies any ulcerations or worsening in the area since I examined it.  She denies any vaginal discharge.     Patient Active Problem List   Diagnosis Date Noted  . BRCA2 gene mutation positive 04/28/2018  . Genetic testing 04/24/2018  . Family history of breast cancer   . Family history of lung cancer   . Family history of liver cancer   . Port-A-Cath in place 04/09/2018  . Malignant neoplasm of upper-outer quadrant of left breast in female, estrogen receptor negative ([place]) 03/18/2018    has No Known Allergies.  MEDICAL HISTORY: Past Medical History:  Diagnosis Date  . Anxiety   . Depression   . Family history of breast cancer   .  Family history of liver cancer   . Family history of lung cancer   . Headache    migraines  . History of kidney stones     SURGICAL HISTORY: Past Surgical History:  Procedure Laterality Date  . CESAREAN SECTION    . OTHER SURGICAL HISTORY    . PORTACATH PLACEMENT Left 04/01/2018   Procedure: INSERTION PORT-A-CATH;  Surgeon: [name], MD;  Location: [facility];  Service: General;  Laterality: Left;    SOCIAL HISTORY: Social History   Socioeconomic History  . Marital status: Married    Spouse name: Not on file  . Number of children: Not on file  . Years of education: Not on file  . Highest education level: Not on file  Occupational History  . Not on file  Social Needs  . Financial resource strain: Not on file  . Food insecurity:    Worry: Not on file    Inability: Not on file  . Transportation needs:    Medical: Not on file    Non-medical: Not on file  Tobacco Use  . Smoking status: Never Smoker  . Smokeless tobacco: Never Used  Substance and Sexual Activity  . Alcohol use: Yes    Comment: Rare  . Drug use: Never  . Sexual activity: Yes    Birth control/protection: None    Comment: 1st intercourse 32 yo-Fewer than 5 partners  Lifestyle  . Physical activity:    Days per week: Not on file    Minutes per session: Not on file  . Stress: Not on file  Relationships  . Social connections:    Talks on phone: Not on file    Gets together: Not on file    Attends religious service: Not on file    Active member of club or organization: Not on file    Attends meetings of clubs or organizations: Not on file    Relationship status: Not on file  . Intimate partner violence:    Fear of current or ex partner: Not on file    Emotionally abused: Not on file    Physically abused: Not on file    Forced sexual activity: Not on file  Other Topics Concern  . Not on file  Social History Narrative  . Not on file    FAMILY HISTORY: Family History  Problem Relation Age of Onset  . Heart disease Father        d. 33  . Breast cancer Maternal Aunt        d. 54  . Breast cancer Cousin        dx 40  . Liver cancer Maternal Grandmother        d. 72  . Lung cancer Maternal Grandfather        d. 64  . Breast cancer Cousin        dx 60s    Review of Systems  Constitutional: Negative for  appetite change, chills, fatigue, fever and unexpected weight change.  HENT:   Negative for hearing loss, lump/mass and trouble swallowing.   Eyes: Negative for eye problems and icterus.  Respiratory: Negative for chest tightness, cough and shortness of breath.   Cardiovascular: Negative for chest pain, leg swelling and palpitations.  Gastrointestinal: Negative for abdominal distention, abdominal pain, constipation, diarrhea, nausea and vomiting.  Endocrine: Negative for hot flashes.  Skin: Negative for itching and rash.  Neurological: Negative for dizziness, extremity weakness, headaches and numbness.  Hematological: Negative for adenopathy.  Does not bruise/bleed easily.  Psychiatric/Behavioral: Negative for depression. The patient is not nervous/anxious.       PHYSICAL EXAMINATION  ECOG PERFORMANCE STATUS: 1 - Symptomatic but completely ambulatory  Vitals:   05/27/18 1035  BP: 125/69  Pulse: 91  Resp: 18  Temp: 98.5 F (36.9 C)  SpO2: 100%    Physical Exam  Constitutional: She is oriented to person, place, and time. She appears well-developed and well-nourished.  HENT:  Head: Normocephalic and atraumatic.  Mouth/Throat: Oropharynx is clear and moist. No oropharyngeal exudate.  Eyes: Pupils are equal, round, and reactive to light. No scleral icterus.  Neck: Neck supple.  Cardiovascular: Normal rate, regular rhythm, normal heart sounds and intact distal pulses.  Pulmonary/Chest: Effort normal and breath sounds normal.  Abdominal: Soft. Bowel sounds are normal.  Musculoskeletal: She exhibits no edema.  Lymphadenopathy:    She has no cervical adenopathy.  Neurological: She is alert and oriented to person, place, and time.  Skin: Skin is warm and dry. Capillary refill takes less than 2 seconds.  Psychiatric: She has a normal mood and affect.    LABORATORY DATA:  CBC    Component Value Date/Time   WBC 9.5 05/27/2018 1006   WBC 5.3 03/03/2018 1114   RBC 3.26 (L)  05/27/2018 1006   HGB 9.3 (L) 05/27/2018 1006   HCT 28.6 (L) 05/27/2018 1006   PLT 161 05/27/2018 1006   MCV 87.7 05/27/2018 1006   MCH 28.5 05/27/2018 1006   MCHC 32.5 05/27/2018 1006   RDW 15.9 (H) 05/27/2018 1006   LYMPHSABS 1.2 05/27/2018 1006   MONOABS 0.8 05/27/2018 1006   EOSABS 0.0 05/27/2018 1006   BASOSABS 0.1 05/27/2018 1006    CMP     Component Value Date/Time   NA 141 05/27/2018 1006   K 3.7 05/27/2018 1006   CL 106 05/27/2018 1006   CO2 24 05/27/2018 1006   GLUCOSE 133 (H) 05/27/2018 1006   BUN 10 05/27/2018 1006   CREATININE 0.66 05/27/2018 1006   CREATININE 0.63 03/03/2018 1114   CALCIUM 9.1 05/27/2018 1006   PROT 6.8 05/27/2018 1006   ALBUMIN 3.8 05/27/2018 1006   AST 12 (L) 05/27/2018 1006   ALT 16 05/27/2018 1006   ALKPHOS 111 05/27/2018 1006   BILITOT 0.2 (L) 05/27/2018 1006   GFRNONAA >60 05/27/2018 1006   GFRAA >60 05/27/2018 1006           ASSESSMENT and PLAN:   Malignant neoplasm of upper-outer quadrant of left breast in female, estrogen receptor negative ([place]) negative ([place]) 03/12/2018:Palpable masses in both breasts with family history of breast cancer, breast density category D, ultrasound measured these hypoechoic irregular mass left breast 2.3 cm, right breast no abnormality, biopsy left breast mass: IDC grade 3 ER 0%, PR 0%, HER-2 negative, Ki-67 90%, lymph node biopsy benign: T2N0, stage IIb  Recommendationbased on multidisciplinary tumor board: Genetics consultation 1. Neoadjuvant chemotherapy with Adriamycin and Cytoxan dose dense 4 followed byTaxol with carboplatinweekly 12 2. Followed by breast conserving surgery with sentinel lymph node study vs targeted axillary dissection 3. Followed by adjuvant radiation therapy  BRCA2 mutation: Patient contemplating on bilateral mastectomies with reconstruction Oophorectomy after the age of  32 ---------------------------------------------------------------------------------------------------------------- Current Treatment:Neoadjuvant chemotherapy with  Taxol/Carbo  Sent in triamcinolone ointment for patient and refilled her anti emetics.  We reviewed her upcoming treatment plan in detail, along with common adverse effects of the Taxol/Carbo.  We reviewed her anti emetics in detail.  Patient was not accompanied by  an interpreter today.  She notes that she understands everything, declined an interpreter for any clarification, questions, etc.  She will return next week for labs, f/u, and her next treatment.   All questions were answered. The patient knows to call the clinic with any problems, questions or concerns. We can certainly see the patient much sooner if necessary.  A total of (30) minutes of face-to-face time was spent with this patient with greater than 50% of that time in counseling and care-coordination.  This note was electronically signed. Scot Dock, NP 05/28/2018

## 2018-05-27 NOTE — Telephone Encounter (Signed)
No 11/27 los.

## 2018-05-27 NOTE — Patient Instructions (Addendum)
[place] Discharge Instructions for Patients Receiving Chemotherapy  Today you received the following chemotherapy agents Paclitaxel (TAXOL) & Carboplatin (PARAPLATIN).  To help prevent nausea and vomiting after your treatment, we encourage you to take your nausea medication as prescribed.   If you develop nausea and vomiting that is not controlled by your nausea medication, call the clinic.   BELOW ARE SYMPTOMS THAT SHOULD BE REPORTED IMMEDIATELY:  *FEVER GREATER THAN 100.5 F  *CHILLS WITH OR WITHOUT FEVER  NAUSEA AND VOMITING THAT IS NOT CONTROLLED WITH YOUR NAUSEA MEDICATION  *UNUSUAL SHORTNESS OF BREATH  *UNUSUAL BRUISING OR BLEEDING  TENDERNESS IN MOUTH AND THROAT WITH OR WITHOUT PRESENCE OF ULCERS  *URINARY PROBLEMS  *BOWEL PROBLEMS  UNUSUAL RASH Items with * indicate a potential emergency and should be followed up as soon as possible.  Feel free to call the clinic should you have any questions or concerns. The clinic phone number is [phone].  Please show the [place] at check-in to the Emergency Department and triage nurse.  Paclitaxel injection Qu es este medicamento? El PACLITAXEL es un agente quimioteraputico. Este medicamento acta sobre las clulas que se dividen rpidamente, como las clulas cancerosas, y finalmente provoca la muerte de estas clulas. Se utiliza en el tratamiento del cncer de [place], mama y otros tipos de cncer. Este medicamento puede ser utilizado para otros usos; si tiene alguna pregunta consulte con su proveedor de atencin mdica o con su farmacutico. MARCAS COMUNES: Onxol, Taxol Qu le debo informar a mi profesional de la salud antes de tomar este medicamento? Necesita saber si usted presenta alguno de los siguientes problemas o situaciones: -trastornos sanguneos -pulso cardiaco irregular -infeccin (especialmente infecciones virales, como varicela o herpes) -enfermedad heptica -radioterapia  reciente o continua -una reaccin alrgica o inusual al paclitaxel, alcohol, aceite de ricino polioxietilado, otros agentes quimioteraputicos, medicamentos, alimentos, colorantes o conservantes -si est embarazada o buscando quedar embarazada -si est amamantando a un beb Cmo debo utilizar este medicamento? Este medicamento se administra como infusin en una vena. Un profesional de la salud especialmente capacitado lo administra en un hospital o clnica. Hable con su pediatra para informarse acerca del uso de este medicamento en nios. Puede requerir atencin especial. Sobredosis: Pngase en contacto inmediatamente con un centro toxicolgico o una sala de urgencia si usted cree que haya tomado demasiado medicamento. ATENCIN: [employer] es solo para usted. No comparta este medicamento con nadie. Qu sucede si me olvido de una dosis? Es importante no olvidar ninguna dosis. Informe a su mdico o a su profesional de la salud si no puede asistir a Photographer. Qu puede interactuar con este medicamento? No tome esta medicina con ninguno de los siguientes medicamentos: -disulfiram -metronidazol Esta medicina tambin puede interactuar con los siguientes medicamentos: -ciclosporina -diazepam -quetoconazol -medicamentos para incrementar los conteos sanguneos, tales como filgrastim, pegfilgrastim, sargramostim -otros agentes quimioteraputicos, tales como cisplatino, doxorrubicina, epirubicina, etopsido, teniposida, vincristina -quinidina -testosterona -vacunas -verapamilo Consulte a su mdico o a su profesional de la salud antes de tomar cualquiera de los siguientes medicamentos: -acetaminofeno -aspirina -ibuprofeno -quetoprofeno -naproxeno Puede ser que esta lista no menciona todas las posibles interacciones. Informe a su profesional de [employer]	[place] de [employer] productos a base de hierbas, medicamentos de [place] o suplementos nutritivos que est tomando. Si usted fuma, consume  bebidas alcohlicas o si utiliza drogas ilegales, indqueselo tambin a su profesional de [employer]	[place]. Algunas sustancias pueden interactuar con su medicamento. A qu debo estar atento al usar  este medicamento? Se supervisar su estado de salud atentamente mientras reciba este medicamento. Tendr que hacerse anlisis de sangre importantes mientras est tomando este medicamento. Este medicamento puede causar Chief of Staff graves. Para reducir su riesgo, necesitar tomar otro(s) medicamento(s) antes del tratamiento con este medicamento. Si tiene [employer] erupcin cutnea, comezn/picazn o urticaria, hinchazn del rostro, los labios, o la [place], informe de inmediato a su mdico o profesional de Technical sales engineer. En algunos casos, podra recibir [employer] para ayudarlo con los efectos secundarios. Siga todas las instrucciones para usarlos. Este medicamento podra hacerle sentir un Nurse, mental health. Esto es normal ya que la quimioterapia afecta tanto a las clulas sanas como a las clulas cancerosas. Si presenta algn efecto secundario, infrmelo. Sin embargo, contine con el tratamiento aun si se siente enfermo, a menos que su mdico le indique que lo suspenda. Consulte a su mdico o a su profesional de la salud si tiene fiebre, escalofros o dolor de garganta, o cualquier otro sntoma de resfro o gripe. No se trate usted mismo. Este medicamento reduce la capacidad del cuerpo para combatir infecciones. Trate de no acercarse a personas que estn enfermas. Este medicamento podra aumentar el riesgo de moretones o sangrado. Consulte a su mdico o a su profesional de la salud si observa sangrados inusuales. Proceda con cuidado al cepillar sus dientes, usar hilo dental o Risk manager palillos para los dientes, ya que podra contraer una infeccin o Therapist, art con mayor facilidad. Si se somete a algn tratamiento dental, informe a su dentista que est [employer]. Evite tomar  productos que contienen aspirina, acetaminofeno, ibuprofeno, naproxeno o ketoprofeno, a menos que as lo indique su mdico. Estos productos pueden ocultar la fiebre. No debe quedar embarazada mientras reciba este medicamento. Las mujeres deben informar a su mdico si estn buscando quedar embarazadas o si creen que estn embarazadas. Existe la posibilidad de efectos secundarios graves en un beb sin nacer. Para obtener ms informacin, hable con su profesional de la salud o su farmacutico. No debe amamantar a un beb mientras est tomando este medicamento. Para los hombres, se desaconseja tener nios mientras reciben [employer]. Este producto podra contener alcohol. Pregunte a Midwife o a su proveedor de atencin de la salud si este medicamento contiene alcohol. Asegrese de decirles a todos los proveedores de atencin de la salud que usted est tomando [place]. Ciertos medicamentos, como metronidazol y disulfiram, pueden causar una reaccin desagradable cuando se toman con alcohol. Esta reaccin incluye enrojecimiento, dolor de cabeza, nuseas, vmitos, sudoracin y aumento de la sed. La reaccin puede durar de 30 minutos a varias horas. Qu efectos secundarios puedo tener al [employer] este medicamento? Efectos secundarios que debe informar a su mdico o a Barrister's clerk de la salud tan pronto como sea posible: Chief of Staff, como erupcin cutnea, picazn o urticarias, hinchazn de la cara, los labios o la lengua conteos sanguneos bajos - este frmaco puede reducir la cantidad de glbulos blancos, glbulos rojos y plaquetas. Su riesgo de infeccin y [place]. signos de infeccin - fiebre o escalofros, tos, dolor de garganta, Social research officer, government o dificultad para orinar signos de reduccin de plaquetas o sangrado - magulladuras, puntos rojos en la piel, heces de color oscuro o con aspecto alquitranado, sangrado por la nariz signos de reduccin de glbulos rojos - cansancio o  debilidad inusual, desmayos, sensacin de mareo problemas respiratorios dolor en el pecho alta o baja presin sangunea llagas en la boca nuseas y vmito dolor, hinchazn, enrojecimiento o  irritacin en el lugar de Air cabin crew, hormigueo, entumecimiento de las manos o los pies pulso cardiaco lento o irregular hinchazn de tobillos, pies, manos Efectos secundarios que, por lo general, no requieren atencin mdica (debe informarlos a su mdico o a su profesional de la salud si persisten o si son molestos): dolor de huesos cada total del cabello, incluyendo el cabello de la cabeza, de las [place], el vello pbico, las cejas y las pestaas cambios en el color de las uas de las manos diarrea aflojamiento de las uas de las manos prdida del apetito dolores musculares o articulares enrojecimiento de la piel sudoracin Puede ser que esta lista no menciona todos los posibles efectos secundarios. Comunquese a su mdico por asesoramiento mdico [employer] Usted puede informar los efectos secundarios a la FDA por telfono al 1-800-FDA-1088. Dnde debo guardar mi medicina? Este medicamento se administra en hospitales o clnicas y no necesitar guardarlo en su domicilio. ATENCIN: Este folleto es un resumen. Puede ser que no cubra toda la posible informacin. Si usted tiene preguntas acerca de esta medicina, consulte con su mdico, su farmacutico o su profesional de Technical sales engineer.  2018 Elsevier/Gold Standard (2016-07-18 00:00:00)  Carboplatin injection Qu es este medicamento? El CARBOPLATINO es un agente quimioteraputico. Este medicamento acta sobre las clulas que se dividen rpidamente, como las clulas cancergenas, y finalmente provoca la muerte de estas clulas. Se utiliza en el tratamiento del cncer de ovario y muchos otros tipos de Hotel manager. Este medicamento puede ser utilizado para otros usos; si tiene alguna pregunta consulte con su proveedor de atencin mdica o con su  farmacutico. MARCAS COMUNES: Paraplatin Qu le debo informar a mi profesional de la salud antes de tomar este medicamento? Necesita saber si usted presenta alguno de los siguientes problemas o situaciones: -trastornos sanguneos -problemas auditivos -enfermedad renal -radioterapia reciente o continuada -una reaccin alrgica o inusual al carboplatino, al cisplatino, a otros agentes quimioteraputicos, a otros medicamentos, alimentos, colorantes o conservantes -si est embarazada o buscando quedar embarazada -si est amamantando a un beb Cmo debo utilizar este medicamento? Este medicamento se administra normalmente mediante infusin por va intravenosa. Lo administra un profesional de la salud calificado en un hospital o en un entorno clnico. Hable con su pediatra para informarse acerca del uso de este medicamento en nios. Puede requerir atencin especial. Sobredosis: Pngase en contacto inmediatamente con un centro toxicolgico o una sala de urgencia si usted cree que haya tomado demasiado medicamento. ATENCIN: [employer] es solo para usted. No comparta este medicamento con nadie. Qu sucede si me olvido de una dosis? Es importante no olvidar ninguna dosis. Informe a su mdico o a su profesional de la salud si no puede asistir a Photographer. Qu puede interactuar con este medicamento? -medicamentos para convulsiones -medicamentos para incrementar los conteos sanguneos, tales como filgrastim, pegfilgrastim, sargramostim -ciertos antibiticos, tales como amicacina, gentamicina, neomicina, estreptomicina, tobramicina -vacunas Consulte a su mdico o a su profesional de la salud antes de tomar cualquiera de los siguientes medicamentos: -acetaminofeno -aspirina -ibuprofeno -quetoprofeno -naproxeno Puede ser que esta lista no menciona todas las posibles interacciones. Informe a su profesional de [employer]	[place] de [employer] productos a base de hierbas, medicamentos de [place] o  suplementos nutritivos que est tomando. Si usted fuma, consume bebidas alcohlicas o si utiliza drogas ilegales, indqueselo tambin a su profesional de [employer]	[place]. Algunas sustancias pueden interactuar con su medicamento. A qu debo estar atento al usar [employer]? Se supervisar su estado de [employer]  atentamente mientras reciba este medicamento. Tendr que hacerse anlisis de sangre peridicos mientras est tomando este medicamento. Este medicamento puede hacerle sentir un Nurse, mental health. Esto es normal ya que la quimioterapia afecta tanto a las clulas sanas como a las clulas cancerosas. Si presenta alguno de los [employer], infrmelos. Sin embargo, contine con el tratamiento aun si se siente enfermo, a menos que su mdico le indique que lo suspenda. En algunos casos, podr recibir [employer] para ayudarle con los efectos secundarios. Siga las instrucciones para usarlos. Consulte a su mdico o a su profesional de la salud por asesoramiento si tiene fiebre, escalofros, dolor de garganta o cualquier otro sntoma de resfro o gripe. No se trate usted mismo. Este medicamento puede reducir la capacidad del cuerpo para combatir infecciones. Trate de no acercarse a personas que estn enfermas. [employer] puede aumentar el riesgo de magulladuras o sangrado. Consulte a su mdico o a su profesional de la salud si observa sangrados inusuales. Proceda con cuidado al cepillar sus dientes, usar hilo dental o Risk manager palillos para los dientes, ya que puede contraer una infeccin o Therapist, art con mayor facilidad. Si se somete a algn tratamiento dental, informe a su dentista que est [employer]. Evite tomar productos que contienen aspirina, acetaminofeno, ibuprofeno, naproxeno o quetoprofeno a menos que as lo indique su mdico. Estos productos pueden disimular la fiebre. No se debe quedar embarazada mientras recibe este medicamento. Las mujeres deben informar a su mdico  si estn buscando quedar embarazadas o si creen que estn embarazadas. Existe la posibilidad de efectos secundarios graves a un beb sin nacer. Para ms informacin hable con su profesional de la salud o su farmacutico. No debe Economist a un beb mientras est usando este medicamento. Qu efectos secundarios puedo tener al [employer] este medicamento? Efectos secundarios que debe informar a su mdico o a Barrister's clerk de la salud tan pronto como sea posible: -Chief of Staff como erupcin cutnea, picazn o urticarias, hinchazn de la cara, labios o lengua -signos de infeccin - fiebre o escalofros, tos, dolor de garganta, dolor o dificultad para orinar -signos de reduccin de plaquetas o sangrado - magulladuras, puntos rojos en la piel, heces de color oscuro o con aspecto alquitranado, sangrando por la nariz -signos de reduccin de glbulos rojos - cansancio o debilidad inusual, desmayos, sensacin de [employer] -problemas respiratorios -cambios de audicin -cambios en la visin -dolor en el pecho -alta presin sangunea -conteos sanguneos bajos - Este medicamento puede reducir la cantidad de glbulos blancos, glbulos rojos y plaquetas. Su riesgo de infeccin y sangrado puede ser mayor. -nuseas, vmito -dolor, enrojecimiento, hinchazn o Actor de la inyeccin -dolor, hormigueo, entumecimiento de manos o pies -problemas de coordinacin, del habla, al caminar -dificultad para orinar o cambios en el volumen de orina Efectos secundarios que, por lo general, no requieren atencin mdica (debe informarlos a su mdico o a su profesional de la salud si persisten o si son molestos): -cada del cabello -prdida del apetito -sabor metlico o cambios en el sentido del gusto Puede ser que esta lista no menciona todos los posibles efectos secundarios. Comunquese a su mdico por asesoramiento mdico [employer] Usted puede informar los efectos secundarios a la FDA  por telfono al 1-800-FDA-1088. Dnde debo guardar mi medicina? Este medicamento se administra en hospitales o clnicas y no necesitar guardarlo en su domicilio. ATENCIN: Este folleto es un resumen. Puede ser que no cubra toda la posible informacin. Si usted tiene Engineer, drilling  de [employer], consulte con su mdico, su farmacutico o su profesional de [employer]	[place].  2018 Elsevier/Gold Standard (2014-08-09 00:00:00)

## 2018-05-27 NOTE — Assessment & Plan Note (Addendum)
negative ([place]) 03/12/2018:Palpable masses in both breasts with family history of breast cancer, breast density category D, ultrasound measured these hypoechoic irregular mass left breast 2.3 cm, right breast no abnormality, biopsy left breast mass: IDC grade 3 ER 0%, PR 0%, HER-2 negative, Ki-67 90%, lymph node biopsy benign: T2N0, stage IIb  Recommendationbased on multidisciplinary tumor board: Genetics consultation 1. Neoadjuvant chemotherapy with Adriamycin and Cytoxan dose dense 4 followed byTaxol with carboplatinweekly 12 2. Followed by breast conserving surgery with sentinel lymph node study vs targeted axillary dissection 3. Followed by adjuvant radiation therapy  BRCA2 mutation: Patient contemplating on bilateral mastectomies with reconstruction Oophorectomy after the age of 4 ---------------------------------------------------------------------------------------------------------------- Current Treatment:Neoadjuvant chemotherapy with Taxol/Carbo  Sent in triamcinolone ointment for patient and refilled her anti emetics.  We reviewed her upcoming treatment plan in detail, along with common adverse effects of the Taxol/Carbo.  We reviewed her anti emetics in detail.  Patient was not accompanied by an interpreter today.  She notes that she understands everything, declined an interpreter for any clarification, questions, etc.  She will return next week for labs, f/u, and her next treatment.

## 2018-05-28 ENCOUNTER — Encounter: Payer: Self-pay | Admitting: Adult Health

## 2018-05-28 LAB — HSV(HERPES SIMPLEX VRS) I + II AB-IGG
HSV 1 Glycoprotein G Ab, IgG: <0.91 index (ref 0.00–0.90)
HSV 2 Glycoprotein G Ab, IgG: <0.91 index (ref 0.00–0.90)

## 2018-05-29 ENCOUNTER — Inpatient Hospital Stay: Payer: BLUE CROSS/BLUE SHIELD

## 2018-05-29 ENCOUNTER — Telehealth: Payer: Self-pay | Admitting: *Deleted

## 2018-05-29 LAB — HSV(HERPES SIMPLEX VRS) I + II AB-IGM: HSVI/II Comb IgM: <0.91 Ratio (ref 0.00–0.90)

## 2018-05-29 MED ORDER — PEGFILGRASTIM-CBQV 6 MG/0.6ML ~~LOC~~ SOSY
PREFILLED_SYRINGE | SUBCUTANEOUS | Status: AC
  Filled 2018-05-29: qty 0.6

## 2018-05-29 NOTE — Telephone Encounter (Signed)
Called pt and discussed does not need to come in for injection today 11/29. Received verbal understanding.

## 2018-06-01 ENCOUNTER — Other Ambulatory Visit: Payer: Self-pay | Admitting: Hematology and Oncology

## 2018-06-01 NOTE — Progress Notes (Signed)
Patient developed a rash after first cycle of Taxol. We will discontinue Taxol and switched to Abraxane.

## 2018-06-03 ENCOUNTER — Other Ambulatory Visit: Payer: Self-pay

## 2018-06-03 DIAGNOSIS — C50412 Malignant neoplasm of upper-outer quadrant of left female breast: Secondary | ICD-10-CM

## 2018-06-03 DIAGNOSIS — Z171 Estrogen receptor negative status [ER-]: Principal | ICD-10-CM

## 2018-06-03 NOTE — Progress Notes (Signed)
Patient Care Team: Patient, [name] as PCP - General (General Practice)  DIAGNOSIS:    ICD-10-CM   1. Malignant neoplasm of upper-outer quadrant of left breast in female, estrogen receptor negative ([place]) C50.412 DISCONTINUED: ondansetron (ZOFRAN) 8 mg in sodium chloride 0.9 % 50 mL IVPB   Z17.1 DISCONTINUED: PACLitaxel-protein bound (ABRAXANE) chemo infusion 125 mg    SUMMARY OF ONCOLOGIC HISTORY:   Malignant neoplasm of upper-outer quadrant of left breast in female, estrogen receptor negative ([place])   03/12/2018 Initial Diagnosis    Palpable masses in both breasts with family history of breast cancer, breast density category D, ultrasound measured these hypoechoic irregular mass left breast 2.3 cm, right breast no abnormality, biopsy left breast mass: IDC grade 3 ER 0%, PR 0%, HER-2 negative, Ki-67 90%, lymph node biopsy benign: T2N0    03/18/2018 Cancer Staging    Staging form: Breast, AJCC 8th Edition - Clinical: Stage IIB (cT2, cN0, cM0, G3, ER-, PR-, HER2-) - Signed by Nicholas Lose, MD on 03/18/2018    03/30/2018 Breast MRI    Single biopsy-proven malignancy in the left 12 o'clock breast measures 2.3 x 1.3 x 1.5 cm.  Right breast without any malignancy detected.    04/02/2018 -  Neo-Adjuvant Chemotherapy    Neoadjuvant chemotherapy with dose dense Adriamycin and Cytoxan followed by Taxol and carboplatin    04/23/2018 Genetic Testing    Positive genetic testing: A pathogenic variant was identified in BRCA2 called [id number] (p.Ala2951Glyfs*26) on the Common Hereditary Cancers Panel. The Common Hereditary Cancers Panel offered by [facility] includes sequencing and/or deletion duplication testing of the following 47 genes: APC, ATM, AXIN2, BARD1, BMPR1A, BRCA1, BRCA2, BRIP1, CDH1, CDKN2A (p14ARF), CDKN2A (p16INK4a), CKD4, CHEK2, CTNNA1, DICER1, EPCAM (Deletion/duplication testing only), GREM1 (promoter region deletion/duplication testing only), KIT, MEN1, MLH1, MSH2, MSH3, MSH6, MUTYH,  NBN, NF1, NHTL1, PALB2, PDGFRA, PMS2, POLD1, POLE, PTEN, RAD50, RAD51C, RAD51D, SDHB, SDHC, SDHD, SMAD4, SMARCA4. STK11, TP53, TSC1, TSC2, and VHL.  The following genes were evaluated for sequence changes only: SDHA and HOXB13 c.251G>A variant only. Genetic testing did detect a Variant of Unknown Significance (VUS) in the PALB2 gene called c.205C>T.  At this time, it is unknown if this variant is associated with increased cancer risk or if this is a normal finding, but most variants such as this get reclassified to being inconsequential. It should not be used to make medical management decisions.   The report date is 04/23/2018.     06/04/2018 -  Chemotherapy    The patient had ondansetron (ZOFRAN) 8 mg in sodium chloride 0.9 % 50 mL IVPB, 8 mg (100 % of original dose 8 mg), Intravenous,  Once, 1 of 3 cycles Dose modification: 8 mg (original dose 8 mg, Cycle 1) PACLitaxel-protein bound (ABRAXANE) chemo infusion 125 mg, 80 mg/m2 = 125 mg (80 % of original dose 100 mg/m2), Intravenous, Once, 1 of 3 cycles Dose modification: 80 mg/m2 (original dose 100 mg/m2, Cycle 1, Reason: Provider Judgment)  for chemotherapy treatment.      CHIEF COMPLIANT: Follow-up for cycle 2 Abraxane  INTERVAL HISTORY: Samantha Mcneil is a 32 y.o. with above-mentioned history of left breast cancer currently neoadjuvant chemotherapy and she completed 4 cycles of dose dense Adriamycin and Cytoxan and is here today for Abraxane and today will be cycle 2 she had an allergic reaction to Taxol and therefore we switched her to Abraxane.  She also had profound nausea. She presents to the clinic with her parents and an interpreter was  used for the visit. She reports nausea for 4 days following her last treatment and she used medication which helped. She has a rash on her left arm from chemotherapy. She reports her mom had genetic testing and is also positive for BRCA2 mutation. She notes she is very tired, denies SOB, and sleeps a lot in  the days following chemotherapy. She reports she has not been eating much in recent weeks. Her CBC from today showed Hg 8.3, WBC 3.6, platelets 383.   REVIEW OF SYSTEMS:   Constitutional: Denies fevers, chills or abnormal weight loss (+) fatigue (+) loss of appetite Eyes: Denies blurriness of vision Ears, nose, mouth, throat, and face: Denies mucositis or sore throat Respiratory: Denies cough, dyspnea or wheezes Cardiovascular: Denies palpitation, chest discomfort Gastrointestinal:  Denies heartburn or change in bowel habits (+) nausea Skin: (+) rash on left arm Lymphatics: Denies new lymphadenopathy or easy bruising Neurological:Denies numbness, tingling or new weaknesses Behavioral/Psych: Mood is stable, no new changes  Extremities: No lower extremity edema Breast: denies any pain or lumps or nodules in either breasts All other systems were reviewed with the patient and are negative.  I have reviewed the past medical history, past surgical history, social history and family history with the patient and they are unchanged from previous note.  ALLERGIES:  has No Known Allergies.  MEDICATIONS:  Current Outpatient Medications  Medication Sig Dispense Refill  . ibuprofen (ADVIL,MOTRIN) 200 MG tablet Take 200 mg by mouth every 6 (six) hours as needed.    . magic mouthwash SOLN Take 5 mLs by mouth 4 (four) times daily as needed for mouth pain. 240 mL 0  . triamcinolone (KENALOG) 0.025 % ointment Apply 1 application topically 2 (two) times daily. 30 g 0  . venlafaxine XR (EFFEXOR-XR) 37.5 MG 24 hr capsule Take 1 capsule (37.5 mg total) by mouth daily with breakfast. 30 capsule 6   No current facility-administered medications for this visit.    Facility-Administered Medications Ordered in Other Visits  Medication Dose Route Frequency Provider Last Rate Last Dose  . 0.9 %  sodium chloride infusion   Intravenous Once Nicholas Lose, MD      . heparin lock flush 100 unit/mL  500 Units  Intracatheter Once PRN Nicholas Lose, MD      . ondansetron (ZOFRAN) 8 mg in sodium chloride 0.9 % 50 mL IVPB  8 mg Intravenous Once Nicholas Lose, MD      . PACLitaxel-protein bound (ABRAXANE) chemo infusion 125 mg  80 mg/m2 (Treatment Plan Recorded) Intravenous Once Nicholas Lose, MD      . sodium chloride flush (NS) 0.9 % injection 10 mL  10 mL Intracatheter PRN Nicholas Lose, MD        PHYSICAL EXAMINATION: ECOG PERFORMANCE STATUS: 1 - Symptomatic but completely ambulatory  Vitals:   06/04/18 1038  BP: 114/76  Pulse: 91  Resp: 17  Temp: 98.7 F (37.1 C)  SpO2: 100%   Filed Weights   06/04/18 1038  Weight: 142 lb 3.2 oz (64.5 kg)    GENERAL:alert, no distress and comfortable SKIN: skin color, texture, turgor are normal, no rashes or significant lesions EYES: normal, Conjunctiva are pink and non-injected, sclera clear OROPHARYNX:no exudate, no erythema and lips, buccal mucosa, and tongue normal  NECK: supple, thyroid normal size, non-tender, without nodularity LYMPH:  no palpable lymphadenopathy in the cervical, axillary or inguinal LUNGS: clear to auscultation and percussion with normal breathing effort HEART: regular rate & rhythm and no murmurs and no  lower extremity edema ABDOMEN:abdomen soft, non-tender and normal bowel sounds MUSCULOSKELETAL:no cyanosis of digits and no clubbing  NEURO: alert & oriented x 3 with fluent speech, no focal motor/sensory deficits EXTREMITIES: No lower extremity edema  LABORATORY DATA:  I have reviewed the data as listed CMP Latest Ref Rng & Units 06/04/2018 05/27/2018 05/14/2018  Glucose 70 - 99 mg/dL 101(H) 133(H) 87  BUN 6 - 20 mg/dL '10 10 10  ' Creatinine 0.44 - 1.00 mg/dL 0.63 0.66 0.62  Sodium 135 - 145 mmol/L 142 141 142  Potassium 3.5 - 5.1 mmol/L 3.6 3.7 3.9  Chloride 98 - 111 mmol/L 107 106 106  CO2 22 - 32 mmol/L '25 24 27  ' Calcium 8.9 - 10.3 mg/dL 9.2 9.1 9.2  Total Protein 6.5 - 8.1 g/dL 6.7 6.8 6.5  Total Bilirubin 0.3 -  1.2 mg/dL 0.2(L) 0.2(L) <0.2(L)  Alkaline Phos 38 - 126 U/L 82 111 100  AST 15 - 41 U/L 23 12(L) 15  ALT 0 - 44 U/L '28 16 19    ' Lab Results  Component Value Date   WBC 3.6 (L) 06/04/2018   HGB 8.3 (L) 06/04/2018   HCT 25.1 (L) 06/04/2018   MCV 86.6 06/04/2018   PLT 383 06/04/2018   NEUTROABS 2.2 06/04/2018    ASSESSMENT & PLAN:  Malignant neoplasm of upper-outer quadrant of left breast in female, estrogen receptor negative ([place]) 03/12/2018:Palpable masses in both breasts with family history of breast cancer, breast density category D, ultrasound measured these hypoechoic irregular mass left breast 2.3 cm, right breast no abnormality, biopsy left breast mass: IDC grade 3 ER 0%, PR 0%, HER-2 negative, Ki-67 90%, lymph node biopsy benign: T2N0, stage IIb  Recommendationbased on multidisciplinary tumor board: Genetics consultation 1. Neoadjuvant chemotherapy with Adriamycin and Cytoxan dose dense 4 followed byTaxol with carboplatinweekly 12 2. Followed by breast conserving surgery with sentinel lymph node study vs targeted axillary dissection 3. Followed by adjuvant radiation therapy  BRCA2 mutation: Patient contemplating on bilateral mastectomies with reconstruction Oophorectomy after the age of 78 ---------------------------------------------------------------------------------------------------------------- Current Treatment:Neoadjuvant chemotherapywith dose dense Adriamycin and Cytoxan x4 completed. Taxol started 05/20/2018, developed severe reaction, switching to Abraxane from cycle 2  Taxol reaction: Severe rash.  We discontinued Taxol and switch her to Abraxane. Nauseated her Taxol: I am adding Zofran to her IV treatment.  Return to clinic weekly for chemo and every 2 weeks for follow-up with me    No orders of the defined types were placed in this encounter.  The patient has a good understanding of the overall plan. she agrees with it. she will call with any  problems that may develop before the next visit here.  Nicholas Lose, MD 06/04/2018   I, Cloyde Reams Dorshimer, am acting as scribe for Nicholas Lose, MD.  I have reviewed the above documentation for accuracy and completeness, and I agree with the above.

## 2018-06-04 ENCOUNTER — Inpatient Hospital Stay: Payer: BLUE CROSS/BLUE SHIELD | Attending: Hematology and Oncology

## 2018-06-04 ENCOUNTER — Inpatient Hospital Stay: Payer: BLUE CROSS/BLUE SHIELD

## 2018-06-04 ENCOUNTER — Encounter: Payer: Self-pay | Admitting: *Deleted

## 2018-06-04 ENCOUNTER — Inpatient Hospital Stay (HOSPITAL_BASED_OUTPATIENT_CLINIC_OR_DEPARTMENT_OTHER): Payer: BLUE CROSS/BLUE SHIELD | Admitting: Hematology and Oncology

## 2018-06-04 DIAGNOSIS — Z1509 Genetic susceptibility to other malignant neoplasm: Secondary | ICD-10-CM | POA: Insufficient documentation

## 2018-06-04 DIAGNOSIS — D701 Agranulocytosis secondary to cancer chemotherapy: Secondary | ICD-10-CM | POA: Diagnosis not present

## 2018-06-04 DIAGNOSIS — C50412 Malignant neoplasm of upper-outer quadrant of left female breast: Secondary | ICD-10-CM

## 2018-06-04 DIAGNOSIS — Z171 Estrogen receptor negative status [ER-]: Secondary | ICD-10-CM | POA: Diagnosis not present

## 2018-06-04 DIAGNOSIS — R110 Nausea: Secondary | ICD-10-CM | POA: Diagnosis not present

## 2018-06-04 DIAGNOSIS — Z79899 Other long term (current) drug therapy: Secondary | ICD-10-CM | POA: Diagnosis not present

## 2018-06-04 DIAGNOSIS — Z5111 Encounter for antineoplastic chemotherapy: Secondary | ICD-10-CM | POA: Insufficient documentation

## 2018-06-04 DIAGNOSIS — R21 Rash and other nonspecific skin eruption: Secondary | ICD-10-CM | POA: Insufficient documentation

## 2018-06-04 DIAGNOSIS — Z95828 Presence of other vascular implants and grafts: Secondary | ICD-10-CM

## 2018-06-04 LAB — CBC WITH DIFFERENTIAL (CANCER CENTER ONLY)
Abs Immature Granulocytes: 0.04 10*3/uL (ref 0.00–0.07)
Basophils Absolute: 0.1 10*3/uL (ref 0.0–0.1)
Basophils Relative: 1 %
Eosinophils Absolute: 0 10*3/uL (ref 0.0–0.5)
Eosinophils Relative: 0 %
HCT: 25.1 % — ABNORMAL LOW (ref 36.0–46.0)
Hemoglobin: 8.3 g/dL — ABNORMAL LOW (ref 12.0–15.0)
Immature Granulocytes: 1 %
Lymphocytes Relative: 22 %
Lymphs Abs: 0.8 10*3/uL (ref 0.7–4.0)
MCH: 28.6 pg (ref 26.0–34.0)
MCHC: 33.1 g/dL (ref 30.0–36.0)
MCV: 86.6 fL (ref 80.0–100.0)
Monocytes Absolute: 0.5 10*3/uL (ref 0.1–1.0)
Monocytes Relative: 14 %
Neutro Abs: 2.2 10*3/uL (ref 1.7–7.7)
Neutrophils Relative %: 62 %
Platelet Count: 383 10*3/uL (ref 150–400)
RBC: 2.9 MIL/uL — ABNORMAL LOW (ref 3.87–5.11)
RDW: 15.5 % (ref 11.5–15.5)
WBC Count: 3.6 10*3/uL — ABNORMAL LOW (ref 4.0–10.5)
nRBC: 0 % (ref 0.0–0.2)

## 2018-06-04 LAB — CMP (CANCER CENTER ONLY)
ALT: 28 U/L (ref 0–44)
AST: 23 U/L (ref 15–41)
Albumin: 3.8 g/dL (ref 3.5–5.0)
Alkaline Phosphatase: 82 U/L (ref 38–126)
Anion gap: 10 (ref 5–15)
BUN: 10 mg/dL (ref 6–20)
CO2: 25 mmol/L (ref 22–32)
Calcium: 9.2 mg/dL (ref 8.9–10.3)
Chloride: 107 mmol/L (ref 98–111)
Creatinine: 0.63 mg/dL (ref 0.44–1.00)
GFR, Est AFR Am: >60 mL/min (ref >60)
GFR, Estimated: >60 mL/min (ref >60)
Glucose, Bld: 101 mg/dL — ABNORMAL HIGH (ref 70–99)
Potassium: 3.6 mmol/L (ref 3.5–5.1)
Sodium: 142 mmol/L (ref 135–145)
Total Bilirubin: 0.2 mg/dL — ABNORMAL LOW (ref 0.3–1.2)
Total Protein: 6.7 g/dL (ref 6.5–8.1)

## 2018-06-04 MED ORDER — SODIUM CHLORIDE 0.9% FLUSH
10.0000 mL | Freq: Once | INTRAVENOUS | Status: AC
  Administered 2018-06-04: 10 mL
  Filled 2018-06-04: qty 10

## 2018-06-04 MED ORDER — PACLITAXEL PROTEIN-BOUND CHEMO INJECTION 100 MG
80.0000 mg/m2 | Freq: Once | INTRAVENOUS | Status: AC
  Administered 2018-06-04: 125 mg via INTRAVENOUS
  Filled 2018-06-04: qty 25

## 2018-06-04 MED ORDER — SODIUM CHLORIDE 0.9 % IV SOLN
Freq: Once | INTRAVENOUS | Status: AC
  Administered 2018-06-04: 12:00:00 via INTRAVENOUS
  Filled 2018-06-04: qty 250

## 2018-06-04 MED ORDER — ONDANSETRON HCL 4 MG/2ML IJ SOLN
INTRAMUSCULAR | Status: AC
  Filled 2018-06-04: qty 4

## 2018-06-04 MED ORDER — ONDANSETRON HCL 4 MG/2ML IJ SOLN
8.0000 mg | Freq: Once | INTRAMUSCULAR | Status: AC
  Administered 2018-06-04: 8 mg via INTRAVENOUS

## 2018-06-04 MED ORDER — HEPARIN SOD (PORK) LOCK FLUSH 100 UNIT/ML IV SOLN
500.0000 [IU] | Freq: Once | INTRAVENOUS | Status: AC | PRN
  Administered 2018-06-04: 500 [IU]
  Filled 2018-06-04: qty 5

## 2018-06-04 MED ORDER — SODIUM CHLORIDE 0.9 % IV SOLN: 8.0000 mg | Freq: Once | INTRAVENOUS | Status: DC

## 2018-06-04 MED ORDER — SODIUM CHLORIDE 0.9% FLUSH
10.0000 mL | INTRAVENOUS | Status: DC | PRN
  Administered 2018-06-04: 10 mL
  Filled 2018-06-04: qty 10

## 2018-06-04 NOTE — Assessment & Plan Note (Signed)
03/12/2018:Palpable masses in both breasts with family history of breast cancer, breast density category D, ultrasound measured these hypoechoic irregular mass left breast 2.3 cm, right breast no abnormality, biopsy left breast mass: IDC grade 3 ER 0%, PR 0%, HER-2 negative, Ki-67 90%, lymph node biopsy benign: T2N0, stage IIb  Recommendationbased on multidisciplinary tumor board: Genetics consultation 1. Neoadjuvant chemotherapy with Adriamycin and Cytoxan dose dense 4 followed byTaxol with carboplatinweekly 12 2. Followed by breast conserving surgery with sentinel lymph node study vs targeted axillary dissection 3. Followed by adjuvant radiation therapy  BRCA2 mutation: Patient contemplating on bilateral mastectomies with reconstruction Oophorectomy after the age of 46 ---------------------------------------------------------------------------------------------------------------- Current Treatment:Neoadjuvant chemotherapywith dose dense Adriamycin and Cytoxan x4 completed. Taxol started 05/20/2018, developed severe reaction, switching to Abraxane from cycle 2  Taxol reaction: Severe rash.  We discontinued Taxol and switch her to Abraxane.  Return to clinic weekly for chemo and every 2 weeks for follow-up with me 

## 2018-06-05 ENCOUNTER — Telehealth: Payer: Self-pay | Admitting: Hematology and Oncology

## 2018-06-05 NOTE — Telephone Encounter (Signed)
Per 1/25 no los °

## 2018-06-06 ENCOUNTER — Ambulatory Visit: Payer: BLUE CROSS/BLUE SHIELD

## 2018-06-10 ENCOUNTER — Other Ambulatory Visit: Payer: Self-pay

## 2018-06-10 DIAGNOSIS — Z803 Family history of malignant neoplasm of breast: Secondary | ICD-10-CM

## 2018-06-11 ENCOUNTER — Inpatient Hospital Stay: Payer: BLUE CROSS/BLUE SHIELD

## 2018-06-11 VITALS — BP 121/76 | HR 88 | Temp 97.8°F | Resp 18

## 2018-06-11 DIAGNOSIS — C50412 Malignant neoplasm of upper-outer quadrant of left female breast: Secondary | ICD-10-CM | POA: Diagnosis not present

## 2018-06-11 DIAGNOSIS — Z171 Estrogen receptor negative status [ER-]: Secondary | ICD-10-CM

## 2018-06-11 DIAGNOSIS — Z803 Family history of malignant neoplasm of breast: Secondary | ICD-10-CM

## 2018-06-11 DIAGNOSIS — Z95828 Presence of other vascular implants and grafts: Secondary | ICD-10-CM

## 2018-06-11 LAB — CMP (CANCER CENTER ONLY)
ALT: 79 U/L — ABNORMAL HIGH (ref 0–44)
AST: 56 U/L — ABNORMAL HIGH (ref 15–41)
Albumin: 3.7 g/dL (ref 3.5–5.0)
Alkaline Phosphatase: 76 U/L (ref 38–126)
Anion gap: 10 (ref 5–15)
BUN: 9 mg/dL (ref 6–20)
CO2: 24 mmol/L (ref 22–32)
Calcium: 9.2 mg/dL (ref 8.9–10.3)
Chloride: 107 mmol/L (ref 98–111)
Creatinine: 0.59 mg/dL (ref 0.44–1.00)
GFR, Est AFR Am: >60 mL/min (ref >60)
GFR, Estimated: >60 mL/min (ref >60)
Glucose, Bld: 117 mg/dL — ABNORMAL HIGH (ref 70–99)
Potassium: 3.8 mmol/L (ref 3.5–5.1)
Sodium: 141 mmol/L (ref 135–145)
Total Bilirubin: 0.3 mg/dL (ref 0.3–1.2)
Total Protein: 6.4 g/dL — ABNORMAL LOW (ref 6.5–8.1)

## 2018-06-11 LAB — CBC WITH DIFFERENTIAL (CANCER CENTER ONLY)
Abs Immature Granulocytes: 0.01 10*3/uL (ref 0.00–0.07)
Basophils Absolute: 0 10*3/uL (ref 0.0–0.1)
Basophils Relative: 2 %
Eosinophils Absolute: 0 10*3/uL (ref 0.0–0.5)
Eosinophils Relative: 1 %
HCT: 23.6 % — ABNORMAL LOW (ref 36.0–46.0)
Hemoglobin: 7.9 g/dL — ABNORMAL LOW (ref 12.0–15.0)
Immature Granulocytes: 1 %
Lymphocytes Relative: 37 %
Lymphs Abs: 0.8 10*3/uL (ref 0.7–4.0)
MCH: 29.3 pg (ref 26.0–34.0)
MCHC: 33.5 g/dL (ref 30.0–36.0)
MCV: 87.4 fL (ref 80.0–100.0)
Monocytes Absolute: 0.3 10*3/uL (ref 0.1–1.0)
Monocytes Relative: 16 %
Neutro Abs: 1 10*3/uL — ABNORMAL LOW (ref 1.7–7.7)
Neutrophils Relative %: 43 %
Platelet Count: 202 10*3/uL (ref 150–400)
RBC: 2.7 MIL/uL — ABNORMAL LOW (ref 3.87–5.11)
RDW: 16.9 % — ABNORMAL HIGH (ref 11.5–15.5)
WBC Count: 2.2 10*3/uL — ABNORMAL LOW (ref 4.0–10.5)
nRBC: 0 % (ref 0.0–0.2)

## 2018-06-11 MED ORDER — SODIUM CHLORIDE 0.9% FLUSH
10.0000 mL | Freq: Once | INTRAVENOUS | Status: AC
  Administered 2018-06-11: 10 mL
  Filled 2018-06-11: qty 10

## 2018-06-11 MED ORDER — HEPARIN SOD (PORK) LOCK FLUSH 100 UNIT/ML IV SOLN
500.0000 [IU] | Freq: Once | INTRAVENOUS | Status: AC
  Administered 2018-06-11: 500 [IU]
  Filled 2018-06-11: qty 5

## 2018-06-11 NOTE — Patient Instructions (Signed)
Neutropenia Neutropenia is a condition that occurs when you have a lower-than-normal level of a type of white blood cell (neutrophil) in your body. Neutrophils are made in the spongy center of large bones (bone marrow) and they fight infections. Neutrophils are your body's main defense against bacterial and fungal infections. The fewer neutrophils you have and the longer your body remains without them, the greater your risk of getting a severe infection. What are the causes? This condition can occur if your body uses up or destroys neutrophils faster than your bone marrow can make them. This problem may happen because of:  Bacterial or fungal infection.  Allergic disorders.  Reactions to some medicines.  Autoimmune disease.  An enlarged spleen.  This condition can also occur if your bone marrow does not produce enough neutrophils. This problem may be caused by:  Cancer.  Cancer treatments, such as radiation or chemotherapy.  Viral infections.  Medicines, such as phenytoin.  Vitamin B12 deficiency.  Diseases of the bone marrow.  Environmental toxins, such as insecticides.  What are the signs or symptoms? This condition does not usually cause symptoms. If symptoms are present, they are usually caused by an underlying infection. Symptoms of an infection may include:  Fever.  Chills.  Swollen glands.  Oral or anal ulcers.  Cough and shortness of breath.  Rash.  Skin infection.  Fatigue.  How is this diagnosed? Your health care provider may suspect neutropenia if you have:  A condition that may cause neutropenia.  Symptoms of infection, especially fever.  Frequent and unusual infections.  You will have a medical history and physical exam. Tests will also be done, such as:  A complete blood count (CBC).  A procedure to collect a sample of bone marrow for examination (bone marrow biopsy).  A chest X-ray.  A urine culture.  A blood culture.  How is this  treated? Treatment depends on the underlying cause and severity of your condition. Mild neutropenia may not require treatment. Treatment may include medicines, such as:  Antibiotic medicine given through an IV tube.  Antiviral medicines.  Antifungal medicines.  A medicine to increase neutrophil production (colony-stimulating factor). You may get this drug through an IV tube or by injection.  Steroids given through an IV tube.  If an underlying condition is causing neutropenia, you may need treatment for that condition. If medicines you are taking are causing neutropenia, your health care provider may have you stop taking those medicines. Follow these instructions at home: Medicines  Take over-the-counter and prescription medicines only as told by your health care provider.  Get a seasonal flu shot (influenza vaccine). Lifestyle  Do not eat unpasteurized foods.Do not eat unwashed raw fruits or vegetables.  Avoid exposure to groups of people or children.  Avoid being around people who are sick.  Avoid being around dirt or dust, such as in construction areas or gardens.  Do not provide direct care for pets. Avoid animal droppings. Do not clean litter boxes and bird cages. Hygiene   Bathe daily.  Clean the area between the genitals and the anus (perineal area) after you urinate or have a bowel movement. If you are female, wipe from front to back.  Brush your teeth with a soft toothbrush before and after meals.  Do not use a razor that has a blade. Use an electric razor to remove hair.  Wash your hands often. Make sure others who come in contact with you also wash their hands. If soap and water  are not available, use hand sanitizer. General instructions  Do not have sex unless your health care provider has approved.  Take actions to avoid cuts and burns. For example: ? Be cautious when you use knives. Always cut away from yourself. ? Keep knives in protective sheaths or  guards when not in use. ? Use oven mitts when you cook with a hot stove, oven, or grill. ? Stand a safe distance away from open fires.  Avoid people who received a vaccine in the past 30 days if that vaccine contained a live version of the germ (live vaccine). You should not get a live vaccine. Common live vaccines are varicella, measles, mumps, and rubella.  Do not share food utensils.  Do not use tampons, enemas, or rectal suppositories unless your health care provider has approved.  Keep all appointments as told by your health care provider. This is important. Contact a health care provider if:  You have a fever.  You have chills or you start to shake.  You have: ? A sore throat. ? A warm, red, or tender area on your skin. ? A cough. ? Frequent or painful urination. ? Vaginal discharge or itching.  You develop: ? Sores in your mouth or anus. ? Swollen lymph nodes. ? Red streaks on the skin. ? A rash.  You feel: ? Nauseous or you vomit. ? Very fatigued. ? Short of breath. This information is not intended to replace advice given to you by your health care provider. Make sure you discuss any questions you have with your health care provider. Document Released: 12/07/2001 Document Revised: 11/23/2015 Document Reviewed: 12/28/2014 Elsevier Interactive Patient Education  2018 [employer]  

## 2018-06-11 NOTE — Progress Notes (Signed)
Per Dr. Gwenlyn Perking, hold abraxane treatment today due to [facility] being 1.0

## 2018-06-13 ENCOUNTER — Ambulatory Visit: Payer: BLUE CROSS/BLUE SHIELD

## 2018-06-17 ENCOUNTER — Other Ambulatory Visit: Payer: Self-pay

## 2018-06-17 DIAGNOSIS — Z171 Estrogen receptor negative status [ER-]: Principal | ICD-10-CM

## 2018-06-17 DIAGNOSIS — C50412 Malignant neoplasm of upper-outer quadrant of left female breast: Secondary | ICD-10-CM

## 2018-06-17 NOTE — Progress Notes (Signed)
Patient Care Team: Patient, [name] as PCP - General (General Practice)  DIAGNOSIS:    ICD-10-CM   1. Malignant neoplasm of upper-outer quadrant of left breast in female, estrogen receptor negative ([place]) C50.412    Z17.1     SUMMARY OF ONCOLOGIC HISTORY:   Malignant neoplasm of upper-outer quadrant of left breast in female, estrogen receptor negative ([place])   03/12/2018 Initial Diagnosis    Palpable masses in both breasts with family history of breast cancer, breast density category D, ultrasound measured these hypoechoic irregular mass left breast 2.3 cm, right breast no abnormality, biopsy left breast mass: IDC grade 3 ER 0%, PR 0%, HER-2 negative, Ki-67 90%, lymph node biopsy benign: T2N0    03/18/2018 Cancer Staging    Staging form: Breast, AJCC 8th Edition - Clinical: Stage IIB (cT2, cN0, cM0, G3, ER-, PR-, HER2-) - Signed by Nicholas Lose, MD on 03/18/2018    03/30/2018 Breast MRI    Single biopsy-proven malignancy in the left 12 o'clock breast measures 2.3 x 1.3 x 1.5 cm.  Right breast without any malignancy detected.    04/02/2018 -  Neo-Adjuvant Chemotherapy    Neoadjuvant chemotherapy with dose dense Adriamycin and Cytoxan followed by Taxol and carboplatin    04/23/2018 Genetic Testing    Positive genetic testing: A pathogenic variant was identified in BRCA2 called [id number] (p.Ala2951Glyfs*26) on the Common Hereditary Cancers Panel. The Common Hereditary Cancers Panel offered by [facility] includes sequencing and/or deletion duplication testing of the following 47 genes: APC, ATM, AXIN2, BARD1, BMPR1A, BRCA1, BRCA2, BRIP1, CDH1, CDKN2A (p14ARF), CDKN2A (p16INK4a), CKD4, CHEK2, CTNNA1, DICER1, EPCAM (Deletion/duplication testing only), GREM1 (promoter region deletion/duplication testing only), KIT, MEN1, MLH1, MSH2, MSH3, MSH6, MUTYH, NBN, NF1, NHTL1, PALB2, PDGFRA, PMS2, POLD1, POLE, PTEN, RAD50, RAD51C, RAD51D, SDHB, SDHC, SDHD, SMAD4, SMARCA4. STK11, TP53, TSC1, TSC2, and  VHL.  The following genes were evaluated for sequence changes only: SDHA and HOXB13 c.251G>A variant only. Genetic testing did detect a Variant of Unknown Significance (VUS) in the PALB2 gene called c.205C>T.  At this time, it is unknown if this variant is associated with increased cancer risk or if this is a normal finding, but most variants such as this get reclassified to being inconsequential. It should not be used to make medical management decisions.   The report date is 04/23/2018.     06/04/2018 -  Chemotherapy    The patient had ondansetron (ZOFRAN) 8 mg in sodium chloride 0.9 % 50 mL IVPB, 8 mg (100 % of original dose 8 mg), Intravenous,  Once, 1 of 1 cycle Dose modification: 8 mg (original dose 8 mg, Cycle 1) PACLitaxel-protein bound (ABRAXANE) chemo infusion 125 mg, 80 mg/m2 = 125 mg (80 % of original dose 100 mg/m2), Intravenous, Once, 1 of 3 cycles Dose modification: 80 mg/m2 (original dose 100 mg/m2, Cycle 1, Reason: Provider Judgment), 60 mg/m2 (original dose 100 mg/m2, Cycle 1, Reason: Dose not tolerated) Administration: 125 mg (06/04/2018)  for chemotherapy treatment.      CHIEF COMPLIANT: Cycle 3 of Abraxane  INTERVAL HISTORY: Samantha Mcneil is a 32 y.o. with above-mentioned history of left breast cancer currently neoadjuvant chemotherapy and she completed 4 cycles of dose dense Adriamycin and Cytoxan and is here today for cycle 3 of Abraxane. She presents to the clinic today with her parents and notes she is not doing well. She did not receive chemo last week because her neutrophils were at 1.0. She reports she is taking vitamins and oral iron. She  reports a rash on her right hand that was itchy, red, and is now peeling. She notes abnormal sweating and hot flashes frequently throughout the day that are worse at night.   REVIEW OF SYSTEMS:   Constitutional: Denies fevers, chills or abnormal weight loss (+) frequent hot flashes Eyes: Denies blurriness of vision Ears, nose,  mouth, throat, and face: Denies mucositis or sore throat Respiratory: Denies cough, dyspnea or wheezes Cardiovascular: Denies palpitation, chest discomfort Gastrointestinal:  Denies nausea, heartburn or change in bowel habits Skin: (+) rash on right hand Lymphatics: Denies new lymphadenopathy or easy bruising Neurological: Denies numbness, tingling or new weaknesses Behavioral/Psych: Mood is stable, no new changes  Extremities: No lower extremity edema Breast: denies any pain or lumps or nodules in either breasts All other systems were reviewed with the patient and are negative.  I have reviewed the past medical history, past surgical history, social history and family history with the patient and they are unchanged from previous note.  ALLERGIES:  has No Known Allergies.  MEDICATIONS:  Current Outpatient Medications  Medication Sig Dispense Refill  . ibuprofen (ADVIL,MOTRIN) 200 MG tablet Take 200 mg by mouth every 6 (six) hours as needed.    . magic mouthwash SOLN Take 5 mLs by mouth 4 (four) times daily as needed for mouth pain. 240 mL 0  . triamcinolone (KENALOG) 0.025 % ointment Apply 1 application topically 2 (two) times daily. 30 g 0  . venlafaxine XR (EFFEXOR-XR) 37.5 MG 24 hr capsule Take 1 capsule (37.5 mg total) by mouth daily with breakfast. 30 capsule 6   No current facility-administered medications for this visit.     PHYSICAL EXAMINATION: ECOG PERFORMANCE STATUS: 1 - Symptomatic but completely ambulatory  Vitals:   06/18/18 0917  BP: 111/66  Pulse: 89  Resp: 18  Temp: 98.4 F (36.9 C)  SpO2: 100%   Filed Weights   06/18/18 0917  Weight: 145 lb 9.6 oz (66 kg)    GENERAL:alert, no distress and comfortable SKIN: skin color, texture, turgor are normal, no rashes or significant lesions EYES: normal, Conjunctiva are pink and non-injected, sclera clear OROPHARYNX:no exudate, no erythema and lips, buccal mucosa, and tongue normal  NECK: supple, thyroid normal  size, non-tender, without nodularity LYMPH:  no palpable lymphadenopathy in the cervical, axillary or inguinal LUNGS: clear to auscultation and percussion with normal breathing effort HEART: regular rate & rhythm and no murmurs and no lower extremity edema ABDOMEN:abdomen soft, non-tender and normal bowel sounds MUSCULOSKELETAL:no cyanosis of digits and no clubbing  NEURO: alert & oriented x 3 with fluent speech, no focal motor/sensory deficits EXTREMITIES: No lower extremity edema  LABORATORY DATA:  I have reviewed the data as listed CMP Latest Ref Rng & Units 06/11/2018 06/04/2018 05/27/2018  Glucose 70 - 99 mg/dL 117(H) 101(H) 133(H)  BUN 6 - 20 mg/dL '9 10 10  ' Creatinine 0.44 - 1.00 mg/dL 0.59 0.63 0.66  Sodium 135 - 145 mmol/L 141 142 141  Potassium 3.5 - 5.1 mmol/L 3.8 3.6 3.7  Chloride 98 - 111 mmol/L 107 107 106  CO2 22 - 32 mmol/L '24 25 24  ' Calcium 8.9 - 10.3 mg/dL 9.2 9.2 9.1  Total Protein 6.5 - 8.1 g/dL 6.4(L) 6.7 6.8  Total Bilirubin 0.3 - 1.2 mg/dL 0.3 0.2(L) 0.2(L)  Alkaline Phos 38 - 126 U/L 76 82 111  AST 15 - 41 U/L 56(H) 23 12(L)  ALT 0 - 44 U/L 79(H) 28 16    Lab Results  Component Value  Date   WBC 2.5 (L) 06/18/2018   HGB 8.6 (L) 06/18/2018   HCT 26.1 (L) 06/18/2018   MCV 91.3 06/18/2018   PLT 168 06/18/2018   NEUTROABS 1.2 (L) 06/18/2018    ASSESSMENT & PLAN:  Malignant neoplasm of upper-outer quadrant of left breast in female, estrogen receptor negative ([place]) 03/12/2018:Palpable masses in both breasts with family history of breast cancer, breast density category D, ultrasound measured these hypoechoic irregular mass left breast 2.3 cm, right breast no abnormality, biopsy left breast mass: IDC grade 3 ER 0%, PR 0%, HER-2 negative, Ki-67 90%, lymph node biopsy benign: T2N0, stage IIb  Recommendationbased on multidisciplinary tumor board: Genetics consultation 1. Neoadjuvant chemotherapy with Adriamycin and Cytoxan dose dense 4 followed byTaxol with  carboplatinweekly 12 2. Followed by breast conserving surgery with sentinel lymph node study vs targeted axillary dissection 3. Followed by adjuvant radiation therapy  BRCA2 mutation: Patient contemplating on bilateral mastectomies with reconstruction Oophorectomy after the age of 76 ---------------------------------------------------------------------------------------------------------------- Current Treatment:Neoadjuvant chemotherapywith dose dense Adriamycin and Cytoxan x4 completed. Taxol started 05/20/2018, developed severe reaction, switched to Abraxane from cycle 2, today is cycle 3  Severe neutropenia ANC 1 last week: Held chemotherapy today ANC is 1.2, proceed with Abraxane with reduced dosage.  She will get Granix injections couple of days prior to each of her subsequent chemotherapies.  Taxol reaction: Severe rash.  The rash is slowly getting better on her hands  Return to clinic weekly for chemo and every 2 weeks for follow-up with me  No orders of the defined types were placed in this encounter.  The patient has a good understanding of the overall plan. she agrees with it. she will call with any problems that may develop before the next visit here.  Nicholas Lose, MD 06/18/2018   I, Cloyde Reams Dorshimer, am acting as scribe for Nicholas Lose, MD.  I have reviewed the above documentation for accuracy and completeness, and I agree with the above.

## 2018-06-17 NOTE — Assessment & Plan Note (Signed)
03/12/2018:Palpable masses in both breasts with family history of breast cancer, breast density category D, ultrasound measured these hypoechoic irregular mass left breast 2.3 cm, right breast no abnormality, biopsy left breast mass: IDC grade 3 ER 0%, PR 0%, HER-2 negative, Ki-67 90%, lymph node biopsy benign: T2N0, stage IIb  Recommendationbased on multidisciplinary tumor board: Genetics consultation 1. Neoadjuvant chemotherapy with Adriamycin and Cytoxan dose dense 4 followed byTaxol with carboplatinweekly 12 2. Followed by breast conserving surgery with sentinel lymph node study vs targeted axillary dissection 3. Followed by adjuvant radiation therapy  BRCA2 mutation: Patient contemplating on bilateral mastectomies with reconstruction Oophorectomy after the age of 80 ---------------------------------------------------------------------------------------------------------------- Current Treatment:Neoadjuvant chemotherapywith dose dense Adriamycin and Cytoxan x4 completed. Taxol started 05/20/2018, developed severe reaction, switching to Abraxane from cycle 2, today is cycle 3  Taxol reaction: Severe rash.  Return to clinic weekly for chemo and every 2 weeks for follow-up with me

## 2018-06-18 ENCOUNTER — Telehealth: Payer: Self-pay | Admitting: Hematology and Oncology

## 2018-06-18 ENCOUNTER — Inpatient Hospital Stay: Payer: BLUE CROSS/BLUE SHIELD

## 2018-06-18 ENCOUNTER — Inpatient Hospital Stay (HOSPITAL_BASED_OUTPATIENT_CLINIC_OR_DEPARTMENT_OTHER): Payer: BLUE CROSS/BLUE SHIELD | Admitting: Hematology and Oncology

## 2018-06-18 ENCOUNTER — Encounter: Payer: Self-pay | Admitting: *Deleted

## 2018-06-18 ENCOUNTER — Other Ambulatory Visit: Payer: Self-pay | Admitting: Hematology and Oncology

## 2018-06-18 DIAGNOSIS — D701 Agranulocytosis secondary to cancer chemotherapy: Secondary | ICD-10-CM | POA: Diagnosis not present

## 2018-06-18 DIAGNOSIS — C50412 Malignant neoplasm of upper-outer quadrant of left female breast: Secondary | ICD-10-CM | POA: Diagnosis not present

## 2018-06-18 DIAGNOSIS — Z171 Estrogen receptor negative status [ER-]: Principal | ICD-10-CM

## 2018-06-18 DIAGNOSIS — R21 Rash and other nonspecific skin eruption: Secondary | ICD-10-CM | POA: Diagnosis not present

## 2018-06-18 DIAGNOSIS — Z95828 Presence of other vascular implants and grafts: Secondary | ICD-10-CM

## 2018-06-18 DIAGNOSIS — Z1509 Genetic susceptibility to other malignant neoplasm: Secondary | ICD-10-CM

## 2018-06-18 LAB — CMP (CANCER CENTER ONLY)
ALT: 50 U/L — ABNORMAL HIGH (ref 0–44)
AST: 25 U/L (ref 15–41)
Albumin: 3.8 g/dL (ref 3.5–5.0)
Alkaline Phosphatase: 84 U/L (ref 38–126)
Anion gap: 8 (ref 5–15)
BUN: 10 mg/dL (ref 6–20)
CO2: 27 mmol/L (ref 22–32)
Calcium: 9.3 mg/dL (ref 8.9–10.3)
Chloride: 107 mmol/L (ref 98–111)
Creatinine: 0.57 mg/dL (ref 0.44–1.00)
GFR, Est AFR Am: >60 mL/min (ref >60)
GFR, Estimated: >60 mL/min (ref >60)
Glucose, Bld: 96 mg/dL (ref 70–99)
Potassium: 3.6 mmol/L (ref 3.5–5.1)
Sodium: 142 mmol/L (ref 135–145)
Total Bilirubin: 0.4 mg/dL (ref 0.3–1.2)
Total Protein: 6.6 g/dL (ref 6.5–8.1)

## 2018-06-18 LAB — CBC WITH DIFFERENTIAL (CANCER CENTER ONLY)
Abs Immature Granulocytes: 0.01 10*3/uL (ref 0.00–0.07)
Basophils Absolute: 0 10*3/uL (ref 0.0–0.1)
Basophils Relative: 0 %
Eosinophils Absolute: 0 10*3/uL (ref 0.0–0.5)
Eosinophils Relative: 1 %
HCT: 26.1 % — ABNORMAL LOW (ref 36.0–46.0)
Hemoglobin: 8.6 g/dL — ABNORMAL LOW (ref 12.0–15.0)
Immature Granulocytes: 0 %
Lymphocytes Relative: 35 %
Lymphs Abs: 0.9 10*3/uL (ref 0.7–4.0)
MCH: 30.1 pg (ref 26.0–34.0)
MCHC: 33 g/dL (ref 30.0–36.0)
MCV: 91.3 fL (ref 80.0–100.0)
Monocytes Absolute: 0.4 10*3/uL (ref 0.1–1.0)
Monocytes Relative: 17 %
Neutro Abs: 1.2 10*3/uL — ABNORMAL LOW (ref 1.7–7.7)
Neutrophils Relative %: 47 %
Platelet Count: 168 10*3/uL (ref 150–400)
RBC: 2.86 MIL/uL — ABNORMAL LOW (ref 3.87–5.11)
RDW: 18.4 % — ABNORMAL HIGH (ref 11.5–15.5)
WBC Count: 2.5 10*3/uL — ABNORMAL LOW (ref 4.0–10.5)
nRBC: 0 % (ref 0.0–0.2)

## 2018-06-18 MED ORDER — SODIUM CHLORIDE 0.9% FLUSH
10.0000 mL | Freq: Once | INTRAVENOUS | Status: AC
  Administered 2018-06-18: 10 mL
  Filled 2018-06-18: qty 10

## 2018-06-18 MED ORDER — PACLITAXEL PROTEIN-BOUND CHEMO INJECTION 100 MG
60.0000 mg/m2 | Freq: Once | INTRAVENOUS | Status: AC
  Administered 2018-06-18: 100 mg via INTRAVENOUS
  Filled 2018-06-18: qty 20

## 2018-06-18 MED ORDER — HEPARIN SOD (PORK) LOCK FLUSH 100 UNIT/ML IV SOLN
500.0000 [IU] | Freq: Once | INTRAVENOUS | Status: AC | PRN
  Administered 2018-06-18: 500 [IU]
  Filled 2018-06-18: qty 5

## 2018-06-18 MED ORDER — ONDANSETRON HCL 4 MG/2ML IJ SOLN
8.0000 mg | Freq: Once | INTRAMUSCULAR | Status: AC
  Administered 2018-06-18: 8 mg via INTRAVENOUS

## 2018-06-18 MED ORDER — SODIUM CHLORIDE 0.9% FLUSH
10.0000 mL | INTRAVENOUS | Status: DC | PRN
  Administered 2018-06-18: 10 mL
  Filled 2018-06-18: qty 10

## 2018-06-18 MED ORDER — ONDANSETRON HCL 4 MG/2ML IJ SOLN
INTRAMUSCULAR | Status: AC
  Filled 2018-06-18: qty 4

## 2018-06-18 MED ORDER — SODIUM CHLORIDE 0.9 % IV SOLN
Freq: Once | INTRAVENOUS | Status: AC
  Administered 2018-06-18: 10:00:00 via INTRAVENOUS
  Filled 2018-06-18: qty 250

## 2018-06-18 MED FILL — VENLAFAXINE HCL ER 37.5 MG: 37.5 | 30 days supply | Qty: 30 | Fill #3

## 2018-06-18 NOTE — Patient Instructions (Signed)

## 2018-06-18 NOTE — Patient Instructions (Signed)
[facility] Discharge Instructions for Patients Receiving Chemotherapy  Today you received the following chemotherapy agents Abraxane  To help prevent nausea and vomiting after your treatment, we encourage you to take your nausea medication.    If you develop nausea and vomiting that is not controlled by your nausea medication, call the clinic.   BELOW ARE SYMPTOMS THAT SHOULD BE REPORTED IMMEDIATELY:  *FEVER GREATER THAN 100.5 F  *CHILLS WITH OR WITHOUT FEVER  NAUSEA AND VOMITING THAT IS NOT CONTROLLED WITH YOUR NAUSEA MEDICATION  *UNUSUAL SHORTNESS OF BREATH  *UNUSUAL BRUISING OR BLEEDING  TENDERNESS IN MOUTH AND THROAT WITH OR WITHOUT PRESENCE OF ULCERS  *URINARY PROBLEMS  *BOWEL PROBLEMS  UNUSUAL RASH Items with * indicate a potential emergency and should be followed up as soon as possible.  Feel free to call the clinic should you have any questions or concerns. The clinic phone number is [phone].  Please show the [facility] at check-in to the Emergency Department and triage nurse.  Nanoparticle Albumin-Bound Paclitaxel injection What is this medicine? NANOPARTICLE ALBUMIN-BOUND PACLITAXEL (Na no PAHR ti kuhl al BYOO muhn-bound PAK li TAX el) is a chemotherapy drug. It targets fast dividing cells, like cancer cells, and causes these cells to die. This medicine is used to treat advanced breast cancer, lung cancer, and pancreatic cancer. This medicine may be used for other purposes; ask your health care provider or pharmacist if you have questions. COMMON BRAND NAME(S): Abraxane What should I tell my health care provider before I take this medicine? They need to know if you have any of these conditions: -kidney disease -liver disease -low blood counts, like low white cell, platelet, or red cell counts -lung or breathing disease, like asthma -tingling of the fingers or toes, or other nerve disorder -an unusual or allergic reaction to  paclitaxel, albumin, other chemotherapy, other medicines, foods, dyes, or preservatives -pregnant or trying to get pregnant -breast-feeding How should I use this medicine? This drug is given as an infusion into a vein. It is administered in a hospital or clinic by a specially trained health care professional. Talk to your pediatrician regarding the use of this medicine in children. Special care may be needed. Overdosage: If you think you have taken too much of this medicine contact a poison control center or emergency room at once. NOTE: This medicine is only for you. Do not share this medicine with others. What if I miss a dose? It is important not to miss your dose. Call your doctor or health care professional if you are unable to keep an appointment. What may interact with this medicine? This medicine may interact with the following medications: -antiviral medicines for hepatitis, HIV or AIDS -certain antibiotics like erythromycin and clarithromycin -certain medicines for fungal infections like ketoconazole and itraconazole -certain medicines for seizures like carbamazepine, phenobarbital, phenytoin -gemfibrozil -nefazodone -rifampin -St. John's wort This list may not describe all possible interactions. Give your health care provider a list of all the medicines, herbs, non-prescription drugs, or dietary supplements you use. Also tell them if you smoke, drink alcohol, or use illegal drugs. Some items may interact with your medicine. What should I watch for while using this medicine? Your condition will be monitored carefully while you are receiving this medicine. You will need important blood work done while you are taking this medicine. This medicine can cause serious allergic reactions. If you experience allergic reactions like skin rash, itching or hives, swelling of the face, lips,  or tongue, tell your doctor or health care professional right away. In some cases, you may be given  additional medicines to help with side effects. Follow all directions for their use. This drug may make you feel generally unwell. This is not uncommon, as chemotherapy can affect healthy cells as well as cancer cells. Report any side effects. Continue your course of treatment even though you feel ill unless your doctor tells you to stop. Call your doctor or health care professional for advice if you get a fever, chills or sore throat, or other symptoms of a cold or flu. Do not treat yourself. This drug decreases your body's ability to fight infections. Try to avoid being around people who are sick. This medicine may increase your risk to bruise or bleed. Call your doctor or health care professional if you notice any unusual bleeding. Be careful brushing and flossing your teeth or using a toothpick because you may get an infection or bleed more easily. If you have any dental work done, tell your dentist you are receiving this medicine. Avoid taking products that contain aspirin, acetaminophen, ibuprofen, naproxen, or ketoprofen unless instructed by your doctor. These medicines may hide a fever. Do not become pregnant while taking this medicine or for 6 months after stopping it. Women should inform their doctor if they wish to become pregnant or think they might be pregnant. Men should not father a child while taking this medicine or for 3 months after stopping it. There is a potential for serious side effects to an unborn child. Talk to your health care professional or pharmacist for more information. Do not breast-feed an infant while taking this medicine or for 2 weeks after stopping it. This medicine may interfere with the ability to get pregnant or to father a child. You should talk to your doctor or health care professional if you are concerned about your fertility. What side effects may I notice from receiving this medicine? Side effects that you should report to your doctor or health care  professional as soon as possible: -allergic reactions like skin rash, itching or hives, swelling of the face, lips, or tongue -breathing problems -changes in vision -fast, irregular heartbeat -low blood pressure -mouth sores -pain, tingling, numbness in the hands or feet -signs of decreased platelets or bleeding - bruising, pinpoint red spots on the skin, black, tarry stools, blood in the urine -signs of decreased red blood cells - unusually weak or tired, feeling faint or lightheaded, falls -signs of infection - fever or chills, cough, sore throat, pain or difficulty passing urine -signs and symptoms of liver injury like dark yellow or brown urine; general ill feeling or flu-like symptoms; light-colored stools; loss of appetite; nausea; right upper belly pain; unusually weak or tired; yellowing of the eyes or skin -swelling of the ankles, feet, hands -unusually slow heartbeat Side effects that usually do not require medical attention (report to your doctor or health care professional if they continue or are bothersome): -diarrhea -hair loss -loss of appetite -nausea, vomiting -tiredness This list may not describe all possible side effects. Call your doctor for medical advice about side effects. You may report side effects to FDA at 1-800-FDA-1088. Where should I keep my medicine? This drug is given in a hospital or clinic and will not be stored at home. NOTE: This sheet is a summary. It may not cover all possible information. If you have questions about this medicine, talk to your doctor, pharmacist, or health care provider.  2019  [employer]vier/Gold Standard (2017-02-18 13:03:45)

## 2018-06-18 NOTE — Progress Notes (Signed)
Per Dr. Lindi Adie, ok to treat today.

## 2018-06-18 NOTE — Telephone Encounter (Signed)
Gave avs and calendar ° °

## 2018-06-20 ENCOUNTER — Ambulatory Visit: Payer: BLUE CROSS/BLUE SHIELD

## 2018-06-23 ENCOUNTER — Inpatient Hospital Stay: Payer: BLUE CROSS/BLUE SHIELD

## 2018-06-23 DIAGNOSIS — Z171 Estrogen receptor negative status [ER-]: Principal | ICD-10-CM

## 2018-06-23 DIAGNOSIS — C50412 Malignant neoplasm of upper-outer quadrant of left female breast: Secondary | ICD-10-CM

## 2018-06-23 MED ORDER — TBO-FILGRASTIM 480 MCG/0.8ML ~~LOC~~ SOSY
480.0000 ug | PREFILLED_SYRINGE | Freq: Once | SUBCUTANEOUS | Status: AC
  Administered 2018-06-23: 480 ug via SUBCUTANEOUS

## 2018-06-23 MED ORDER — TBO-FILGRASTIM 480 MCG/0.8ML ~~LOC~~ SOSY
PREFILLED_SYRINGE | SUBCUTANEOUS | Status: AC
  Filled 2018-06-23: qty 0.8

## 2018-06-23 NOTE — Patient Instructions (Signed)
Pegfilgrastim injection What is this medicine? PEGFILGRASTIM (PEG fil gra stim) is a long-acting granulocyte colony-stimulating factor that stimulates the growth of neutrophils, a type of white blood cell important in the body's fight against infection. It is used to reduce the incidence of fever and infection in patients with certain types of cancer who are receiving chemotherapy that affects the bone marrow, and to increase survival after being exposed to high doses of radiation. This medicine may be used for other purposes; ask your health care provider or pharmacist if you have questions. COMMON BRAND NAME(S): Neulasta What should I tell my health care provider before I take this medicine? They need to know if you have any of these conditions: -kidney disease -latex allergy -ongoing radiation therapy -sickle cell disease -skin reactions to acrylic adhesives (On-Body Injector only) -an unusual or allergic reaction to pegfilgrastim, filgrastim, other medicines, foods, dyes, or preservatives -pregnant or trying to get pregnant -breast-feeding How should I use this medicine? This medicine is for injection under the skin. If you get this medicine at home, you will be taught how to prepare and give the pre-filled syringe or how to use the On-body Injector. Refer to the patient Instructions for Use for detailed instructions. Use exactly as directed. Tell your healthcare provider immediately if you suspect that the On-body Injector may not have performed as intended or if you suspect the use of the On-body Injector resulted in a missed or partial dose. It is important that you put your used needles and syringes in a special sharps container. Do not put them in a trash can. If you do not have a sharps container, call your pharmacist or healthcare provider to get one. Talk to your pediatrician regarding the use of this medicine in children. While this drug may be prescribed for selected conditions,  precautions do apply. Overdosage: If you think you have taken too much of this medicine contact a poison control center or emergency room at once. NOTE: This medicine is only for you. Do not share this medicine with others. What if I miss a dose? It is important not to miss your dose. Call your doctor or health care professional if you miss your dose. If you miss a dose due to an On-body Injector failure or leakage, a new dose should be administered as soon as possible using a single prefilled syringe for manual use. What may interact with this medicine? Interactions have not been studied. Give your health care provider a list of all the medicines, herbs, non-prescription drugs, or dietary supplements you use. Also tell them if you smoke, drink alcohol, or use illegal drugs. Some items may interact with your medicine. This list may not describe all possible interactions. Give your health care provider a list of all the medicines, herbs, non-prescription drugs, or dietary supplements you use. Also tell them if you smoke, drink alcohol, or use illegal drugs. Some items may interact with your medicine. What should I watch for while using this medicine? You may need blood work done while you are taking this medicine. If you are going to need a MRI, CT scan, or other procedure, tell your doctor that you are using this medicine (On-Body Injector only). What side effects may I notice from receiving this medicine? Side effects that you should report to your doctor or health care professional as soon as possible: -allergic reactions like skin rash, itching or hives, swelling of the face, lips, or tongue -dizziness -fever -pain, redness, or irritation at site   where injected -pinpoint red spots on the skin -red or dark-brown urine -shortness of breath or breathing problems -stomach or side pain, or pain at the shoulder -swelling -tiredness -trouble passing urine or change in the amount of urine Side  effects that usually do not require medical attention (report to your doctor or health care professional if they continue or are bothersome): -bone pain -muscle pain This list may not describe all possible side effects. Call your doctor for medical advice about side effects. You may report side effects to FDA at 1-800-FDA-1088. Where should I keep my medicine? Keep out of the reach of children. Store pre-filled syringes in a refrigerator between 2 and 8 degrees C (36 and 46 degrees F). Do not freeze. Keep in carton to protect from light. Throw away this medicine if it is left out of the refrigerator for more than 48 hours. Throw away any unused medicine after the expiration date. NOTE: This sheet is a summary. It may not cover all possible information. If you have questions about this medicine, talk to your doctor, pharmacist, or health care provider.  2018 Elsevier/Gold Standard (2016-06-13 12:58:03)  

## 2018-06-25 ENCOUNTER — Inpatient Hospital Stay: Payer: BLUE CROSS/BLUE SHIELD

## 2018-06-25 VITALS — BP 106/64 | HR 74 | Temp 98.6°F | Resp 17 | Ht 64.0 in | Wt 145.1 lb

## 2018-06-25 DIAGNOSIS — Z171 Estrogen receptor negative status [ER-]: Secondary | ICD-10-CM

## 2018-06-25 DIAGNOSIS — C50412 Malignant neoplasm of upper-outer quadrant of left female breast: Secondary | ICD-10-CM

## 2018-06-25 DIAGNOSIS — Z95828 Presence of other vascular implants and grafts: Secondary | ICD-10-CM

## 2018-06-25 LAB — CBC WITH DIFFERENTIAL (CANCER CENTER ONLY)
Abs Immature Granulocytes: 0.75 10*3/uL — ABNORMAL HIGH (ref 0.00–0.07)
Basophils Absolute: 0 10*3/uL (ref 0.0–0.1)
Basophils Relative: 0 %
Eosinophils Absolute: 0.2 10*3/uL (ref 0.0–0.5)
Eosinophils Relative: 1 %
HCT: 25.8 % — ABNORMAL LOW (ref 36.0–46.0)
Hemoglobin: 8.5 g/dL — ABNORMAL LOW (ref 12.0–15.0)
Immature Granulocytes: 5 %
Lymphocytes Relative: 13 %
Lymphs Abs: 1.8 10*3/uL (ref 0.7–4.0)
MCH: 30.6 pg (ref 26.0–34.0)
MCHC: 32.9 g/dL (ref 30.0–36.0)
MCV: 92.8 fL (ref 80.0–100.0)
Monocytes Absolute: 0.8 10*3/uL (ref 0.1–1.0)
Monocytes Relative: 5 %
Neutro Abs: 10.6 10*3/uL — ABNORMAL HIGH (ref 1.7–7.7)
Neutrophils Relative %: 76 %
Platelet Count: 191 10*3/uL (ref 150–400)
RBC: 2.78 MIL/uL — ABNORMAL LOW (ref 3.87–5.11)
RDW: 17.3 % — ABNORMAL HIGH (ref 11.5–15.5)
WBC Count: 14.1 10*3/uL — ABNORMAL HIGH (ref 4.0–10.5)
nRBC: 0.1 % (ref 0.0–0.2)

## 2018-06-25 LAB — CMP (CANCER CENTER ONLY)
ALT: 37 U/L (ref 0–44)
AST: 21 U/L (ref 15–41)
Albumin: 3.8 g/dL (ref 3.5–5.0)
Alkaline Phosphatase: 85 U/L (ref 38–126)
Anion gap: 6 (ref 5–15)
BUN: 11 mg/dL (ref 6–20)
CO2: 27 mmol/L (ref 22–32)
Calcium: 9.4 mg/dL (ref 8.9–10.3)
Chloride: 107 mmol/L (ref 98–111)
Creatinine: 0.62 mg/dL (ref 0.44–1.00)
GFR, Est AFR Am: >60 mL/min (ref >60)
GFR, Estimated: >60 mL/min (ref >60)
Glucose, Bld: 93 mg/dL (ref 70–99)
Potassium: 4.1 mmol/L (ref 3.5–5.1)
Sodium: 140 mmol/L (ref 135–145)
Total Bilirubin: <0.2 mg/dL — ABNORMAL LOW (ref 0.3–1.2)
Total Protein: 6.7 g/dL (ref 6.5–8.1)

## 2018-06-25 MED ORDER — HEPARIN SOD (PORK) LOCK FLUSH 100 UNIT/ML IV SOLN
500.0000 [IU] | Freq: Once | INTRAVENOUS | Status: AC | PRN
  Administered 2018-06-25: 500 [IU]
  Filled 2018-06-25: qty 5

## 2018-06-25 MED ORDER — SODIUM CHLORIDE 0.9% FLUSH
10.0000 mL | Freq: Once | INTRAVENOUS | Status: AC
  Administered 2018-06-25: 10 mL
  Filled 2018-06-25: qty 10

## 2018-06-25 MED ORDER — ONDANSETRON HCL 4 MG/2ML IJ SOLN
INTRAMUSCULAR | Status: AC
  Filled 2018-06-25: qty 4

## 2018-06-25 MED ORDER — SODIUM CHLORIDE 0.9 % IV SOLN
Freq: Once | INTRAVENOUS | Status: AC
  Administered 2018-06-25: 10:00:00 via INTRAVENOUS
  Filled 2018-06-25: qty 250

## 2018-06-25 MED ORDER — SODIUM CHLORIDE 0.9% FLUSH
10.0000 mL | INTRAVENOUS | Status: DC | PRN
  Filled 2018-06-25: qty 10

## 2018-06-25 MED ORDER — PACLITAXEL PROTEIN-BOUND CHEMO INJECTION 100 MG
60.0000 mg/m2 | Freq: Once | INTRAVENOUS | Status: AC
  Administered 2018-06-25: 100 mg via INTRAVENOUS
  Filled 2018-06-25: qty 20

## 2018-06-25 MED ORDER — ONDANSETRON HCL 4 MG/2ML IJ SOLN
8.0000 mg | Freq: Once | INTRAMUSCULAR | Status: AC
  Administered 2018-06-25: 8 mg via INTRAVENOUS

## 2018-06-25 MED ORDER — PACLITAXEL PROTEIN-BOUND CHEMO INJECTION 100 MG: 60.0000 mg/m2 | Freq: Once | Status: DC

## 2018-06-25 NOTE — Patient Instructions (Signed)
[facility] Discharge Instructions for Patients Receiving Chemotherapy  Today you received the following chemotherapy agents: Abraxane   To help prevent nausea and vomiting after your treatment, we encourage you to take your nausea medication as directed.    If you develop nausea and vomiting that is not controlled by your nausea medication, call the clinic.   BELOW ARE SYMPTOMS THAT SHOULD BE REPORTED IMMEDIATELY:  *FEVER GREATER THAN 100.5 F  *CHILLS WITH OR WITHOUT FEVER  NAUSEA AND VOMITING THAT IS NOT CONTROLLED WITH YOUR NAUSEA MEDICATION  *UNUSUAL SHORTNESS OF BREATH  *UNUSUAL BRUISING OR BLEEDING  TENDERNESS IN MOUTH AND THROAT WITH OR WITHOUT PRESENCE OF ULCERS  *URINARY PROBLEMS  *BOWEL PROBLEMS  UNUSUAL RASH Items with * indicate a potential emergency and should be followed up as soon as possible.  Feel free to call the clinic you have any questions or concerns. The clinic phone number is [phone].  Please show the CHEMO ALERT CARD at check-in to the Emergency Department and triage nurse.   

## 2018-06-25 NOTE — Patient Instructions (Signed)

## 2018-06-27 ENCOUNTER — Ambulatory Visit: Payer: BLUE CROSS/BLUE SHIELD

## 2018-06-30 ENCOUNTER — Inpatient Hospital Stay: Payer: BLUE CROSS/BLUE SHIELD

## 2018-06-30 VITALS — BP 114/70 | HR 105 | Temp 98.8°F | Resp 16

## 2018-06-30 DIAGNOSIS — C50412 Malignant neoplasm of upper-outer quadrant of left female breast: Secondary | ICD-10-CM | POA: Diagnosis not present

## 2018-06-30 DIAGNOSIS — Z171 Estrogen receptor negative status [ER-]: Principal | ICD-10-CM

## 2018-06-30 MED ORDER — TBO-FILGRASTIM 480 MCG/0.8ML ~~LOC~~ SOSY
480.0000 ug | PREFILLED_SYRINGE | Freq: Once | SUBCUTANEOUS | Status: AC
  Administered 2018-06-30: 480 ug via SUBCUTANEOUS

## 2018-06-30 MED ORDER — TBO-FILGRASTIM 480 MCG/0.8ML ~~LOC~~ SOSY
PREFILLED_SYRINGE | SUBCUTANEOUS | Status: AC
  Filled 2018-06-30: qty 0.8

## 2018-06-30 NOTE — Progress Notes (Signed)
Patient Care Team: Patient, [name] as PCP - General (General Practice)  DIAGNOSIS:    ICD-10-CM   1. Malignant neoplasm of upper-outer quadrant of left breast in female, estrogen receptor negative ([place]) C50.412    Z17.1     SUMMARY OF ONCOLOGIC HISTORY:   Malignant neoplasm of upper-outer quadrant of left breast in female, estrogen receptor negative ([place])   03/12/2018 Initial Diagnosis    Palpable masses in both breasts with family history of breast cancer, breast density category D, ultrasound measured these hypoechoic irregular mass left breast 2.3 cm, right breast no abnormality, biopsy left breast mass: IDC grade 3 ER 0%, PR 0%, HER-2 negative, Ki-67 90%, lymph node biopsy benign: T2N0    03/18/2018 Cancer Staging    Staging form: Breast, AJCC 8th Edition - Clinical: Stage IIB (cT2, cN0, cM0, G3, ER-, PR-, HER2-) - Signed by Nicholas Lose, MD on 03/18/2018    03/30/2018 Breast MRI    Single biopsy-proven malignancy in the left 12 o'clock breast measures 2.3 x 1.3 x 1.5 cm.  Right breast without any malignancy detected.    04/02/2018 -  Neo-Adjuvant Chemotherapy    Neoadjuvant chemotherapy with dose dense Adriamycin and Cytoxan followed by Taxol and carboplatin    04/23/2018 Genetic Testing    Positive genetic testing: A pathogenic variant was identified in BRCA2 called [id number] (p.Ala2951Glyfs*26) on the Common Hereditary Cancers Panel. The Common Hereditary Cancers Panel offered by [facility] includes sequencing and/or deletion duplication testing of the following 47 genes: APC, ATM, AXIN2, BARD1, BMPR1A, BRCA1, BRCA2, BRIP1, CDH1, CDKN2A (p14ARF), CDKN2A (p16INK4a), CKD4, CHEK2, CTNNA1, DICER1, EPCAM (Deletion/duplication testing only), GREM1 (promoter region deletion/duplication testing only), KIT, MEN1, MLH1, MSH2, MSH3, MSH6, MUTYH, NBN, NF1, NHTL1, PALB2, PDGFRA, PMS2, POLD1, POLE, PTEN, RAD50, RAD51C, RAD51D, SDHB, SDHC, SDHD, SMAD4, SMARCA4. STK11, TP53, TSC1, TSC2, and  VHL.  The following genes were evaluated for sequence changes only: SDHA and HOXB13 c.251G>A variant only. Genetic testing did detect a Variant of Unknown Significance (VUS) in the PALB2 gene called c.205C>T.  At this time, it is unknown if this variant is associated with increased cancer risk or if this is a normal finding, but most variants such as this get reclassified to being inconsequential. It should not be used to make medical management decisions.   The report date is 04/23/2018.     06/04/2018 -  Chemotherapy    The patient had Tbo-Filgrastim (GRANIX) injection 480 mcg, 480 mcg, Subcutaneous, Once, 1 of 1 cycle Administration: 480 mcg (06/30/2018) ondansetron (ZOFRAN) 8 mg in sodium chloride 0.9 % 50 mL IVPB, 8 mg (100 % of original dose 8 mg), Intravenous,  Once, 1 of 1 cycle Dose modification: 8 mg (original dose 8 mg, Cycle 1) PACLitaxel-protein bound (ABRAXANE) chemo infusion 125 mg, 80 mg/m2 = 125 mg (80 % of original dose 100 mg/m2), Intravenous, Once, 1 of 3 cycles Dose modification: 80 mg/m2 (original dose 100 mg/m2, Cycle 1, Reason: Provider Judgment), 60 mg/m2 (original dose 100 mg/m2, Cycle 1, Reason: Dose not tolerated) Administration: 125 mg (06/04/2018), 100 mg (06/18/2018), 100 mg (07/02/2018), 100 mg (06/25/2018)  for chemotherapy treatment.      CHIEF COMPLIANT: Cycle 5 Abraxane  INTERVAL HISTORY: Samantha Mcneil is a 32 y.o. with above-mentioned history of left breast cancer currently neoadjuvant chemotherapy andshe completed 4 cycles of dose dense Adriamycin andCytoxanand is here today for cycle 5 ofAbraxane. She presents to the clinic today with her parents and brother. She reports treatment is going well and  mild shoulder pain following injections. She denies nausea, diarrhea, or tingling or numbness of fingers or toes. She notes blackening of her nailbeds. She reviewed her medication list with me.  REVIEW OF SYSTEMS:   Constitutional: Denies fevers, chills or  abnormal weight loss (+) blackening nailbeds Eyes: Denies blurriness of vision Ears, nose, mouth, throat, and face: Denies mucositis or sore throat Respiratory: Denies cough, dyspnea or wheezes Cardiovascular: Denies palpitation, chest discomfort Gastrointestinal:  Denies nausea, heartburn or change in bowel habits Skin: Denies abnormal skin rashes MSK: (+) shoulder pain Lymphatics: Denies new lymphadenopathy or easy bruising Neurological: Denies numbness, tingling or new weaknesses Behavioral/Psych: Mood is stable, no new changes  Extremities: No lower extremity edema Breast: denies any pain or lumps or nodules in either breasts All other systems were reviewed with the patient and are negative.  I have reviewed the past medical history, past surgical history, social history and family history with the patient and they are unchanged from previous note.  ALLERGIES:  has No Known Allergies.  MEDICATIONS:  Current Outpatient Medications  Medication Sig Dispense Refill  . ibuprofen (ADVIL,MOTRIN) 200 MG tablet Take 200 mg by mouth every 6 (six) hours as needed.    . magic mouthwash SOLN Take 5 mLs by mouth 4 (four) times daily as needed for mouth pain. 240 mL 0  . triamcinolone (KENALOG) 0.025 % ointment Apply 1 application topically 2 (two) times daily. 30 g 0  . venlafaxine XR (EFFEXOR-XR) 37.5 MG 24 hr capsule Take 1 capsule (37.5 mg total) by mouth daily with breakfast. 30 capsule 6   No current facility-administered medications for this visit.    Facility-Administered Medications Ordered in Other Visits  Medication Dose Route Frequency Provider Last Rate Last Dose  . sodium chloride flush (NS) 0.9 % injection 10 mL  10 mL Intracatheter PRN Nicholas Lose, MD   10 mL at 07/02/18 1143    PHYSICAL EXAMINATION: ECOG PERFORMANCE STATUS: 1 - Symptomatic but completely ambulatory  Vitals:   07/02/18 0914  BP: 109/71  Pulse: 85  Resp: 18  Temp: 98.4 F (36.9 C)  SpO2: 100%    Filed Weights   07/02/18 0914  Weight: 147 lb 3.2 oz (66.8 kg)    GENERAL:alert, no distress and comfortable SKIN: skin color, texture, turgor are normal, no rashes or significant lesions EYES: normal, Conjunctiva are pink and non-injected, sclera clear OROPHARYNX:no exudate, no erythema and lips, buccal mucosa, and tongue normal  NECK: supple, thyroid normal size, non-tender, without nodularity LYMPH:  no palpable lymphadenopathy in the cervical, axillary or inguinal LUNGS: clear to auscultation and percussion with normal breathing effort HEART: regular rate & rhythm and no murmurs and no lower extremity edema ABDOMEN:abdomen soft, non-tender and normal bowel sounds MUSCULOSKELETAL:no cyanosis of digits and no clubbing  NEURO: alert & oriented x 3 with fluent speech, no focal motor/sensory deficits EXTREMITIES: No lower extremity edema  LABORATORY DATA:  I have reviewed the data as listed CMP Latest Ref Rng & Units 07/02/2018 06/25/2018 06/18/2018  Glucose 70 - 99 mg/dL 94 93 96  BUN 6 - 20 mg/dL '9 11 10  ' Creatinine 0.44 - 1.00 mg/dL 0.62 0.62 0.57  Sodium 135 - 145 mmol/L 141 140 142  Potassium 3.5 - 5.1 mmol/L 3.9 4.1 3.6  Chloride 98 - 111 mmol/L 107 107 107  CO2 22 - 32 mmol/L '24 27 27  ' Calcium 8.9 - 10.3 mg/dL 9.5 9.4 9.3  Total Protein 6.5 - 8.1 g/dL 6.6 6.7 6.6  Total  Bilirubin 0.3 - 1.2 mg/dL 0.3 <0.2(L) 0.4  Alkaline Phos 38 - 126 U/L 93 85 84  AST 15 - 41 U/L '25 21 25  ' ALT 0 - 44 U/L 48(H) 37 50(H)    Lab Results  Component Value Date   WBC 10.6 (H) 07/02/2018   HGB 8.8 (L) 07/02/2018   HCT 26.8 (L) 07/02/2018   MCV 96.4 07/02/2018   PLT 367 07/02/2018   NEUTROABS 8.0 (H) 07/02/2018    ASSESSMENT & PLAN:  Malignant neoplasm of upper-outer quadrant of left breast in female, estrogen receptor negative ([place]) 03/12/2018:Palpable masses in both breasts with family history of breast cancer, breast density category D, ultrasound measured these hypoechoic irregular  mass left breast 2.3 cm, right breast no abnormality, biopsy left breast mass: IDC grade 3 ER 0%, PR 0%, HER-2 negative, Ki-67 90%, lymph node biopsy benign: T2N0, stage IIb  Recommendationbased on multidisciplinary tumor board: Genetics consultation 1. Neoadjuvant chemotherapy with Adriamycin and Cytoxan dose dense 4 followed byTaxol with carboplatinweekly 12 2. Followed by breast conserving surgery with sentinel lymph node study vs targeted axillary dissection 3. Followed by adjuvant radiation therapy  BRCA2 mutation: Patient contemplating on bilateral mastectomies with reconstruction Oophorectomy after the age of 59 ---------------------------------------------------------------------------------------------------------------- Current Treatment:Neoadjuvant chemotherapywith dose dense Adriamycin and Cytoxanx4 completed. Taxol started 05/20/2018, developed severe reaction, switched to Abraxane from cycle 2, today is cycle 5  Taxol related rash: Resolved Chemo toxicities: 1.  Neutropenia: on Granix injections prior to each chemotherapy and this is helped keep her neutrophil count better. 2.  Fatigue Monitoring closely for toxicities.  She does not have any signs or symptoms of neuropathy. Patient's brother came from [place] and she is very happy to see him.  Return to clinic weekly for chemo and every 2 weeks for follow-up with me.    No orders of the defined types were placed in this encounter.  The patient has a good understanding of the overall plan. she agrees with it. she will call with any problems that may develop before the next visit here.  Nicholas Lose, MD 07/02/2018   I, Cloyde Reams Dorshimer, am acting as scribe for Nicholas Lose, MD.  I have reviewed the above documentation for accuracy and completeness, and I agree with the above.

## 2018-07-02 ENCOUNTER — Encounter: Payer: Self-pay | Admitting: *Deleted

## 2018-07-02 ENCOUNTER — Inpatient Hospital Stay: Payer: BLUE CROSS/BLUE SHIELD

## 2018-07-02 ENCOUNTER — Inpatient Hospital Stay (HOSPITAL_BASED_OUTPATIENT_CLINIC_OR_DEPARTMENT_OTHER): Payer: BLUE CROSS/BLUE SHIELD | Admitting: Hematology and Oncology

## 2018-07-02 ENCOUNTER — Inpatient Hospital Stay: Payer: BLUE CROSS/BLUE SHIELD | Attending: Hematology and Oncology

## 2018-07-02 DIAGNOSIS — Z79899 Other long term (current) drug therapy: Secondary | ICD-10-CM | POA: Diagnosis not present

## 2018-07-02 DIAGNOSIS — Z171 Estrogen receptor negative status [ER-]: Secondary | ICD-10-CM | POA: Insufficient documentation

## 2018-07-02 DIAGNOSIS — C50412 Malignant neoplasm of upper-outer quadrant of left female breast: Secondary | ICD-10-CM | POA: Insufficient documentation

## 2018-07-02 DIAGNOSIS — D701 Agranulocytosis secondary to cancer chemotherapy: Secondary | ICD-10-CM | POA: Insufficient documentation

## 2018-07-02 DIAGNOSIS — R5383 Other fatigue: Secondary | ICD-10-CM

## 2018-07-02 DIAGNOSIS — Z5111 Encounter for antineoplastic chemotherapy: Secondary | ICD-10-CM | POA: Diagnosis not present

## 2018-07-02 LAB — CBC WITH DIFFERENTIAL (CANCER CENTER ONLY)
Abs Immature Granulocytes: 0.21 10*3/uL — ABNORMAL HIGH (ref 0.00–0.07)
Basophils Absolute: 0.1 10*3/uL (ref 0.0–0.1)
Basophils Relative: 1 %
Eosinophils Absolute: 0.1 10*3/uL (ref 0.0–0.5)
Eosinophils Relative: 1 %
HCT: 26.8 % — ABNORMAL LOW (ref 36.0–46.0)
Hemoglobin: 8.8 g/dL — ABNORMAL LOW (ref 12.0–15.0)
Immature Granulocytes: 2 %
Lymphocytes Relative: 16 %
Lymphs Abs: 1.7 10*3/uL (ref 0.7–4.0)
MCH: 31.7 pg (ref 26.0–34.0)
MCHC: 32.8 g/dL (ref 30.0–36.0)
MCV: 96.4 fL (ref 80.0–100.0)
Monocytes Absolute: 0.5 10*3/uL (ref 0.1–1.0)
Monocytes Relative: 5 %
Neutro Abs: 8 10*3/uL — ABNORMAL HIGH (ref 1.7–7.7)
Neutrophils Relative %: 75 %
Platelet Count: 367 10*3/uL (ref 150–400)
RBC: 2.78 MIL/uL — ABNORMAL LOW (ref 3.87–5.11)
RDW: 17 % — ABNORMAL HIGH (ref 11.5–15.5)
WBC Count: 10.6 10*3/uL — ABNORMAL HIGH (ref 4.0–10.5)
nRBC: 0 % (ref 0.0–0.2)

## 2018-07-02 LAB — CMP (CANCER CENTER ONLY)
ALT: 48 U/L — ABNORMAL HIGH (ref 0–44)
AST: 25 U/L (ref 15–41)
Albumin: 3.8 g/dL (ref 3.5–5.0)
Alkaline Phosphatase: 93 U/L (ref 38–126)
Anion gap: 10 (ref 5–15)
BUN: 9 mg/dL (ref 6–20)
CO2: 24 mmol/L (ref 22–32)
Calcium: 9.5 mg/dL (ref 8.9–10.3)
Chloride: 107 mmol/L (ref 98–111)
Creatinine: 0.62 mg/dL (ref 0.44–1.00)
GFR, Est AFR Am: >60 mL/min (ref >60)
GFR, Estimated: >60 mL/min (ref >60)
Glucose, Bld: 94 mg/dL (ref 70–99)
Potassium: 3.9 mmol/L (ref 3.5–5.1)
Sodium: 141 mmol/L (ref 135–145)
Total Bilirubin: 0.3 mg/dL (ref 0.3–1.2)
Total Protein: 6.6 g/dL (ref 6.5–8.1)

## 2018-07-02 MED ORDER — PACLITAXEL PROTEIN-BOUND CHEMO INJECTION 100 MG
60.0000 mg/m2 | Freq: Once | INTRAVENOUS | Status: AC
  Administered 2018-07-02: 100 mg via INTRAVENOUS
  Filled 2018-07-02: qty 20

## 2018-07-02 MED ORDER — SODIUM CHLORIDE 0.9% FLUSH
10.0000 mL | INTRAVENOUS | Status: DC | PRN
  Administered 2018-07-02: 10 mL
  Filled 2018-07-02: qty 10

## 2018-07-02 MED ORDER — ONDANSETRON HCL 4 MG/2ML IJ SOLN
INTRAMUSCULAR | Status: AC
  Filled 2018-07-02: qty 4

## 2018-07-02 MED ORDER — SODIUM CHLORIDE 0.9 % IV SOLN
Freq: Once | INTRAVENOUS | Status: AC
  Administered 2018-07-02: 10:00:00 via INTRAVENOUS
  Filled 2018-07-02: qty 250

## 2018-07-02 MED ORDER — HEPARIN SOD (PORK) LOCK FLUSH 100 UNIT/ML IV SOLN
500.0000 [IU] | Freq: Once | INTRAVENOUS | Status: AC | PRN
  Administered 2018-07-02: 500 [IU]
  Filled 2018-07-02: qty 5

## 2018-07-02 MED ORDER — ONDANSETRON HCL 4 MG/2ML IJ SOLN
8.0000 mg | Freq: Once | INTRAMUSCULAR | Status: AC
  Administered 2018-07-02: 8 mg via INTRAVENOUS

## 2018-07-02 NOTE — Progress Notes (Signed)
Molecular testing information faxed to [facility] per Dr. Lindi Adie

## 2018-07-02 NOTE — Assessment & Plan Note (Signed)
03/12/2018:Palpable masses in both breasts with family history of breast cancer, breast density category D, ultrasound measured these hypoechoic irregular mass left breast 2.3 cm, right breast no abnormality, biopsy left breast mass: IDC grade 3 ER 0%, PR 0%, HER-2 negative, Ki-67 90%, lymph node biopsy benign: T2N0, stage IIb  Recommendationbased on multidisciplinary tumor board: Genetics consultation 1. Neoadjuvant chemotherapy with Adriamycin and Cytoxan dose dense 4 followed byTaxol with carboplatinweekly 12 2. Followed by breast conserving surgery with sentinel lymph node study vs targeted axillary dissection 3. Followed by adjuvant radiation therapy  BRCA2 mutation: Patient contemplating on bilateral mastectomies with reconstruction Oophorectomy after the age of 37 ---------------------------------------------------------------------------------------------------------------- Current Treatment:Neoadjuvant chemotherapywith dose dense Adriamycin and Cytoxanx4 completed. Taxol started 05/20/2018, developed severe reaction, switched to Abraxane from cycle 2, today is cycle 5  Taxol related rash: Resolved Chemo toxicities: 1.  Neutropenia: We will start patient on Granix injections prior to each chemotherapy and this is helped keep her neutrophil count better. 2.  Fatigue Monitoring closely for toxicities.  She does not have any signs or symptoms of neuropathy. Return to clinic weekly for chemo and every 2 weeks for follow-up with me.

## 2018-07-02 NOTE — Patient Instructions (Signed)
[facility] Discharge Instructions for Patients Receiving Chemotherapy  Today you received the following chemotherapy agents Paclitaxel-Protein (ABRAXANE).  To help prevent nausea and vomiting after your treatment, we encourage you to take your nausea medication as prescribed.   If you develop nausea and vomiting that is not controlled by your nausea medication, call the clinic.   BELOW ARE SYMPTOMS THAT SHOULD BE REPORTED IMMEDIATELY:  *FEVER GREATER THAN 100.5 F  *CHILLS WITH OR WITHOUT FEVER  NAUSEA AND VOMITING THAT IS NOT CONTROLLED WITH YOUR NAUSEA MEDICATION  *UNUSUAL SHORTNESS OF BREATH  *UNUSUAL BRUISING OR BLEEDING  TENDERNESS IN MOUTH AND THROAT WITH OR WITHOUT PRESENCE OF ULCERS  *URINARY PROBLEMS  *BOWEL PROBLEMS  UNUSUAL RASH Items with * indicate a potential emergency and should be followed up as soon as possible.  Feel free to call the clinic should you have any questions or concerns. The clinic phone number is [phone].  Please show the [place] [facility] at check-in to the Emergency Department and triage nurse.

## 2018-07-04 ENCOUNTER — Ambulatory Visit: Payer: BLUE CROSS/BLUE SHIELD

## 2018-07-07 ENCOUNTER — Inpatient Hospital Stay: Payer: BLUE CROSS/BLUE SHIELD

## 2018-07-07 VITALS — BP 113/69 | HR 90 | Temp 98.1°F | Resp 18

## 2018-07-07 DIAGNOSIS — Z171 Estrogen receptor negative status [ER-]: Principal | ICD-10-CM

## 2018-07-07 DIAGNOSIS — C50412 Malignant neoplasm of upper-outer quadrant of left female breast: Secondary | ICD-10-CM | POA: Diagnosis not present

## 2018-07-07 MED ORDER — TBO-FILGRASTIM 480 MCG/0.8ML ~~LOC~~ SOSY
PREFILLED_SYRINGE | SUBCUTANEOUS | Status: AC
  Filled 2018-07-07: qty 0.8

## 2018-07-07 MED ORDER — TBO-FILGRASTIM 480 MCG/0.8ML ~~LOC~~ SOSY
480.0000 ug | PREFILLED_SYRINGE | Freq: Once | SUBCUTANEOUS | Status: AC
  Administered 2018-07-07: 480 ug via SUBCUTANEOUS

## 2018-07-09 ENCOUNTER — Other Ambulatory Visit: Payer: Self-pay

## 2018-07-09 ENCOUNTER — Inpatient Hospital Stay: Payer: BLUE CROSS/BLUE SHIELD

## 2018-07-09 VITALS — BP 99/57 | HR 90 | Temp 98.1°F | Resp 18

## 2018-07-09 DIAGNOSIS — Z95828 Presence of other vascular implants and grafts: Secondary | ICD-10-CM

## 2018-07-09 DIAGNOSIS — Z171 Estrogen receptor negative status [ER-]: Secondary | ICD-10-CM

## 2018-07-09 DIAGNOSIS — N390 Urinary tract infection, site not specified: Secondary | ICD-10-CM

## 2018-07-09 DIAGNOSIS — C50412 Malignant neoplasm of upper-outer quadrant of left female breast: Secondary | ICD-10-CM

## 2018-07-09 LAB — CBC WITH DIFFERENTIAL (CANCER CENTER ONLY)
Abs Immature Granulocytes: 0.1 10*3/uL — ABNORMAL HIGH (ref 0.00–0.07)
Band Neutrophils: 9 %
Basophils Absolute: 0 10*3/uL (ref 0.0–0.1)
Basophils Relative: 0 %
Eosinophils Absolute: 0.1 10*3/uL (ref 0.0–0.5)
Eosinophils Relative: 1 %
HCT: 28.9 % — ABNORMAL LOW (ref 36.0–46.0)
Hemoglobin: 9.4 g/dL — ABNORMAL LOW (ref 12.0–15.0)
Lymphocytes Relative: 19 %
Lymphs Abs: 2 10*3/uL (ref 0.7–4.0)
MCH: 31.6 pg (ref 26.0–34.0)
MCHC: 32.5 g/dL (ref 30.0–36.0)
MCV: 97.3 fL (ref 80.0–100.0)
Metamyelocytes Relative: 1 %
Monocytes Absolute: 1 10*3/uL (ref 0.1–1.0)
Monocytes Relative: 10 %
Neutro Abs: 7.1 10*3/uL (ref 1.7–17.7)
Neutrophils Relative %: 60 %
Platelet Count: 344 10*3/uL (ref 150–400)
RBC: 2.97 MIL/uL — ABNORMAL LOW (ref 3.87–5.11)
RDW: 15.4 % (ref 11.5–15.5)
WBC Count: 10.3 10*3/uL (ref 4.0–10.5)
nRBC: 0 % (ref 0.0–0.2)

## 2018-07-09 LAB — CMP (CANCER CENTER ONLY)
ALT: 38 U/L (ref 0–44)
AST: 26 U/L (ref 15–41)
Albumin: 3.9 g/dL (ref 3.5–5.0)
Alkaline Phosphatase: 98 U/L (ref 38–126)
Anion gap: 12 (ref 5–15)
BUN: 8 mg/dL (ref 6–20)
CO2: 24 mmol/L (ref 22–32)
Calcium: 9.6 mg/dL (ref 8.9–10.3)
Chloride: 106 mmol/L (ref 98–111)
Creatinine: 0.64 mg/dL (ref 0.44–1.00)
GFR, Est AFR Am: >60 mL/min (ref >60)
GFR, Estimated: >60 mL/min (ref >60)
Glucose, Bld: 109 mg/dL — ABNORMAL HIGH (ref 70–99)
Potassium: 3.5 mmol/L (ref 3.5–5.1)
Sodium: 142 mmol/L (ref 135–145)
Total Bilirubin: 0.3 mg/dL (ref 0.3–1.2)
Total Protein: 6.9 g/dL (ref 6.5–8.1)

## 2018-07-09 LAB — URINALYSIS, COMPLETE (UACMP) WITH MICROSCOPIC
Bilirubin Urine: NEGATIVE
Glucose, UA: NEGATIVE mg/dL
Hgb urine dipstick: NEGATIVE
Ketones, ur: NEGATIVE mg/dL
Nitrite: NEGATIVE
Protein, ur: NEGATIVE mg/dL
Specific Gravity, Urine: 1.024 (ref 1.005–1.030)
WBC, UA: >50 WBC/hpf — ABNORMAL HIGH (ref 0–5)
pH: 5 (ref 5.0–8.0)

## 2018-07-09 MED ORDER — SODIUM CHLORIDE 0.9% FLUSH
10.0000 mL | INTRAVENOUS | Status: DC | PRN
  Administered 2018-07-09: 10 mL
  Filled 2018-07-09: qty 10

## 2018-07-09 MED ORDER — LEVOFLOXACIN 500 MG PO TABS: 500.0000 mg | ORAL_TABLET | Freq: Every day | ORAL | 0 refills | Status: DC

## 2018-07-09 MED ORDER — HEPARIN SOD (PORK) LOCK FLUSH 100 UNIT/ML IV SOLN
500.0000 [IU] | Freq: Once | INTRAVENOUS | Status: AC | PRN
  Administered 2018-07-09: 500 [IU]
  Filled 2018-07-09: qty 5

## 2018-07-09 MED ORDER — ONDANSETRON HCL 4 MG/2ML IJ SOLN
8.0000 mg | Freq: Once | INTRAMUSCULAR | Status: AC
  Administered 2018-07-09: 8 mg via INTRAVENOUS

## 2018-07-09 MED ORDER — SODIUM CHLORIDE 0.9 % IV SOLN
Freq: Once | INTRAVENOUS | Status: AC
  Administered 2018-07-09: 11:00:00 via INTRAVENOUS
  Filled 2018-07-09: qty 250

## 2018-07-09 MED ORDER — SODIUM CHLORIDE 0.9% FLUSH
10.0000 mL | Freq: Once | INTRAVENOUS | Status: AC
  Administered 2018-07-09: 10 mL
  Filled 2018-07-09: qty 10

## 2018-07-09 MED ORDER — PACLITAXEL PROTEIN-BOUND CHEMO INJECTION 100 MG
60.0000 mg/m2 | Freq: Once | INTRAVENOUS | Status: AC
  Administered 2018-07-09: 100 mg via INTRAVENOUS
  Filled 2018-07-09: qty 20

## 2018-07-09 MED ORDER — ONDANSETRON HCL 4 MG/2ML IJ SOLN
INTRAMUSCULAR | Status: AC
  Filled 2018-07-09: qty 2

## 2018-07-09 MED FILL — levoFLOXacin 500 MG TABS: 500 | 7 days supply | Qty: 7 | Fill #0

## 2018-07-09 NOTE — Progress Notes (Signed)
Per Dr.Gudena's review of urin culture, pt to start on levaquin for 1 week. Instruct pt to drink plenty of hydration, take medication with food, and to supplement with probiotics. Notified infusion RN to let pt know that medication was sent to pharmacy.

## 2018-07-09 NOTE — Patient Instructions (Addendum)
[facility] Discharge Instructions for Patients Receiving Chemotherapy  Today you received the following chemotherapy agents :  Abraxane.  To help prevent nausea and vomiting after your treatment, we encourage you to take your nausea medication as prescribed.   If you develop nausea and vomiting that is not controlled by your nausea medication, call the clinic.   START  LEVAQUIN FOR 7 DAYS AS PRESCRIBED.  TAKE  PROBIOTICS SUPPLEMENT,  DRINK LOTS OF FLUIDS AS TOLERATED.   TAKE MEDICATIONS WITH FOOD.   BELOW ARE SYMPTOMS THAT SHOULD BE REPORTED IMMEDIATELY:  *FEVER GREATER THAN 100.5 F  *CHILLS WITH OR WITHOUT FEVER  NAUSEA AND VOMITING THAT IS NOT CONTROLLED WITH YOUR NAUSEA MEDICATION  *UNUSUAL SHORTNESS OF BREATH  *UNUSUAL BRUISING OR BLEEDING  TENDERNESS IN MOUTH AND THROAT WITH OR WITHOUT PRESENCE OF ULCERS  *URINARY PROBLEMS  *BOWEL PROBLEMS  UNUSUAL RASH Items with * indicate a potential emergency and should be followed up as soon as possible.  Feel free to call the clinic should you have any questions or concerns. The clinic phone number is [phone].  Please show the [facility] at check-in to the Emergency Department and triage nurse.

## 2018-07-10 LAB — URINE CULTURE: Culture: NO GROWTH

## 2018-07-11 ENCOUNTER — Ambulatory Visit: Payer: BLUE CROSS/BLUE SHIELD

## 2018-07-14 ENCOUNTER — Inpatient Hospital Stay: Payer: BLUE CROSS/BLUE SHIELD

## 2018-07-14 VITALS — BP 112/73 | Temp 98.2°F | Resp 18

## 2018-07-14 DIAGNOSIS — Z171 Estrogen receptor negative status [ER-]: Principal | ICD-10-CM

## 2018-07-14 DIAGNOSIS — C50412 Malignant neoplasm of upper-outer quadrant of left female breast: Secondary | ICD-10-CM

## 2018-07-14 MED ORDER — TBO-FILGRASTIM 480 MCG/0.8ML ~~LOC~~ SOSY
PREFILLED_SYRINGE | SUBCUTANEOUS | Status: AC
  Filled 2018-07-14: qty 0.8

## 2018-07-14 MED ORDER — TBO-FILGRASTIM 300 MCG/0.5ML ~~LOC~~ SOSY
PREFILLED_SYRINGE | SUBCUTANEOUS | Status: AC
  Filled 2018-07-14: qty 0.5

## 2018-07-14 MED ORDER — TBO-FILGRASTIM 480 MCG/0.8ML ~~LOC~~ SOSY
480.0000 ug | PREFILLED_SYRINGE | Freq: Once | SUBCUTANEOUS | Status: AC
  Administered 2018-07-14: 480 ug via SUBCUTANEOUS

## 2018-07-14 NOTE — Patient Instructions (Signed)
Tbo-Filgrastim injection What is this medicine? TBO-FILGRASTIM (T B O fil GRA stim) is a granulocyte colony-stimulating factor that stimulates the growth of neutrophils, a type of white blood cell important in the body's fight against infection. It is used to reduce the incidence of fever and infection in patients with certain types of cancer who are receiving chemotherapy that affects the bone marrow. This medicine may be used for other purposes; ask your health care provider or pharmacist if you have questions. COMMON BRAND NAME(S): Granix What should I tell my health care provider before I take this medicine? They need to know if you have any of these conditions: -bone scan or tests planned -kidney disease -sickle cell anemia -an unusual or allergic reaction to tbo-filgrastim, filgrastim, pegfilgrastim, other medicines, foods, dyes, or preservatives -pregnant or trying to get pregnant -breast-feeding How should I use this medicine? This medicine is for injection under the skin. If you get this medicine at home, you will be taught how to prepare and give this medicine. Refer to the Instructions for Use that come with your medication packaging. Use exactly as directed. Take your medicine at regular intervals. Do not take your medicine more often than directed. It is important that you put your used needles and syringes in a special sharps container. Do not put them in a trash can. If you do not have a sharps container, call your pharmacist or healthcare provider to get one. Talk to your pediatrician regarding the use of this medicine in children. While this drug may be prescribed for children as young as 1 month of age for selected conditions, precautions do apply. Overdosage: If you think you have taken too much of this medicine contact a poison control center or emergency room at once. NOTE: This medicine is only for you. Do not share this medicine with others. What if I miss a dose? It is  important not to miss your dose. Call your doctor or health care professional if you miss a dose. What may interact with this medicine? This medicine may interact with the following medications: -medicines that may cause a release of neutrophils, such as lithium This list may not describe all possible interactions. Give your health care provider a list of all the medicines, herbs, non-prescription drugs, or dietary supplements you use. Also tell them if you smoke, drink alcohol, or use illegal drugs. Some items may interact with your medicine. What should I watch for while using this medicine? You may need blood work done while you are taking this medicine. What side effects may I notice from receiving this medicine? Side effects that you should report to your doctor or health care professional as soon as possible: -allergic reactions like skin rash, itching or hives, swelling of the face, lips, or tongue -back pain -blood in the urine -dark urine -dizziness -fast heartbeat -feeling faint -shortness of breath or breathing problems -signs and symptoms of infection like fever or chills; cough; or sore throat -signs and symptoms of kidney injury like trouble passing urine or change in the amount of urine -stomach or side pain, or pain at the shoulder -sweating -swelling of the legs, ankles, or abdomen -tiredness Side effects that usually do not require medical attention (report to your doctor or health care professional if they continue or are bothersome): -bone pain -diarrhea -headache -muscle pain -vomiting This list may not describe all possible side effects. Call your doctor for medical advice about side effects. You may report side effects to FDA at   1-800-FDA-1088. Where should I keep my medicine? Keep out of the reach of children. Store in a refrigerator between 2 and 8 degrees C (36 and 46 degrees F). Keep in carton to protect from light. Throw away this medicine if it is left out  of the refrigerator for more than 5 consecutive days. Throw away any unused medicine after the expiration date. NOTE: This sheet is a summary. It may not cover all possible information. If you have questions about this medicine, talk to your doctor, pharmacist, or health care provider.  2019 Elsevier/Gold Standard (2017-02-04 16:56:18)  

## 2018-07-14 NOTE — Progress Notes (Signed)
Patient Care Team: Patient, [name] as PCP - General (General Practice)  DIAGNOSIS:    ICD-10-CM   1. Malignant neoplasm of upper-outer quadrant of left breast in female, estrogen receptor negative ([place]) C50.412    Z17.1     SUMMARY OF ONCOLOGIC HISTORY:   Malignant neoplasm of upper-outer quadrant of left breast in female, estrogen receptor negative ([place])   03/12/2018 Initial Diagnosis    Palpable masses in both breasts with family history of breast cancer, breast density category D, ultrasound measured these hypoechoic irregular mass left breast 2.3 cm, right breast no abnormality, biopsy left breast mass: IDC grade 3 ER 0%, PR 0%, HER-2 negative, Ki-67 90%, lymph node biopsy benign: T2N0    03/18/2018 Cancer Staging    Staging form: Breast, AJCC 8th Edition - Clinical: Stage IIB (cT2, cN0, cM0, G3, ER-, PR-, HER2-) - Signed by Nicholas Lose, MD on 03/18/2018    03/30/2018 Breast MRI    Single biopsy-proven malignancy in the left 12 o'clock breast measures 2.3 x 1.3 x 1.5 cm.  Right breast without any malignancy detected.    04/02/2018 -  Neo-Adjuvant Chemotherapy    Neoadjuvant chemotherapy with dose dense Adriamycin and Cytoxan followed by Taxol and carboplatin    04/23/2018 Genetic Testing    Positive genetic testing: A pathogenic variant was identified in BRCA2 called [id number] (p.Ala2951Glyfs*26) on the Common Hereditary Cancers Panel. The Common Hereditary Cancers Panel offered by [facility] includes sequencing and/or deletion duplication testing of the following 47 genes: APC, ATM, AXIN2, BARD1, BMPR1A, BRCA1, BRCA2, BRIP1, CDH1, CDKN2A (p14ARF), CDKN2A (p16INK4a), CKD4, CHEK2, CTNNA1, DICER1, EPCAM (Deletion/duplication testing only), GREM1 (promoter region deletion/duplication testing only), KIT, MEN1, MLH1, MSH2, MSH3, MSH6, MUTYH, NBN, NF1, NHTL1, PALB2, PDGFRA, PMS2, POLD1, POLE, PTEN, RAD50, RAD51C, RAD51D, SDHB, SDHC, SDHD, SMAD4, SMARCA4. STK11, TP53, TSC1, TSC2, and  VHL.  The following genes were evaluated for sequence changes only: SDHA and HOXB13 c.251G>A variant only. Genetic testing did detect a Variant of Unknown Significance (VUS) in the PALB2 gene called c.205C>T.  At this time, it is unknown if this variant is associated with increased cancer risk or if this is a normal finding, but most variants such as this get reclassified to being inconsequential. It should not be used to make medical management decisions.   The report date is 04/23/2018.     06/04/2018 -  Chemotherapy    The patient had Tbo-Filgrastim (GRANIX) injection 480 mcg, 480 mcg, Subcutaneous, Once, 1 of 1 cycle Administration: 480 mcg (06/30/2018) ondansetron (ZOFRAN) 8 mg in sodium chloride 0.9 % 50 mL IVPB, 8 mg (100 % of original dose 8 mg), Intravenous,  Once, 1 of 1 cycle Dose modification: 8 mg (original dose 8 mg, Cycle 1) CARBOplatin (PARAPLATIN) 260 mg in sodium chloride 0.9 % 100 mL chemo infusion, 260 mg (100 % of original dose 258.4 mg), Intravenous,  Once, 1 of 2 cycles Dose modification:   (original dose 258.4 mg, Cycle 2),   (original dose 258.4 mg, Cycle 3) PACLitaxel-protein bound (ABRAXANE) chemo infusion 125 mg, 80 mg/m2 = 125 mg (80 % of original dose 100 mg/m2), Intravenous, Once, 2 of 3 cycles Dose modification: 80 mg/m2 (original dose 100 mg/m2, Cycle 1, Reason: Provider Judgment), 60 mg/m2 (original dose 100 mg/m2, Cycle 1, Reason: Dose not tolerated) Administration: 125 mg (06/04/2018), 100 mg (06/18/2018), 100 mg (07/02/2018), 100 mg (07/09/2018), 100 mg (06/25/2018)  for chemotherapy treatment.      CHIEF COMPLIANT: Cycle 7 Abraxane  INTERVAL HISTORY:  Samantha Mcneil is a 33 y.o. with above-mentioned history of left breast cancer currently neoadjuvant chemotherapy andshe completed 4 cycles of dose dense Adriamycin andCytoxanand is here today forcycle7 ofAbraxane. She presents to the clinic today with her parents and brothers. She reports her rash is gone.  She denies any neuropathy but reports a boyle in her right foot. She is very fatigued. She reports one episode of nausea that resolved with eating. She reports intermittent moderate lower back pain, a 6/10, which she attributes to a UTI, for which she has taken antibiotics but the back pain has persisted. She has had it for the last two weeks and has not taken anything for it. She missed one cycle of carboplatin last week and will receive it today. She requested a refill of her port cream.   REVIEW OF SYSTEMS:   Constitutional: Denies fevers, chills or abnormal weight loss (+) fatigue Eyes: Denies blurriness of vision Ears, nose, mouth, throat, and face: Denies mucositis or sore throat Respiratory: Denies cough, dyspnea or wheezes Cardiovascular: Denies palpitation, chest discomfort Gastrointestinal:  Denies heartburn or change in bowel habits (+) nausea Skin: Denies abnormal skin rashes MSK (+) lower back pain Lymphatics: Denies new lymphadenopathy or easy bruising Neurological: Denies numbness, tingling or new weaknesses Behavioral/Psych: Mood is stable, no new changes  Extremities: No lower extremity edema Breast: denies any pain or lumps or nodules in either breasts All other systems were reviewed with the patient and are negative.  I have reviewed the past medical history, past surgical history, social history and family history with the patient and they are unchanged from previous note.  ALLERGIES:  has No Known Allergies.  MEDICATIONS:  Current Outpatient Medications  Medication Sig Dispense Refill  . ibuprofen (ADVIL,MOTRIN) 200 MG tablet Take 200 mg by mouth every 6 (six) hours as needed.    Marland Kitchen levofloxacin (LEVAQUIN) 500 MG tablet Take 1 tablet (500 mg total) by mouth daily. 7 tablet 0  . magic mouthwash SOLN Take 5 mLs by mouth 4 (four) times daily as needed for mouth pain. 240 mL 0  . triamcinolone (KENALOG) 0.025 % ointment Apply 1 application topically 2 (two) times daily. 30  g 0  . venlafaxine XR (EFFEXOR-XR) 37.5 MG 24 hr capsule Take 1 capsule (37.5 mg total) by mouth daily with breakfast. 30 capsule 6   No current facility-administered medications for this visit.    Facility-Administered Medications Ordered in Other Visits  Medication Dose Route Frequency Provider Last Rate Last Dose  . heparin lock flush 100 unit/mL  500 Units Intracatheter Once Nicholas Lose, MD        PHYSICAL EXAMINATION: ECOG PERFORMANCE STATUS: 1 - Symptomatic but completely ambulatory  Vitals:   07/16/18 0918  BP: 110/71  Pulse: 86  Resp: 18  Temp: 97.9 F (36.6 C)  SpO2: 100%   Filed Weights   07/16/18 0918  Weight: 148 lb 3.2 oz (67.2 kg)    GENERAL: alert, no distress and comfortable SKIN: skin color, texture, turgor are normal, no rashes or significant lesions EYES: normal, Conjunctiva are pink and non-injected, sclera clear OROPHARYNX: no exudate, no erythema and lips, buccal mucosa, and tongue normal  NECK: supple, thyroid normal size, non-tender, without nodularity LYMPH: no palpable lymphadenopathy in the cervical, axillary or inguinal LUNGS: clear to auscultation and percussion with normal breathing effort HEART: regular rate & rhythm and no murmurs and no lower extremity edema ABDOMEN: abdomen soft, non-tender and normal bowel sounds MUSCULOSKELETAL: no cyanosis of digits and no  clubbing  NEURO: alert & oriented x 3 with fluent speech, no focal motor/sensory deficits EXTREMITIES: No lower extremity edema  LABORATORY DATA:  I have reviewed the data as listed CMP Latest Ref Rng & Units 07/09/2018 07/02/2018 06/25/2018  Glucose 70 - 99 mg/dL 109(H) 94 93  BUN 6 - 20 mg/dL _0 Creatinine 0.44 - 1.00 mg/dL 0.64 0.62 0.62  Sodium 135 - 145 mmol/L 142 141 140  Potassium 3.5 - 5.1 mmol/L 3.5 3.9 4.1  Chloride 98 - 111 mmol/L 106 107 107  CO2 22 - 32 mmol/L _1 Calcium 8.9 - 10.3 mg/dL 9.6 9.5 9.4  Total Protein 6.5 - 8.1 g/dL 6.9 6.6 6.7  Total  Bilirubin 0.3 - 1.2 mg/dL 0.3 0.3 <0.2(L)  Alkaline Phos 38 - 126 U/L 98 93 85  AST 15 - 41 U/L _2 ALT 0 - 44 U/L 38 48(H) 37    Lab Results  Component Value Date   WBC 11.4 (H) 07/16/2018   HGB 9.7 (L) 07/16/2018   HCT 29.6 (L) 07/16/2018   MCV 97.7 07/16/2018   PLT 302 07/16/2018   NEUTROABS PENDING 07/16/2018    ASSESSMENT & PLAN:  Malignant neoplasm of upper-outer quadrant of left breast in female, estrogen receptor negative ([place]) 03/12/2018:Palpable masses in both breasts with family history of breast cancer, breast density category D, ultrasound measured these hypoechoic irregular mass left breast 2.3 cm, right breast no abnormality, biopsy left breast mass: IDC grade 3 ER 0%, PR 0%, HER-2 negative, Ki-67 90%, lymph node biopsy benign: T2N0, stage IIb  Recommendationbased on multidisciplinary tumor board: Genetics consultation 1. Neoadjuvant chemotherapy with Adriamycin and Cytoxan dose dense 4 followed byTaxol with carboplatinweekly 12 2. Followed by breast conserving surgery with sentinel lymph node study vs targeted axillary dissection 3. Followed by adjuvant radiation therapy  BRCA2 mutation: Patient contemplating on bilateral mastectomies with reconstruction Oophorectomy after the age of 24 ---------------------------------------------------------------------------------------------------------------- Current Treatment:Neoadjuvant chemotherapywith dose dense Adriamycin and Cytoxanx4 completed. Taxol started 05/20/2018, developed severe reaction, switchedto Abraxane from cycle 2, today is cycle 7  Taxol related rash: Resolved Chemo toxicities: 1.  Neutropenia: on Granix injections prior to each chemotherapy and this is helped keep her neutrophil count better. 2.  Fatigue Since we switched to Abraxane, carboplatin has been dropped off. We will add carboplatin to today's chemotherapy. Return to clinic weekly for chemotherapy and every 2 weeks for  follow-up with me.    No orders of the defined types were placed in this encounter.  The patient has a good understanding of the overall plan. she agrees with it. she will call with any problems that may develop before the next visit here.  Nicholas Lose, MD 07/16/2018  Julious Oka Dorshimer am acting as scribe for Dr. Nicholas Lose.  I have reviewed the above documentation for accuracy and completeness, and I agree with the above.

## 2018-07-16 ENCOUNTER — Inpatient Hospital Stay: Payer: BLUE CROSS/BLUE SHIELD

## 2018-07-16 ENCOUNTER — Telehealth: Payer: Self-pay | Admitting: Hematology and Oncology

## 2018-07-16 ENCOUNTER — Inpatient Hospital Stay (HOSPITAL_BASED_OUTPATIENT_CLINIC_OR_DEPARTMENT_OTHER): Payer: BLUE CROSS/BLUE SHIELD | Admitting: Hematology and Oncology

## 2018-07-16 ENCOUNTER — Other Ambulatory Visit: Payer: Self-pay | Admitting: Hematology and Oncology

## 2018-07-16 DIAGNOSIS — Z171 Estrogen receptor negative status [ER-]: Principal | ICD-10-CM

## 2018-07-16 DIAGNOSIS — C50412 Malignant neoplasm of upper-outer quadrant of left female breast: Secondary | ICD-10-CM

## 2018-07-16 DIAGNOSIS — D701 Agranulocytosis secondary to cancer chemotherapy: Secondary | ICD-10-CM | POA: Diagnosis not present

## 2018-07-16 DIAGNOSIS — R5383 Other fatigue: Secondary | ICD-10-CM

## 2018-07-16 DIAGNOSIS — Z95828 Presence of other vascular implants and grafts: Secondary | ICD-10-CM

## 2018-07-16 DIAGNOSIS — Z1509 Genetic susceptibility to other malignant neoplasm: Secondary | ICD-10-CM

## 2018-07-16 LAB — CBC WITH DIFFERENTIAL (CANCER CENTER ONLY)
Abs Immature Granulocytes: 0.43 10*3/uL — ABNORMAL HIGH (ref 0.00–0.07)
Basophils Absolute: 0.1 10*3/uL (ref 0.0–0.1)
Basophils Relative: 1 %
Eosinophils Absolute: 0.1 10*3/uL (ref 0.0–0.5)
Eosinophils Relative: 1 %
HCT: 29.6 % — ABNORMAL LOW (ref 36.0–46.0)
Hemoglobin: 9.7 g/dL — ABNORMAL LOW (ref 12.0–15.0)
Immature Granulocytes: 4 %
Lymphocytes Relative: 14 %
Lymphs Abs: 1.6 10*3/uL (ref 0.7–4.0)
MCH: 32 pg (ref 26.0–34.0)
MCHC: 32.8 g/dL (ref 30.0–36.0)
MCV: 97.7 fL (ref 80.0–100.0)
Monocytes Absolute: 0.7 10*3/uL (ref 0.1–1.0)
Monocytes Relative: 6 %
Neutro Abs: 8.6 10*3/uL — ABNORMAL HIGH (ref 1.7–7.7)
Neutrophils Relative %: 74 %
Platelet Count: 302 10*3/uL (ref 150–400)
RBC: 3.03 MIL/uL — ABNORMAL LOW (ref 3.87–5.11)
RDW: 14 % (ref 11.5–15.5)
WBC Count: 11.4 10*3/uL — ABNORMAL HIGH (ref 4.0–10.5)
nRBC: 0 % (ref 0.0–0.2)

## 2018-07-16 LAB — CMP (CANCER CENTER ONLY)
ALT: 28 U/L (ref 0–44)
AST: 21 U/L (ref 15–41)
Albumin: 3.8 g/dL (ref 3.5–5.0)
Alkaline Phosphatase: 98 U/L (ref 38–126)
Anion gap: 10 (ref 5–15)
BUN: 8 mg/dL (ref 6–20)
CO2: 24 mmol/L (ref 22–32)
Calcium: 9.5 mg/dL (ref 8.9–10.3)
Chloride: 107 mmol/L (ref 98–111)
Creatinine: 0.65 mg/dL (ref 0.44–1.00)
GFR, Est AFR Am: >60 mL/min (ref >60)
GFR, Estimated: >60 mL/min (ref >60)
Glucose, Bld: 132 mg/dL — ABNORMAL HIGH (ref 70–99)
Potassium: 3.6 mmol/L (ref 3.5–5.1)
Sodium: 141 mmol/L (ref 135–145)
Total Bilirubin: 0.2 mg/dL — ABNORMAL LOW (ref 0.3–1.2)
Total Protein: 6.7 g/dL (ref 6.5–8.1)

## 2018-07-16 MED ORDER — DEXAMETHASONE SODIUM PHOSPHATE 10 MG/ML IJ SOLN
10.0000 mg | Freq: Once | INTRAMUSCULAR | Status: AC
  Administered 2018-07-16: 10 mg via INTRAVENOUS

## 2018-07-16 MED ORDER — SODIUM CHLORIDE 0.9 % IV SOLN
Freq: Once | INTRAVENOUS | Status: AC
  Administered 2018-07-16: 10:00:00 via INTRAVENOUS
  Filled 2018-07-16: qty 250

## 2018-07-16 MED ORDER — ONDANSETRON HCL 4 MG/2ML IJ SOLN: 8.0000 mg | Freq: Once | INTRAMUSCULAR | Status: DC

## 2018-07-16 MED ORDER — SODIUM CHLORIDE 0.9% FLUSH
10.0000 mL | INTRAVENOUS | Status: DC | PRN
  Administered 2018-07-16: 10 mL
  Filled 2018-07-16: qty 10

## 2018-07-16 MED ORDER — HEPARIN SOD (PORK) LOCK FLUSH 100 UNIT/ML IV SOLN
500.0000 [IU] | Freq: Once | INTRAVENOUS | Status: AC | PRN
  Administered 2018-07-16: 500 [IU]
  Filled 2018-07-16: qty 5

## 2018-07-16 MED ORDER — ONDANSETRON HCL 4 MG/2ML IJ SOLN
INTRAMUSCULAR | Status: AC
  Filled 2018-07-16: qty 4

## 2018-07-16 MED ORDER — HEPARIN SOD (PORK) LOCK FLUSH 100 UNIT/ML IV SOLN
500.0000 [IU] | Freq: Once | INTRAVENOUS | Status: DC
  Filled 2018-07-16: qty 5

## 2018-07-16 MED ORDER — SODIUM CHLORIDE 0.9% FLUSH
10.0000 mL | Freq: Once | INTRAVENOUS | Status: AC
  Administered 2018-07-16: 10 mL
  Filled 2018-07-16: qty 10

## 2018-07-16 MED ORDER — TRIAMCINOLONE ACETONIDE 0.025 % EX OINT: 1.0000 "application " | TOPICAL_OINTMENT | Freq: Two times a day (BID) | CUTANEOUS | 1 refills | Status: DC

## 2018-07-16 MED ORDER — DEXAMETHASONE SODIUM PHOSPHATE 10 MG/ML IJ SOLN
INTRAMUSCULAR | Status: AC
  Filled 2018-07-16: qty 1

## 2018-07-16 MED ORDER — PALONOSETRON HCL INJECTION 0.25 MG/5ML
INTRAVENOUS | Status: AC
  Filled 2018-07-16: qty 5

## 2018-07-16 MED ORDER — PALONOSETRON HCL INJECTION 0.25 MG/5ML
0.2500 mg | Freq: Once | INTRAVENOUS | Status: AC
  Administered 2018-07-16: 0.25 mg via INTRAVENOUS

## 2018-07-16 MED ORDER — SODIUM CHLORIDE 0.9 % IV SOLN
258.4000 mg | Freq: Once | INTRAVENOUS | Status: AC
  Administered 2018-07-16: 260 mg via INTRAVENOUS
  Filled 2018-07-16: qty 26

## 2018-07-16 MED ORDER — PACLITAXEL PROTEIN-BOUND CHEMO INJECTION 100 MG
60.0000 mg/m2 | Freq: Once | INTRAVENOUS | Status: AC
  Administered 2018-07-16: 100 mg via INTRAVENOUS
  Filled 2018-07-16: qty 20

## 2018-07-16 MED ORDER — LIDOCAINE-PRILOCAINE 2.5-2.5 % EX CREA: TOPICAL_CREAM | CUTANEOUS | 1 refills | Status: DC

## 2018-07-16 MED FILL — TRIAMCINOLONE 0.025% OINT: 0.025 | 20 days supply | Qty: 30 | Fill #0

## 2018-07-16 MED FILL — LIDOCAINE-PRILOCAINE CREAM: 2.5-2.5 | 20 days supply | Qty: 30 | Fill #0

## 2018-07-16 NOTE — Patient Instructions (Signed)
[facility] Discharge Instructions for Patients Receiving Chemotherapy  Today you received the following chemotherapy agents: Carboplatin (Paraplatin) and Paclitaxel protein-bound (Abraxane)  To help prevent nausea and vomiting after your treatment, we encourage you to take your nausea medication as directed.    If you develop nausea and vomiting that is not controlled by your nausea medication, call the clinic.   BELOW ARE SYMPTOMS THAT SHOULD BE REPORTED IMMEDIATELY:  *FEVER GREATER THAN 100.5 F  *CHILLS WITH OR WITHOUT FEVER  NAUSEA AND VOMITING THAT IS NOT CONTROLLED WITH YOUR NAUSEA MEDICATION  *UNUSUAL SHORTNESS OF BREATH  *UNUSUAL BRUISING OR BLEEDING  TENDERNESS IN MOUTH AND THROAT WITH OR WITHOUT PRESENCE OF ULCERS  *URINARY PROBLEMS  *BOWEL PROBLEMS  UNUSUAL RASH Items with * indicate a potential emergency and should be followed up as soon as possible.  Feel free to call the clinic should you have any questions or concerns. The clinic phone number is [phone].  Please show the [place] at check-in to the Emergency Department and triage nurse.

## 2018-07-16 NOTE — Telephone Encounter (Signed)
Per 1/16 gave avs and calendar MD on pal 2/13 will see patient on last visit

## 2018-07-16 NOTE — Patient Instructions (Signed)

## 2018-07-16 NOTE — Addendum Note (Signed)
Addended by: Nicholas Lose on: 07/16/2018 10:21 AM   Modules accepted: Orders

## 2018-07-16 NOTE — Assessment & Plan Note (Addendum)
03/12/2018:Palpable masses in both breasts with family history of breast cancer, breast density category D, ultrasound measured these hypoechoic irregular mass left breast 2.3 cm, right breast no abnormality, biopsy left breast mass: IDC grade 3 ER 0%, PR 0%, HER-2 negative, Ki-67 90%, lymph node biopsy benign: T2N0, stage IIb  Recommendationbased on multidisciplinary tumor board: Genetics consultation 1. Neoadjuvant chemotherapy with Adriamycin and Cytoxan dose dense 4 followed byTaxol with carboplatinweekly 12 2. Followed by breast conserving surgery with sentinel lymph node study vs targeted axillary dissection 3. Followed by adjuvant radiation therapy  BRCA2 mutation: Patient contemplating on bilateral mastectomies with reconstruction Oophorectomy after the age of 62 ---------------------------------------------------------------------------------------------------------------- Current Treatment:Neoadjuvant chemotherapywith dose dense Adriamycin and Cytoxanx4 completed. Taxol started 05/20/2018, developed severe reaction, switchedto Abraxane from cycle 2, today is cycle 7  Taxol related rash: Resolved Chemo toxicities: 1.  Neutropenia: on Granix injections prior to each chemotherapy and this is helped keep her neutrophil count better. 2.  Fatigue Since we switched to Abraxane, carboplatin has been dropped off. We will add carboplatin to today's chemotherapy. Return to clinic weekly for chemotherapy and every 2 weeks for follow-up with me.

## 2018-07-18 ENCOUNTER — Ambulatory Visit: Payer: BLUE CROSS/BLUE SHIELD

## 2018-07-21 ENCOUNTER — Inpatient Hospital Stay: Payer: BLUE CROSS/BLUE SHIELD

## 2018-07-21 DIAGNOSIS — Z171 Estrogen receptor negative status [ER-]: Principal | ICD-10-CM

## 2018-07-21 DIAGNOSIS — C50412 Malignant neoplasm of upper-outer quadrant of left female breast: Secondary | ICD-10-CM | POA: Diagnosis not present

## 2018-07-21 MED ORDER — TBO-FILGRASTIM 480 MCG/0.8ML ~~LOC~~ SOSY
PREFILLED_SYRINGE | SUBCUTANEOUS | Status: AC
  Filled 2018-07-21: qty 0.8

## 2018-07-21 MED ORDER — TBO-FILGRASTIM 480 MCG/0.8ML ~~LOC~~ SOSY
480.0000 ug | PREFILLED_SYRINGE | Freq: Once | SUBCUTANEOUS | Status: AC
  Administered 2018-07-21: 480 ug via SUBCUTANEOUS

## 2018-07-21 MED FILL — VENLAFAXINE HCL ER 37.5 MG: 37.5 | 30 days supply | Qty: 30 | Fill #4

## 2018-07-21 NOTE — Patient Instructions (Signed)
Pegfilgrastim injection What is this medicine? PEGFILGRASTIM (PEG fil gra stim) is a long-acting granulocyte colony-stimulating factor that stimulates the growth of neutrophils, a type of white blood cell important in the body's fight against infection. It is used to reduce the incidence of fever and infection in patients with certain types of cancer who are receiving chemotherapy that affects the bone marrow, and to increase survival after being exposed to high doses of radiation. This medicine may be used for other purposes; ask your health care provider or pharmacist if you have questions. COMMON BRAND NAME(S): Neulasta What should I tell my health care provider before I take this medicine? They need to know if you have any of these conditions: -kidney disease -latex allergy -ongoing radiation therapy -sickle cell disease -skin reactions to acrylic adhesives (On-Body Injector only) -an unusual or allergic reaction to pegfilgrastim, filgrastim, other medicines, foods, dyes, or preservatives -pregnant or trying to get pregnant -breast-feeding How should I use this medicine? This medicine is for injection under the skin. If you get this medicine at home, you will be taught how to prepare and give the pre-filled syringe or how to use the On-body Injector. Refer to the patient Instructions for Use for detailed instructions. Use exactly as directed. Tell your healthcare provider immediately if you suspect that the On-body Injector may not have performed as intended or if you suspect the use of the On-body Injector resulted in a missed or partial dose. It is important that you put your used needles and syringes in a special sharps container. Do not put them in a trash can. If you do not have a sharps container, call your pharmacist or healthcare provider to get one. Talk to your pediatrician regarding the use of this medicine in children. While this drug may be prescribed for selected conditions,  precautions do apply. Overdosage: If you think you have taken too much of this medicine contact a poison control center or emergency room at once. NOTE: This medicine is only for you. Do not share this medicine with others. What if I miss a dose? It is important not to miss your dose. Call your doctor or health care professional if you miss your dose. If you miss a dose due to an On-body Injector failure or leakage, a new dose should be administered as soon as possible using a single prefilled syringe for manual use. What may interact with this medicine? Interactions have not been studied. Give your health care provider a list of all the medicines, herbs, non-prescription drugs, or dietary supplements you use. Also tell them if you smoke, drink alcohol, or use illegal drugs. Some items may interact with your medicine. This list may not describe all possible interactions. Give your health care provider a list of all the medicines, herbs, non-prescription drugs, or dietary supplements you use. Also tell them if you smoke, drink alcohol, or use illegal drugs. Some items may interact with your medicine. What should I watch for while using this medicine? You may need blood work done while you are taking this medicine. If you are going to need a MRI, CT scan, or other procedure, tell your doctor that you are using this medicine (On-Body Injector only). What side effects may I notice from receiving this medicine? Side effects that you should report to your doctor or health care professional as soon as possible: -allergic reactions like skin rash, itching or hives, swelling of the face, lips, or tongue -dizziness -fever -pain, redness, or irritation at site   where injected -pinpoint red spots on the skin -red or dark-brown urine -shortness of breath or breathing problems -stomach or side pain, or pain at the shoulder -swelling -tiredness -trouble passing urine or change in the amount of urine Side  effects that usually do not require medical attention (report to your doctor or health care professional if they continue or are bothersome): -bone pain -muscle pain This list may not describe all possible side effects. Call your doctor for medical advice about side effects. You may report side effects to FDA at 1-800-FDA-1088. Where should I keep my medicine? Keep out of the reach of children. Store pre-filled syringes in a refrigerator between 2 and 8 degrees C (36 and 46 degrees F). Do not freeze. Keep in carton to protect from light. Throw away this medicine if it is left out of the refrigerator for more than 48 hours. Throw away any unused medicine after the expiration date. NOTE: This sheet is a summary. It may not cover all possible information. If you have questions about this medicine, talk to your doctor, pharmacist, or health care provider.  2018 Elsevier/Gold Standard (2016-06-13 12:58:03)  

## 2018-07-23 ENCOUNTER — Other Ambulatory Visit: Payer: Self-pay

## 2018-07-23 ENCOUNTER — Inpatient Hospital Stay: Payer: BLUE CROSS/BLUE SHIELD

## 2018-07-23 VITALS — BP 109/78 | HR 78 | Temp 97.8°F | Resp 16

## 2018-07-23 DIAGNOSIS — Z171 Estrogen receptor negative status [ER-]: Principal | ICD-10-CM

## 2018-07-23 DIAGNOSIS — C50412 Malignant neoplasm of upper-outer quadrant of left female breast: Secondary | ICD-10-CM | POA: Diagnosis not present

## 2018-07-23 LAB — CMP (CANCER CENTER ONLY)
ALT: 57 U/L — ABNORMAL HIGH (ref 0–44)
AST: 34 U/L (ref 15–41)
Albumin: 3.8 g/dL (ref 3.5–5.0)
Alkaline Phosphatase: 93 U/L (ref 38–126)
Anion gap: 8 (ref 5–15)
BUN: 7 mg/dL (ref 6–20)
CO2: 27 mmol/L (ref 22–32)
Calcium: 9.3 mg/dL (ref 8.9–10.3)
Chloride: 106 mmol/L (ref 98–111)
Creatinine: 0.58 mg/dL (ref 0.44–1.00)
GFR, Est AFR Am: >60 mL/min (ref >60)
GFR, Estimated: >60 mL/min (ref >60)
Glucose, Bld: 91 mg/dL (ref 70–99)
Potassium: 4.2 mmol/L (ref 3.5–5.1)
Sodium: 141 mmol/L (ref 135–145)
Total Bilirubin: 0.3 mg/dL (ref 0.3–1.2)
Total Protein: 6.5 g/dL (ref 6.5–8.1)

## 2018-07-23 LAB — CBC WITH DIFFERENTIAL (CANCER CENTER ONLY)
Abs Immature Granulocytes: 0.3 10*3/uL — ABNORMAL HIGH (ref 0.00–0.07)
Basophils Absolute: 0.1 10*3/uL (ref 0.0–0.1)
Basophils Relative: 1 %
Eosinophils Absolute: 0.1 10*3/uL (ref 0.0–0.5)
Eosinophils Relative: 1 %
HCT: 28.5 % — ABNORMAL LOW (ref 36.0–46.0)
Hemoglobin: 9.6 g/dL — ABNORMAL LOW (ref 12.0–15.0)
Immature Granulocytes: 3 %
Lymphocytes Relative: 13 %
Lymphs Abs: 1.5 10*3/uL (ref 0.7–4.0)
MCH: 32.3 pg (ref 26.0–34.0)
MCHC: 33.7 g/dL (ref 30.0–36.0)
MCV: 96 fL (ref 80.0–100.0)
Monocytes Absolute: 0.7 10*3/uL (ref 0.1–1.0)
Monocytes Relative: 7 %
Neutro Abs: 8.4 10*3/uL — ABNORMAL HIGH (ref 1.7–7.7)
Neutrophils Relative %: 75 %
Platelet Count: 329 10*3/uL (ref 150–400)
RBC: 2.97 MIL/uL — ABNORMAL LOW (ref 3.87–5.11)
RDW: 13 % (ref 11.5–15.5)
WBC Count: 11.1 10*3/uL — ABNORMAL HIGH (ref 4.0–10.5)
nRBC: 0 % (ref 0.0–0.2)

## 2018-07-23 MED ORDER — HEPARIN SOD (PORK) LOCK FLUSH 100 UNIT/ML IV SOLN
500.0000 [IU] | Freq: Once | INTRAVENOUS | Status: AC | PRN
  Administered 2018-07-23: 500 [IU]
  Filled 2018-07-23: qty 5

## 2018-07-23 MED ORDER — PACLITAXEL PROTEIN-BOUND CHEMO INJECTION 100 MG
60.0000 mg/m2 | Freq: Once | INTRAVENOUS | Status: AC
  Administered 2018-07-23: 100 mg via INTRAVENOUS
  Filled 2018-07-23: qty 20

## 2018-07-23 MED ORDER — ONDANSETRON HCL 4 MG/2ML IJ SOLN
8.0000 mg | Freq: Once | INTRAMUSCULAR | Status: AC
  Administered 2018-07-23: 8 mg via INTRAVENOUS

## 2018-07-23 MED ORDER — SODIUM CHLORIDE 0.9 % IV SOLN
Freq: Once | INTRAVENOUS | Status: AC
  Administered 2018-07-23: 09:00:00 via INTRAVENOUS
  Filled 2018-07-23: qty 250

## 2018-07-23 MED ORDER — PACLITAXEL PROTEIN-BOUND CHEMO INJECTION 100 MG
60.0000 mg/m2 | Freq: Once | Status: DC
  Filled 2018-07-23: qty 20

## 2018-07-23 MED ORDER — ONDANSETRON HCL 4 MG/2ML IJ SOLN
INTRAMUSCULAR | Status: AC
  Filled 2018-07-23: qty 4

## 2018-07-23 MED ORDER — SODIUM CHLORIDE 0.9% FLUSH
10.0000 mL | INTRAVENOUS | Status: DC | PRN
  Administered 2018-07-23: 10 mL
  Filled 2018-07-23: qty 10

## 2018-07-23 NOTE — Patient Instructions (Signed)
[facility] Discharge Instructions for Patients Receiving Chemotherapy  Today you received the following chemotherapy agents: Paclitaxel-protein bound (Abraxane)  To help prevent nausea and vomiting after your treatment, we encourage you to take your nausea medication as directed.    If you develop nausea and vomiting that is not controlled by your nausea medication, call the clinic.   BELOW ARE SYMPTOMS THAT SHOULD BE REPORTED IMMEDIATELY:  *FEVER GREATER THAN 100.5 F  *CHILLS WITH OR WITHOUT FEVER  NAUSEA AND VOMITING THAT IS NOT CONTROLLED WITH YOUR NAUSEA MEDICATION  *UNUSUAL SHORTNESS OF BREATH  *UNUSUAL BRUISING OR BLEEDING  TENDERNESS IN MOUTH AND THROAT WITH OR WITHOUT PRESENCE OF ULCERS  *URINARY PROBLEMS  *BOWEL PROBLEMS  UNUSUAL RASH Items with * indicate a potential emergency and should be followed up as soon as possible.  Feel free to call the clinic should you have any questions or concerns. The clinic phone number is [phone].  Please show the CHEMO ALERT CARD at check-in to the Emergency Department and triage nurse.   

## 2018-07-28 ENCOUNTER — Inpatient Hospital Stay: Payer: BLUE CROSS/BLUE SHIELD

## 2018-07-28 VITALS — BP 109/62 | HR 87 | Temp 98.5°F | Resp 18

## 2018-07-28 DIAGNOSIS — Z171 Estrogen receptor negative status [ER-]: Principal | ICD-10-CM

## 2018-07-28 DIAGNOSIS — C50412 Malignant neoplasm of upper-outer quadrant of left female breast: Secondary | ICD-10-CM

## 2018-07-28 MED ORDER — TBO-FILGRASTIM 480 MCG/0.8ML ~~LOC~~ SOSY
PREFILLED_SYRINGE | SUBCUTANEOUS | Status: AC
  Filled 2018-07-28: qty 0.8

## 2018-07-28 MED ORDER — TBO-FILGRASTIM 480 MCG/0.8ML ~~LOC~~ SOSY
480.0000 ug | PREFILLED_SYRINGE | Freq: Once | SUBCUTANEOUS | Status: AC
  Administered 2018-07-28: 480 ug via SUBCUTANEOUS

## 2018-07-28 NOTE — Patient Instructions (Signed)
Tbo-Filgrastim injection What is this medicine? TBO-FILGRASTIM (T B O fil GRA stim) is a granulocyte colony-stimulating factor that stimulates the growth of neutrophils, a type of white blood cell important in the body's fight against infection. It is used to reduce the incidence of fever and infection in patients with certain types of cancer who are receiving chemotherapy that affects the bone marrow. This medicine may be used for other purposes; ask your health care provider or pharmacist if you have questions. COMMON BRAND NAME(S): Granix What should I tell my health care provider before I take this medicine? They need to know if you have any of these conditions: -bone scan or tests planned -kidney disease -sickle cell anemia -an unusual or allergic reaction to tbo-filgrastim, filgrastim, pegfilgrastim, other medicines, foods, dyes, or preservatives -pregnant or trying to get pregnant -breast-feeding How should I use this medicine? This medicine is for injection under the skin. If you get this medicine at home, you will be taught how to prepare and give this medicine. Refer to the Instructions for Use that come with your medication packaging. Use exactly as directed. Take your medicine at regular intervals. Do not take your medicine more often than directed. It is important that you put your used needles and syringes in a special sharps container. Do not put them in a trash can. If you do not have a sharps container, call your pharmacist or healthcare provider to get one. Talk to your pediatrician regarding the use of this medicine in children. While this drug may be prescribed for children as young as 1 month of age for selected conditions, precautions do apply. Overdosage: If you think you have taken too much of this medicine contact a poison control center or emergency room at once. NOTE: This medicine is only for you. Do not share this medicine with others. What if I miss a dose? It is  important not to miss your dose. Call your doctor or health care professional if you miss a dose. What may interact with this medicine? This medicine may interact with the following medications: -medicines that may cause a release of neutrophils, such as lithium This list may not describe all possible interactions. Give your health care provider a list of all the medicines, herbs, non-prescription drugs, or dietary supplements you use. Also tell them if you smoke, drink alcohol, or use illegal drugs. Some items may interact with your medicine. What should I watch for while using this medicine? You may need blood work done while you are taking this medicine. What side effects may I notice from receiving this medicine? Side effects that you should report to your doctor or health care professional as soon as possible: -allergic reactions like skin rash, itching or hives, swelling of the face, lips, or tongue -back pain -blood in the urine -dark urine -dizziness -fast heartbeat -feeling faint -shortness of breath or breathing problems -signs and symptoms of infection like fever or chills; cough; or sore throat -signs and symptoms of kidney injury like trouble passing urine or change in the amount of urine -stomach or side pain, or pain at the shoulder -sweating -swelling of the legs, ankles, or abdomen -tiredness Side effects that usually do not require medical attention (report to your doctor or health care professional if they continue or are bothersome): -bone pain -diarrhea -headache -muscle pain -vomiting This list may not describe all possible side effects. Call your doctor for medical advice about side effects. You may report side effects to FDA at   1-800-FDA-1088. Where should I keep my medicine? Keep out of the reach of children. Store in a refrigerator between 2 and 8 degrees C (36 and 46 degrees F). Keep in carton to protect from light. Throw away this medicine if it is left out  of the refrigerator for more than 5 consecutive days. Throw away any unused medicine after the expiration date. NOTE: This sheet is a summary. It may not cover all possible information. If you have questions about this medicine, talk to your doctor, pharmacist, or health care provider.  2019 Elsevier/Gold Standard (2017-02-04 16:56:18)  

## 2018-07-29 ENCOUNTER — Telehealth: Payer: Self-pay | Admitting: Hematology and Oncology

## 2018-07-29 NOTE — Telephone Encounter (Signed)
Scheduled appt per 1/23 sch message - left message for patient with appt date and time

## 2018-07-30 ENCOUNTER — Inpatient Hospital Stay (HOSPITAL_BASED_OUTPATIENT_CLINIC_OR_DEPARTMENT_OTHER): Payer: BLUE CROSS/BLUE SHIELD | Admitting: Adult Health

## 2018-07-30 ENCOUNTER — Inpatient Hospital Stay: Payer: BLUE CROSS/BLUE SHIELD

## 2018-07-30 ENCOUNTER — Encounter: Payer: Self-pay | Admitting: Adult Health

## 2018-07-30 ENCOUNTER — Encounter: Payer: Self-pay | Admitting: *Deleted

## 2018-07-30 VITALS — BP 109/67 | HR 92 | Temp 98.0°F | Resp 18 | Ht 64.0 in | Wt 150.5 lb

## 2018-07-30 DIAGNOSIS — C50412 Malignant neoplasm of upper-outer quadrant of left female breast: Secondary | ICD-10-CM | POA: Diagnosis not present

## 2018-07-30 DIAGNOSIS — Z171 Estrogen receptor negative status [ER-]: Principal | ICD-10-CM

## 2018-07-30 DIAGNOSIS — D701 Agranulocytosis secondary to cancer chemotherapy: Secondary | ICD-10-CM

## 2018-07-30 DIAGNOSIS — Z95828 Presence of other vascular implants and grafts: Secondary | ICD-10-CM

## 2018-07-30 DIAGNOSIS — R197 Diarrhea, unspecified: Secondary | ICD-10-CM

## 2018-07-30 DIAGNOSIS — Z1509 Genetic susceptibility to other malignant neoplasm: Secondary | ICD-10-CM

## 2018-07-30 LAB — CBC WITH DIFFERENTIAL (CANCER CENTER ONLY)
Abs Immature Granulocytes: 0.16 10*3/uL — ABNORMAL HIGH (ref 0.00–0.07)
Basophils Absolute: 0.1 10*3/uL (ref 0.0–0.1)
Basophils Relative: 1 %
Eosinophils Absolute: 0.1 10*3/uL (ref 0.0–0.5)
Eosinophils Relative: 1 %
HCT: 30.4 % — ABNORMAL LOW (ref 36.0–46.0)
Hemoglobin: 10.1 g/dL — ABNORMAL LOW (ref 12.0–15.0)
Immature Granulocytes: 1 %
Lymphocytes Relative: 15 %
Lymphs Abs: 1.7 10*3/uL (ref 0.7–4.0)
MCH: 32.2 pg (ref 26.0–34.0)
MCHC: 33.2 g/dL (ref 30.0–36.0)
MCV: 96.8 fL (ref 80.0–100.0)
Monocytes Absolute: 0.7 10*3/uL (ref 0.1–1.0)
Monocytes Relative: 6 %
Neutro Abs: 8.7 10*3/uL — ABNORMAL HIGH (ref 1.7–7.7)
Neutrophils Relative %: 76 %
Platelet Count: 352 10*3/uL (ref 150–400)
RBC: 3.14 MIL/uL — ABNORMAL LOW (ref 3.87–5.11)
RDW: 13.2 % (ref 11.5–15.5)
WBC Count: 11.3 10*3/uL — ABNORMAL HIGH (ref 4.0–10.5)
nRBC: 0 % (ref 0.0–0.2)

## 2018-07-30 LAB — CMP (CANCER CENTER ONLY)
ALT: 44 U/L (ref 0–44)
AST: 24 U/L (ref 15–41)
Albumin: 3.9 g/dL (ref 3.5–5.0)
Alkaline Phosphatase: 95 U/L (ref 38–126)
Anion gap: 9 (ref 5–15)
BUN: 7 mg/dL (ref 6–20)
CO2: 26 mmol/L (ref 22–32)
Calcium: 9.6 mg/dL (ref 8.9–10.3)
Chloride: 108 mmol/L (ref 98–111)
Creatinine: 0.63 mg/dL (ref 0.44–1.00)
GFR, Est AFR Am: >60 mL/min (ref >60)
GFR, Estimated: >60 mL/min (ref >60)
Glucose, Bld: 97 mg/dL (ref 70–99)
Potassium: 3.8 mmol/L (ref 3.5–5.1)
Sodium: 143 mmol/L (ref 135–145)
Total Bilirubin: <0.2 mg/dL — ABNORMAL LOW (ref 0.3–1.2)
Total Protein: 6.6 g/dL (ref 6.5–8.1)

## 2018-07-30 MED ORDER — SODIUM CHLORIDE 0.9 % IV SOLN
Freq: Once | INTRAVENOUS | Status: AC
  Administered 2018-07-30: 10:00:00 via INTRAVENOUS
  Filled 2018-07-30: qty 250

## 2018-07-30 MED ORDER — SODIUM CHLORIDE 0.9% FLUSH
10.0000 mL | Freq: Once | INTRAVENOUS | Status: AC
  Administered 2018-07-30: 10 mL
  Filled 2018-07-30: qty 10

## 2018-07-30 MED ORDER — HEPARIN SOD (PORK) LOCK FLUSH 100 UNIT/ML IV SOLN
500.0000 [IU] | Freq: Once | INTRAVENOUS | Status: AC | PRN
  Administered 2018-07-30: 500 [IU]
  Filled 2018-07-30: qty 5

## 2018-07-30 MED ORDER — SODIUM CHLORIDE 0.9% FLUSH
10.0000 mL | INTRAVENOUS | Status: DC | PRN
  Administered 2018-07-30: 10 mL
  Filled 2018-07-30: qty 10

## 2018-07-30 MED ORDER — PACLITAXEL PROTEIN-BOUND CHEMO INJECTION 100 MG
60.0000 mg/m2 | Freq: Once | INTRAVENOUS | Status: AC
  Administered 2018-07-30: 100 mg via INTRAVENOUS
  Filled 2018-07-30: qty 20

## 2018-07-30 MED ORDER — ONDANSETRON HCL 4 MG/2ML IJ SOLN
8.0000 mg | Freq: Once | INTRAMUSCULAR | Status: AC
  Administered 2018-07-30: 8 mg via INTRAVENOUS

## 2018-07-30 MED ORDER — ONDANSETRON HCL 4 MG/2ML IJ SOLN
INTRAMUSCULAR | Status: AC
  Filled 2018-07-30: qty 4

## 2018-07-30 NOTE — Progress Notes (Signed)
[facility] Follow up:    Patient, [name] No address on file   DIAGNOSIS: Cancer Staging Malignant neoplasm of upper-outer quadrant of left breast in female, estrogen receptor negative (Samantha Mcneil) Staging form: Breast, AJCC 8th Edition - Clinical: Stage IIB (cT2, cN0, cM0, G3, ER-, PR-, HER2-) - Signed by Nicholas Lose, MD on 03/18/2018   SUMMARY OF ONCOLOGIC HISTORY:   Malignant neoplasm of upper-outer quadrant of left breast in female, estrogen receptor negative (Samantha Mcneil)   03/12/2018 Initial Diagnosis    Palpable masses in both breasts with family history of breast cancer, breast density category D, ultrasound measured these hypoechoic irregular mass left breast 2.3 cm, right breast no abnormality, biopsy left breast mass: IDC grade 3 ER 0%, PR 0%, HER-2 negative, Ki-67 90%, lymph node biopsy benign: T2N0    03/18/2018 Cancer Staging    Staging form: Breast, AJCC 8th Edition - Clinical: Stage IIB (cT2, cN0, cM0, G3, ER-, PR-, HER2-) - Signed by Nicholas Lose, MD on 03/18/2018    03/30/2018 Breast MRI    Single biopsy-proven malignancy in the left 12 o'clock breast measures 2.3 x 1.3 x 1.5 cm.  Right breast without any malignancy detected.    04/02/2018 -  Neo-Adjuvant Chemotherapy    Neoadjuvant chemotherapy with dose dense Adriamycin and Cytoxan followed by Taxol and carboplatin    04/23/2018 Genetic Testing    Positive genetic testing: A pathogenic variant was identified in BRCA2 called [id number] (p.Ala2951Glyfs*26) on the Common Hereditary Cancers Panel. The Common Hereditary Cancers Panel offered by [facility] includes sequencing and/or deletion duplication testing of the following 47 genes: APC, ATM, AXIN2, BARD1, BMPR1A, BRCA1, BRCA2, BRIP1, CDH1, CDKN2A (p14ARF), CDKN2A (p16INK4a), CKD4, CHEK2, CTNNA1, DICER1, EPCAM (Deletion/duplication testing only), GREM1 (promoter region deletion/duplication testing only), KIT, MEN1, MLH1, MSH2, MSH3, MSH6, MUTYH, NBN, NF1,  NHTL1, PALB2, PDGFRA, PMS2, POLD1, POLE, PTEN, RAD50, RAD51C, RAD51D, SDHB, SDHC, SDHD, SMAD4, SMARCA4. STK11, TP53, TSC1, TSC2, and VHL.  The following genes were evaluated for sequence changes only: SDHA and HOXB13 c.251G>A variant only. Genetic testing did detect a Variant of Unknown Significance (VUS) in the PALB2 gene called c.205C>T.  At this time, it is unknown if this variant is associated with increased cancer risk or if this is a normal finding, but most variants such as this get reclassified to being inconsequential. It should not be used to make medical management decisions.   The report date is 04/23/2018.     06/04/2018 -  Chemotherapy    The patient had palonosetron (ALOXI) injection 0.25 mg, 0.25 mg, Intravenous,  Once, 1 of 2 cycles Administration: 0.25 mg (07/16/2018) Tbo-Filgrastim (GRANIX) injection 480 mcg, 480 mcg, Subcutaneous, Once, 1 of 1 cycle Administration: 480 mcg (06/30/2018) ondansetron (ZOFRAN) 8 mg in sodium chloride 0.9 % 50 mL IVPB, 8 mg (100 % of original dose 8 mg), Intravenous,  Once, 1 of 1 cycle Dose modification: 8 mg (original dose 8 mg, Cycle 1) CARBOplatin (PARAPLATIN) 260 mg in sodium chloride 0.9 % 250 mL chemo infusion, 260 mg (100 % of original dose 258.4 mg), Intravenous,  Once, 1 of 2 cycles Dose modification:   (original dose 258.4 mg, Cycle 2),   (original dose 258.4 mg, Cycle 3), 700 mg (original dose 258.4 mg, Cycle 3, Reason: Provider Judgment) Administration: 260 mg (07/16/2018) PACLitaxel-protein bound (ABRAXANE) chemo infusion 125 mg, 80 mg/m2 = 125 mg (80 % of original dose 100 mg/m2), Intravenous, Once, 2 of 3 cycles Dose modification: 80 mg/m2 (original dose 100  mg/m2, Cycle 1, Reason: Provider Judgment), 60 mg/m2 (original dose 100 mg/m2, Cycle 1, Reason: Dose not tolerated) Administration: 125 mg (06/04/2018), 100 mg (06/18/2018), 100 mg (07/02/2018), 100 mg (07/09/2018), 100 mg (07/16/2018), 100 mg (06/25/2018)  for chemotherapy treatment.       CURRENT THERAPY: abraxane/carbo  INTERVAL HISTORY: Samantha Mcneil 33 y.o. female returns for evaluation prior to receiving her neo adjuvant chemotherapy.  She is doing well today.  She is not experiencing any numbness and tingling in her fingertips and toes.  She notes that she does have diarrhea following chemo and will take imodium (she thinks this is the medication, the name isn't listed on her list, and she cannot recall).  This causes constipation, and then she has some bleeding in the toilet.  She notes that she will have diarrhea daily x 4 days, and takes the meds daily and then will subsequently get constipated.     Patient Active Problem List   Diagnosis Date Noted  . BRCA2 gene mutation positive 04/28/2018  . Genetic testing 04/24/2018  . Family history of breast cancer   . Family history of lung cancer   . Family history of liver cancer   . Port-A-Cath in place 04/09/2018  . Malignant neoplasm of upper-outer quadrant of left breast in female, estrogen receptor negative (Samantha Mcneil) 03/18/2018    has No Known Allergies.  MEDICAL HISTORY: Past Medical History:  Diagnosis Date  . Anxiety   . Depression   . Family history of breast cancer   . Family history of liver cancer   . Family history of lung cancer   . Headache    migraines  . History of kidney stones     SURGICAL HISTORY: Past Surgical History:  Procedure Laterality Date  . CESAREAN SECTION    . OTHER SURGICAL HISTORY    . PORTACATH PLACEMENT Left 04/01/2018   Procedure: INSERTION PORT-A-CATH;  Surgeon: [name], MD;  Location: [facility];  Service: General;  Laterality: Left;    SOCIAL HISTORY: Social History   Socioeconomic History  . Marital status: Married    Spouse name: Not on file  . Number of children: Not on file  . Years of education: Not on file  . Highest education level: Not on file  Occupational History  . Not on file  Social Needs  . Financial resource strain: Not  on file  . Food insecurity:    Worry: Not on file    Inability: Not on file  . Transportation needs:    Medical: Not on file    Non-medical: Not on file  Tobacco Use  . Smoking status: Never Smoker  . Smokeless tobacco: Never Used  Substance and Sexual Activity  . Alcohol use: Yes    Comment: Rare  . Drug use: Never  . Sexual activity: Yes    Birth control/protection: None    Comment: 1st intercourse 33 yo-Fewer than 5 partners  Lifestyle  . Physical activity:    Days per week: Not on file    Minutes per session: Not on file  . Stress: Not on file  Relationships  . Social connections:    Talks on phone: Not on file    Gets together: Not on file    Attends religious service: Not on file    Active member of club or organization: Not on file    Attends meetings of clubs or organizations: Not on file    Relationship status: Not on file  . Intimate  partner violence:    Fear of current or ex partner: Not on file    Emotionally abused: Not on file    Physically abused: Not on file    Forced sexual activity: Not on file  Other Topics Concern  . Not on file  Social History Narrative  . Not on file    FAMILY HISTORY: Family History  Problem Relation Age of Onset  . Heart disease Father        d. 75  . Breast cancer Maternal Aunt        d. 65  . Breast cancer Cousin        dx 62  . Liver cancer Maternal Grandmother        d. 43  . Lung cancer Maternal Grandfather        d. 31  . Breast cancer Cousin        dx 60s    Review of Systems  Constitutional: Negative for appetite change, chills, fatigue and unexpected weight change.  HENT:   Negative for hearing loss and lump/mass.   Eyes: Negative for eye problems and icterus.  Respiratory: Negative for chest tightness, cough and shortness of breath.   Cardiovascular: Negative for chest pain, leg swelling and palpitations.  Gastrointestinal: Positive for blood in stool, constipation and diarrhea. Negative for abdominal  distention, abdominal pain, nausea and vomiting.  Endocrine: Negative for hot flashes.  Skin: Negative for itching and rash.  Neurological: Negative for dizziness, extremity weakness, headaches and numbness.  Hematological: Negative for adenopathy. Does not bruise/bleed easily.  Psychiatric/Behavioral: Negative for depression. The patient is not nervous/anxious.       PHYSICAL EXAMINATION  ECOG PERFORMANCE STATUS: 1 - Symptomatic but completely ambulatory  Vitals:   07/30/18 0858  BP: 109/67  Pulse: 92  Resp: 18  Temp: 98 F (36.7 C)  SpO2: 98%    Physical Exam Constitutional:      Appearance: Normal appearance.  HENT:     Head: Normocephalic and atraumatic.     Nose: No congestion.     Mouth/Throat:     Mouth: Mucous membranes are dry.  Eyes:     General: No scleral icterus.    Pupils: Pupils are equal, round, and reactive to light.  Neck:     Musculoskeletal: Neck supple.  Cardiovascular:     Rate and Rhythm: Normal rate and regular rhythm.     Pulses: Normal pulses.  Pulmonary:     Effort: Pulmonary effort is normal.     Breath sounds: Normal breath sounds.     Comments: Unable to palpate left breast mass Abdominal:     General: Abdomen is flat. Bowel sounds are normal.     Palpations: Abdomen is soft.  Musculoskeletal:        General: No swelling.  Lymphadenopathy:     Cervical: No cervical adenopathy.  Skin:    General: Skin is warm and dry.     Capillary Refill: Capillary refill takes less than 2 seconds.     Findings: No rash.  Neurological:     General: No focal deficit present.     Mental Status: She is alert.  Psychiatric:        Mood and Affect: Mood normal.        Behavior: Behavior normal.     LABORATORY DATA:  CBC    Component Value Date/Time   WBC 11.3 (H) 07/30/2018 0825   WBC 5.3 03/03/2018 1114   RBC 3.14 (L) 07/30/2018 0825  HGB 10.1 (L) 07/30/2018 0825   HCT 30.4 (L) 07/30/2018 0825   PLT 352 07/30/2018 0825   MCV 96.8  07/30/2018 0825   MCH 32.2 07/30/2018 0825   MCHC 33.2 07/30/2018 0825   RDW 13.2 07/30/2018 0825   LYMPHSABS 1.7 07/30/2018 0825   MONOABS 0.7 07/30/2018 0825   EOSABS 0.1 07/30/2018 0825   BASOSABS 0.1 07/30/2018 0825    CMP     Component Value Date/Time   NA 143 07/30/2018 0825   K 3.8 07/30/2018 0825   CL 108 07/30/2018 0825   CO2 26 07/30/2018 0825   GLUCOSE 97 07/30/2018 0825   BUN 7 07/30/2018 0825   CREATININE 0.63 07/30/2018 0825   CREATININE 0.63 03/03/2018 1114   CALCIUM 9.6 07/30/2018 0825   PROT 6.6 07/30/2018 0825   ALBUMIN 3.9 07/30/2018 0825   AST 24 07/30/2018 0825   ALT 44 07/30/2018 0825   ALKPHOS 95 07/30/2018 0825   BILITOT <0.2 (L) 07/30/2018 0825   GFRNONAA >60 07/30/2018 0825   GFRAA >60 07/30/2018 0825          ASSESSMENT and THERAPY PLAN:   Malignant neoplasm of upper-outer quadrant of left breast in female, estrogen receptor negative (Samantha Mcneil) 03/12/2018:Palpable masses in both breasts with family history of breast cancer, breast density category D, ultrasound measured these hypoechoic irregular mass left breast 2.3 cm, right breast no abnormality, biopsy left breast mass: IDC grade 3 ER 0%, PR 0%, HER-2 negative, Ki-67 90%, lymph node biopsy benign: T2N0, stage IIb  Recommendationbased on multidisciplinary tumor board: Genetics consultation 1. Neoadjuvant chemotherapy with Adriamycin and Cytoxan dose dense 4 followed byTaxol with carboplatinweekly 12 2. Followed by breast conserving surgery with sentinel lymph node study vs targeted axillary dissection 3. Followed by adjuvant radiation therapy  BRCA2 mutation: Patient contemplating on bilateral mastectomies with reconstruction Oophorectomy after the age of 41 ---------------------------------------------------------------------------------------------------------------- Current Treatment:Neoadjuvant chemotherapywith dose dense Adriamycin and Cytoxanx4 completed. Taxol started  05/20/2018, developed severe reaction, switchedto Abraxane from cycle 2, today is cycle Carboplatin added for next week  Today is #8  Taxol related rash: Resolved Chemo toxicities: 1.  Neutropenia: on Granix injections prior to each chemotherapy and this is helped keep her neutrophil count better. 2.  Fatigue 3. Diarrhea/Constipation/blood in stool: recommended she take the anti diarrhea medication daily x 3 days instead of four and hopefully that will improve her constipation and subsequent blood in her stool.    She has no peripheral neuroapthy at this point.  I ordered her post neoadjuvant chemotherapy MRI.    Return to clinic weekly for chemotherapy and every 2 weeks for follow-up with Dr. Lindi Adie.   Orders Placed This Encounter  Procedures  . MR BREAST BILATERAL W WO CONTRAST INC CAD    Standing Status:   Future    Standing Expiration Date:   09/28/2019    Order Specific Question:   If indicated for the ordered procedure, I authorize the administration of contrast media per Radiology protocol    Answer:   Yes    Order Specific Question:   What is the patient's sedation requirement?    Answer:   No Sedation    Order Specific Question:   Does the patient have a pacemaker or implanted devices?    Answer:   No    Order Specific Question:   Radiology Contrast Protocol - do NOT remove file path    Answer:   [email or web address]    Order Specific Question:   Preferred imaging location?  Answer:   [facility] (table limit-350 lbs)    All questions were answered. The patient knows to call the clinic with any problems, questions or concerns. We can certainly see the patient much sooner if necessary.  A total of (30) minutes of face-to-face time was spent with this patient with greater than 50% of that time in counseling and care-coordination.  This note was electronically signed. Scot Dock, NP 07/30/2018

## 2018-07-30 NOTE — Patient Instructions (Signed)
[place] Discharge Instructions for Patients Receiving Chemotherapy  Today you received the following chemotherapy agents Paclitaxel-Protein (ABRAXANE).  To help prevent nausea and vomiting after your treatment, we encourage you to take your nausea medication as prescribed.   If you develop nausea and vomiting that is not controlled by your nausea medication, call the clinic.   BELOW ARE SYMPTOMS THAT SHOULD BE REPORTED IMMEDIATELY:  *FEVER GREATER THAN 100.5 F  *CHILLS WITH OR WITHOUT FEVER  NAUSEA AND VOMITING THAT IS NOT CONTROLLED WITH YOUR NAUSEA MEDICATION  *UNUSUAL SHORTNESS OF BREATH  *UNUSUAL BRUISING OR BLEEDING  TENDERNESS IN MOUTH AND THROAT WITH OR WITHOUT PRESENCE OF ULCERS  *URINARY PROBLEMS  *BOWEL PROBLEMS  UNUSUAL RASH Items with * indicate a potential emergency and should be followed up as soon as possible.  Feel free to call the clinic should you have any questions or concerns. The clinic phone number is [phone].  Please show the [facility] at check-in to the Emergency Department and triage nurse.

## 2018-07-30 NOTE — Assessment & Plan Note (Addendum)
03/12/2018:Palpable masses in both breasts with family history of breast cancer, breast density category D, ultrasound measured these hypoechoic irregular mass left breast 2.3 cm, right breast no abnormality, biopsy left breast mass: IDC grade 3 ER 0%, PR 0%, HER-2 negative, Ki-67 90%, lymph node biopsy benign: T2N0, stage IIb  Recommendationbased on multidisciplinary tumor board: Genetics consultation 1. Neoadjuvant chemotherapy with Adriamycin and Cytoxan dose dense 4 followed byTaxol with carboplatinweekly 12 2. Followed by breast conserving surgery with sentinel lymph node study vs targeted axillary dissection 3. Followed by adjuvant radiation therapy  BRCA2 mutation: Patient contemplating on bilateral mastectomies with reconstruction Oophorectomy after the age of 66 ---------------------------------------------------------------------------------------------------------------- Current Treatment:Neoadjuvant chemotherapywith dose dense Adriamycin and Cytoxanx4 completed. Taxol started 05/20/2018, developed severe reaction, switchedto Abraxane from cycle 2, today is cycle Carboplatin added for next week  Today is #8  Taxol related rash: Resolved Chemo toxicities: 1.  Neutropenia: on Granix injections prior to each chemotherapy and this is helped keep her neutrophil count better. 2.  Fatigue 3. Diarrhea/Constipation/blood in stool: recommended she take the anti diarrhea medication daily x 3 days instead of four and hopefully that will improve her constipation and subsequent blood in her stool.    She has no peripheral neuroapthy at this point.  I ordered her post neoadjuvant chemotherapy MRI.    Return to clinic weekly for chemotherapy and every 2 weeks for follow-up with Dr. Lindi Adie.

## 2018-08-04 ENCOUNTER — Telehealth: Payer: Self-pay

## 2018-08-04 ENCOUNTER — Inpatient Hospital Stay: Payer: BLUE CROSS/BLUE SHIELD | Attending: Hematology and Oncology

## 2018-08-04 ENCOUNTER — Other Ambulatory Visit: Payer: Self-pay | Admitting: Hematology and Oncology

## 2018-08-04 ENCOUNTER — Telehealth: Payer: Self-pay | Admitting: Hematology and Oncology

## 2018-08-04 VITALS — BP 113/71 | HR 94 | Temp 98.8°F | Resp 18

## 2018-08-04 DIAGNOSIS — Z1502 Genetic susceptibility to malignant neoplasm of ovary: Secondary | ICD-10-CM | POA: Diagnosis not present

## 2018-08-04 DIAGNOSIS — M549 Dorsalgia, unspecified: Secondary | ICD-10-CM | POA: Diagnosis not present

## 2018-08-04 DIAGNOSIS — Z5111 Encounter for antineoplastic chemotherapy: Secondary | ICD-10-CM | POA: Insufficient documentation

## 2018-08-04 DIAGNOSIS — Z803 Family history of malignant neoplasm of breast: Secondary | ICD-10-CM | POA: Insufficient documentation

## 2018-08-04 DIAGNOSIS — D701 Agranulocytosis secondary to cancer chemotherapy: Secondary | ICD-10-CM | POA: Diagnosis present

## 2018-08-04 DIAGNOSIS — C50412 Malignant neoplasm of upper-outer quadrant of left female breast: Secondary | ICD-10-CM | POA: Diagnosis present

## 2018-08-04 DIAGNOSIS — Z171 Estrogen receptor negative status [ER-]: Secondary | ICD-10-CM | POA: Diagnosis not present

## 2018-08-04 DIAGNOSIS — R002 Palpitations: Secondary | ICD-10-CM | POA: Diagnosis not present

## 2018-08-04 DIAGNOSIS — Z1501 Genetic susceptibility to malignant neoplasm of breast: Secondary | ICD-10-CM | POA: Insufficient documentation

## 2018-08-04 MED ORDER — TBO-FILGRASTIM 480 MCG/0.8ML ~~LOC~~ SOSY
480.0000 ug | PREFILLED_SYRINGE | Freq: Once | SUBCUTANEOUS | Status: AC
  Administered 2018-08-04: 480 ug via SUBCUTANEOUS

## 2018-08-04 MED ORDER — TBO-FILGRASTIM 480 MCG/0.8ML ~~LOC~~ SOSY
PREFILLED_SYRINGE | SUBCUTANEOUS | Status: AC
  Filled 2018-08-04: qty 0.8

## 2018-08-04 NOTE — Telephone Encounter (Signed)
Spoke with pt regarding clarification of her chemotherapy. Pt would like to know if she is supposed to get her carboplatin soon. Pt is scheduled to get [place] this week. She was under the impression that her Norma Fredrickson is every 2 weeks. Told pt that it is normally given every 3 weeks. Pt states that she wants to make sure that she is understanding her regimen correctly and would like to have Dr.Gudena discuss this with her. Pt to come in on 08/06/2018 to have another infusion appt. Will add an MD appt per pt request. Ok with Dr.Gudena. Pt appreciative of time and call.

## 2018-08-04 NOTE — Patient Instructions (Signed)
Tbo-Filgrastim injection What is this medicine? TBO-FILGRASTIM (T B O fil GRA stim) is a granulocyte colony-stimulating factor that stimulates the growth of neutrophils, a type of white blood cell important in the body's fight against infection. It is used to reduce the incidence of fever and infection in patients with certain types of cancer who are receiving chemotherapy that affects the bone marrow. This medicine may be used for other purposes; ask your health care provider or pharmacist if you have questions. COMMON BRAND NAME(S): Granix What should I tell my health care provider before I take this medicine? They need to know if you have any of these conditions: -bone scan or tests planned -kidney disease -sickle cell anemia -an unusual or allergic reaction to tbo-filgrastim, filgrastim, pegfilgrastim, other medicines, foods, dyes, or preservatives -pregnant or trying to get pregnant -breast-feeding How should I use this medicine? This medicine is for injection under the skin. If you get this medicine at home, you will be taught how to prepare and give this medicine. Refer to the Instructions for Use that come with your medication packaging. Use exactly as directed. Take your medicine at regular intervals. Do not take your medicine more often than directed. It is important that you put your used needles and syringes in a special sharps container. Do not put them in a trash can. If you do not have a sharps container, call your pharmacist or healthcare provider to get one. Talk to your pediatrician regarding the use of this medicine in children. While this drug may be prescribed for children as young as 1 month of age for selected conditions, precautions do apply. Overdosage: If you think you have taken too much of this medicine contact a poison control center or emergency room at once. NOTE: This medicine is only for you. Do not share this medicine with others. What if I miss a dose? It is  important not to miss your dose. Call your doctor or health care professional if you miss a dose. What may interact with this medicine? This medicine may interact with the following medications: -medicines that may cause a release of neutrophils, such as lithium This list may not describe all possible interactions. Give your health care provider a list of all the medicines, herbs, non-prescription drugs, or dietary supplements you use. Also tell them if you smoke, drink alcohol, or use illegal drugs. Some items may interact with your medicine. What should I watch for while using this medicine? You may need blood work done while you are taking this medicine. What side effects may I notice from receiving this medicine? Side effects that you should report to your doctor or health care professional as soon as possible: -allergic reactions like skin rash, itching or hives, swelling of the face, lips, or tongue -back pain -blood in the urine -dark urine -dizziness -fast heartbeat -feeling faint -shortness of breath or breathing problems -signs and symptoms of infection like fever or chills; cough; or sore throat -signs and symptoms of kidney injury like trouble passing urine or change in the amount of urine -stomach or side pain, or pain at the shoulder -sweating -swelling of the legs, ankles, or abdomen -tiredness Side effects that usually do not require medical attention (report to your doctor or health care professional if they continue or are bothersome): -bone pain -diarrhea -headache -muscle pain -vomiting This list may not describe all possible side effects. Call your doctor for medical advice about side effects. You may report side effects to FDA at   1-800-FDA-1088. Where should I keep my medicine? Keep out of the reach of children. Store in a refrigerator between 2 and 8 degrees C (36 and 46 degrees F). Keep in carton to protect from light. Throw away this medicine if it is left out  of the refrigerator for more than 5 consecutive days. Throw away any unused medicine after the expiration date. NOTE: This sheet is a summary. It may not cover all possible information. If you have questions about this medicine, talk to your doctor, pharmacist, or health care provider.  2019 Elsevier/Gold Standard (2017-02-04 16:56:18)  

## 2018-08-04 NOTE — Telephone Encounter (Signed)
Patient came in to get her injection appt added.

## 2018-08-05 NOTE — Progress Notes (Signed)
Patient Care Team: Patient, [name] as PCP - General (General Practice)  DIAGNOSIS:    ICD-10-CM   1. Malignant neoplasm of upper-outer quadrant of left breast in female, estrogen receptor negative ([place]) C50.412    Z17.1     SUMMARY OF ONCOLOGIC HISTORY:   Malignant neoplasm of upper-outer quadrant of left breast in female, estrogen receptor negative ([place])   03/12/2018 Initial Diagnosis    Palpable masses in both breasts with family history of breast cancer, breast density category D, ultrasound measured these hypoechoic irregular mass left breast 2.3 cm, right breast no abnormality, biopsy left breast mass: IDC grade 3 ER 0%, PR 0%, HER-2 negative, Ki-67 90%, lymph node biopsy benign: T2N0    03/18/2018 Cancer Staging    Staging form: Breast, AJCC 8th Edition - Clinical: Stage IIB (cT2, cN0, cM0, G3, ER-, PR-, HER2-) - Signed by Nicholas Lose, MD on 03/18/2018    03/30/2018 Breast MRI    Single biopsy-proven malignancy in the left 12 o'clock breast measures 2.3 x 1.3 x 1.5 cm.  Right breast without any malignancy detected.    04/02/2018 -  Neo-Adjuvant Chemotherapy    Neoadjuvant chemotherapy with dose dense Adriamycin and Cytoxan followed by Taxol and carboplatin    04/23/2018 Genetic Testing    Positive genetic testing: A pathogenic variant was identified in BRCA2 called [id number] (p.Ala2951Glyfs*26) on the Common Hereditary Cancers Panel. The Common Hereditary Cancers Panel offered by [facility] includes sequencing and/or deletion duplication testing of the following 47 genes: APC, ATM, AXIN2, BARD1, BMPR1A, BRCA1, BRCA2, BRIP1, CDH1, CDKN2A (p14ARF), CDKN2A (p16INK4a), CKD4, CHEK2, CTNNA1, DICER1, EPCAM (Deletion/duplication testing only), GREM1 (promoter region deletion/duplication testing only), KIT, MEN1, MLH1, MSH2, MSH3, MSH6, MUTYH, NBN, NF1, NHTL1, PALB2, PDGFRA, PMS2, POLD1, POLE, PTEN, RAD50, RAD51C, RAD51D, SDHB, SDHC, SDHD, SMAD4, SMARCA4. STK11, TP53, TSC1, TSC2, and  VHL.  The following genes were evaluated for sequence changes only: SDHA and HOXB13 c.251G>A variant only. Genetic testing did detect a Variant of Unknown Significance (VUS) in the PALB2 gene called c.205C>T.  At this time, it is unknown if this variant is associated with increased cancer risk or if this is a normal finding, but most variants such as this get reclassified to being inconsequential. It should not be used to make medical management decisions.   The report date is 04/23/2018.     06/04/2018 -  Chemotherapy    The patient had palonosetron (ALOXI) injection 0.25 mg, 0.25 mg, Intravenous,  Once, 2 of 2 cycles Administration: 0.25 mg (07/16/2018) Tbo-Filgrastim (GRANIX) injection 480 mcg, 480 mcg, Subcutaneous, Once, 1 of 1 cycle Administration: 480 mcg (06/30/2018) ondansetron (ZOFRAN) 8 mg in sodium chloride 0.9 % 50 mL IVPB, 8 mg (100 % of original dose 8 mg), Intravenous,  Once, 1 of 1 cycle Dose modification: 8 mg (original dose 8 mg, Cycle 1) CARBOplatin (PARAPLATIN) 260 mg in sodium chloride 0.9 % 250 mL chemo infusion, 260 mg (100 % of original dose 258.4 mg), Intravenous,  Once, 2 of 2 cycles Dose modification:   (original dose 258.4 mg, Cycle 2),   (original dose 258.4 mg, Cycle 3), 700 mg (original dose 258.4 mg, Cycle 3, Reason: Provider Judgment) Administration: 260 mg (07/16/2018) fosaprepitant (EMEND) 150 mg, dexamethasone (DECADRON) 12 mg in sodium chloride 0.9 % 145 mL IVPB, , Intravenous,  Once, 1 of 1 cycle PACLitaxel-protein bound (ABRAXANE) chemo infusion 125 mg, 80 mg/m2 = 125 mg (80 % of original dose 100 mg/m2), Intravenous, Once, 3 of 3 cycles Dose  modification: 80 mg/m2 (original dose 100 mg/m2, Cycle 1, Reason: Provider Judgment), 60 mg/m2 (original dose 100 mg/m2, Cycle 1, Reason: Dose not tolerated) Administration: 125 mg (06/04/2018), 100 mg (06/18/2018), 100 mg (07/02/2018), 100 mg (07/09/2018), 100 mg (07/16/2018), 100 mg (07/30/2018), 100 mg (06/25/2018)  for  chemotherapy treatment.      CHIEF COMPLIANT: Cycle 9 Abraxane and Carboplatin  INTERVAL HISTORY: Samantha Mcneil is a 33 y.o. with above-mentioned history of left breast cancer currently on neoadjuvant chemotherapy. She completed 4 cycles of dose dense Adriamycin andCytoxanand is here today forcycle9ofAbraxane and Carboplatin. She presents to the clinic today with her aunt. She denies any neuropathy or bone pain. Her back pain has improved. Her labs from today show: WBC 11.2, platelets 269, Hg 10.3, ANC 8.4. She agreed to get her last treatment on 08/26/18 and will get a breast MRI on 08/27/18.   R/EVIEW OF SYSTEMS:   Constitutional: Denies fevers, chills or abnormal weight loss Eyes: Denies blurriness of vision Ears, nose, mouth, throat, and face: Denies mucositis or sore throat Respiratory: Denies cough, dyspnea or wheezes Cardiovascular: Denies palpitation, chest discomfort Gastrointestinal: Denies nausea, heartburn or change in bowel habits Skin: Denies abnormal skin rashes MSK: (+) mild lower back pain, improving Lymphatics: Denies new lymphadenopathy or easy bruising Neurological: Denies numbness, tingling or new weaknesses Behavioral/Psych: Mood is stable, no new changes  Extremities: No lower extremity edema Breast: denies any pain or lumps or nodules in either breasts All other systems were reviewed with the patient and are negative.  I have reviewed the past medical history, past surgical history, social history and family history with the patient and they are unchanged from previous note.  ALLERGIES:  has No Known Allergies.  MEDICATIONS:  Current Outpatient Medications  Medication Sig Dispense Refill  . ibuprofen (ADVIL,MOTRIN) 200 MG tablet Take 200 mg by mouth every 6 (six) hours as needed.    Marland Kitchen levofloxacin (LEVAQUIN) 500 MG tablet Take 1 tablet (500 mg total) by mouth daily. 7 tablet 0  . lidocaine-prilocaine (EMLA) cream Apply to affected area once 30 g 1  .  magic mouthwash SOLN Take 5 mLs by mouth 4 (four) times daily as needed for mouth pain. 240 mL 0  . triamcinolone (KENALOG) 0.025 % ointment Apply 1 application topically 2 (two) times daily. 30 g 1  . venlafaxine XR (EFFEXOR-XR) 37.5 MG 24 hr capsule Take 1 capsule (37.5 mg total) by mouth daily with breakfast. 30 capsule 6   No current facility-administered medications for this visit.    Facility-Administered Medications Ordered in Other Visits  Medication Dose Route Frequency Provider Last Rate Last Dose  . CARBOplatin (PARAPLATIN) 700 mg in sodium chloride 0.9 % 250 mL chemo infusion  700 mg Intravenous Once Nicholas Lose, MD      . fosaprepitant (EMEND) 150 mg, dexamethasone (DECADRON) 12 mg in sodium chloride 0.9 % 145 mL IVPB   Intravenous Once Nicholas Lose, MD      . heparin lock flush 100 unit/mL  500 Units Intracatheter Once PRN Nicholas Lose, MD      . PACLitaxel-protein bound (ABRAXANE) chemo infusion 100 mg  60 mg/m2 (Treatment Plan Recorded) Intravenous Once Nicholas Lose, MD      . sodium chloride flush (NS) 0.9 % injection 10 mL  10 mL Intracatheter PRN Nicholas Lose, MD        PHYSICAL EXAMINATION: ECOG PERFORMANCE STATUS: 1 - Symptomatic but completely ambulatory  Vitals:   08/06/18 0924  BP: 108/74  Pulse: 76  Resp: 20  Temp: 98.4 F (36.9 C)  SpO2: 100%   Filed Weights   08/06/18 0924  Weight: 151 lb 12.8 oz (68.9 kg)    GENERAL: alert, no distress and comfortable SKIN: skin color, texture, turgor are normal, no rashes or significant lesions EYES: normal, Conjunctiva are pink and non-injected, sclera clear OROPHARYNX: no exudate, no erythema and lips, buccal mucosa, and tongue normal  NECK: supple, thyroid normal size, non-tender, without nodularity LYMPH: no palpable lymphadenopathy in the cervical, axillary or inguinal LUNGS: clear to auscultation and percussion with normal breathing effort HEART: regular rate & rhythm and no murmurs and no lower  extremity edema ABDOMEN: abdomen soft, non-tender and normal bowel sounds MUSCULOSKELETAL: no cyanosis of digits and no clubbing  NEURO: alert & oriented x 3 with fluent speech, no focal motor/sensory deficits EXTREMITIES: No lower extremity edema  LABORATORY DATA:  I have reviewed the data as listed CMP Latest Ref Rng & Units 08/06/2018 07/30/2018 07/23/2018  Glucose 70 - 99 mg/dL 91 97 91  BUN 6 - 20 mg/dL '12 7 7  ' Creatinine 0.44 - 1.00 mg/dL 0.63 0.63 0.58  Sodium 135 - 145 mmol/L 142 143 141  Potassium 3.5 - 5.1 mmol/L 4.1 3.8 4.2  Chloride 98 - 111 mmol/L 108 108 106  CO2 22 - 32 mmol/L '26 26 27  ' Calcium 8.9 - 10.3 mg/dL 9.5 9.6 9.3  Total Protein 6.5 - 8.1 g/dL 6.7 6.6 6.5  Total Bilirubin 0.3 - 1.2 mg/dL 0.4 <0.2(L) 0.3  Alkaline Phos 38 - 126 U/L 90 95 93  AST 15 - 41 U/L 21 24 34  ALT 0 - 44 U/L 32 44 57(H)    Lab Results  Component Value Date   WBC 11.2 (H) 08/06/2018   HGB 10.3 (L) 08/06/2018   HCT 31.1 (L) 08/06/2018   MCV 96.0 08/06/2018   PLT 269 08/06/2018   NEUTROABS 8.4 (H) 08/06/2018    ASSESSMENT & PLAN:  Malignant neoplasm of upper-outer quadrant of left breast in female, estrogen receptor negative ([place]) 03/12/2018:Palpable masses in both breasts with family history of breast cancer, breast density category D, ultrasound measured these hypoechoic irregular mass left breast 2.3 cm, right breast no abnormality, biopsy left breast mass: IDC grade 3 ER 0%, PR 0%, HER-2 negative, Ki-67 90%, lymph node biopsy benign: T2N0, stage IIb  Recommendationbased on multidisciplinary tumor board: Genetics consultation 1. Neoadjuvant chemotherapy with Adriamycin and Cytoxan dose dense 4 followed byTaxol with carboplatinweekly 12 2. Followed by breast conserving surgery with sentinel lymph node study vs targeted axillary dissection 3. Followed by adjuvant radiation therapy  BRCA2 mutation: Patient contemplating on bilateral mastectomies with  reconstruction Oophorectomy after the age of 71 ---------------------------------------------------------------------------------------------------------------- Current Treatment:Neoadjuvant chemotherapywith dose dense Adriamycin and Cytoxanx4 completed. Taxol started 05/20/2018, developed severe reaction, switchedto Abraxane from cycle 2, today is cycleCarboplatin added for next week  Today is #10   Chemo toxicities: 1.Neutropenia: on Granix injections prior to each chemotherapy and this is helped keep her neutrophil count better. 2.Fatigue 3. Diarrhea/Constipation/blood in stool:    I discussed with the patient that we missed giving carboplatin dose when her treatment got switched to Abraxane.  Today will be the last of the carboplatin treatments.  She has 2 more of weekly Abraxane left.  She has no peripheral neuroapthy at this point.    No orders of the defined types were placed in this encounter.  The patient has a good understanding of the overall plan. she agrees with it. she  will call with any problems that may develop before the next visit here.  Nicholas Lose, MD 08/06/2018  Julious Oka Dorshimer am acting as scribe for Dr. Nicholas Lose.  I have reviewed the above documentation for accuracy and completeness, and I agree with the above.

## 2018-08-06 ENCOUNTER — Inpatient Hospital Stay: Payer: BLUE CROSS/BLUE SHIELD

## 2018-08-06 ENCOUNTER — Encounter: Payer: Self-pay | Admitting: *Deleted

## 2018-08-06 ENCOUNTER — Inpatient Hospital Stay (HOSPITAL_BASED_OUTPATIENT_CLINIC_OR_DEPARTMENT_OTHER): Payer: BLUE CROSS/BLUE SHIELD | Admitting: Hematology and Oncology

## 2018-08-06 ENCOUNTER — Telehealth: Payer: Self-pay | Admitting: Hematology and Oncology

## 2018-08-06 DIAGNOSIS — Z171 Estrogen receptor negative status [ER-]: Secondary | ICD-10-CM

## 2018-08-06 DIAGNOSIS — C50412 Malignant neoplasm of upper-outer quadrant of left female breast: Secondary | ICD-10-CM

## 2018-08-06 DIAGNOSIS — D701 Agranulocytosis secondary to cancer chemotherapy: Secondary | ICD-10-CM | POA: Diagnosis not present

## 2018-08-06 DIAGNOSIS — Z1509 Genetic susceptibility to other malignant neoplasm: Secondary | ICD-10-CM

## 2018-08-06 DIAGNOSIS — R5383 Other fatigue: Secondary | ICD-10-CM

## 2018-08-06 DIAGNOSIS — Z95828 Presence of other vascular implants and grafts: Secondary | ICD-10-CM

## 2018-08-06 LAB — CBC WITH DIFFERENTIAL (CANCER CENTER ONLY)
Abs Immature Granulocytes: 0.32 10*3/uL — ABNORMAL HIGH (ref 0.00–0.07)
Basophils Absolute: 0.1 10*3/uL (ref 0.0–0.1)
Basophils Relative: 1 %
Eosinophils Absolute: 0.2 10*3/uL (ref 0.0–0.5)
Eosinophils Relative: 1 %
HCT: 31.1 % — ABNORMAL LOW (ref 36.0–46.0)
Hemoglobin: 10.3 g/dL — ABNORMAL LOW (ref 12.0–15.0)
Immature Granulocytes: 3 %
Lymphocytes Relative: 14 %
Lymphs Abs: 1.5 10*3/uL (ref 0.7–4.0)
MCH: 31.8 pg (ref 26.0–34.0)
MCHC: 33.1 g/dL (ref 30.0–36.0)
MCV: 96 fL (ref 80.0–100.0)
Monocytes Absolute: 0.8 10*3/uL (ref 0.1–1.0)
Monocytes Relative: 7 %
Neutro Abs: 8.4 10*3/uL — ABNORMAL HIGH (ref 1.7–7.7)
Neutrophils Relative %: 74 %
Platelet Count: 269 10*3/uL (ref 150–400)
RBC: 3.24 MIL/uL — ABNORMAL LOW (ref 3.87–5.11)
RDW: 12.6 % (ref 11.5–15.5)
WBC Count: 11.2 10*3/uL — ABNORMAL HIGH (ref 4.0–10.5)
nRBC: 0 % (ref 0.0–0.2)

## 2018-08-06 LAB — CMP (CANCER CENTER ONLY)
ALT: 32 U/L (ref 0–44)
AST: 21 U/L (ref 15–41)
Albumin: 4 g/dL (ref 3.5–5.0)
Alkaline Phosphatase: 90 U/L (ref 38–126)
Anion gap: 8 (ref 5–15)
BUN: 12 mg/dL (ref 6–20)
CO2: 26 mmol/L (ref 22–32)
Calcium: 9.5 mg/dL (ref 8.9–10.3)
Chloride: 108 mmol/L (ref 98–111)
Creatinine: 0.63 mg/dL (ref 0.44–1.00)
GFR, Est AFR Am: >60 mL/min (ref >60)
GFR, Estimated: >60 mL/min (ref >60)
Glucose, Bld: 91 mg/dL (ref 70–99)
Potassium: 4.1 mmol/L (ref 3.5–5.1)
Sodium: 142 mmol/L (ref 135–145)
Total Bilirubin: 0.4 mg/dL (ref 0.3–1.2)
Total Protein: 6.7 g/dL (ref 6.5–8.1)

## 2018-08-06 MED ORDER — SODIUM CHLORIDE 0.9% FLUSH
10.0000 mL | INTRAVENOUS | Status: DC | PRN
  Administered 2018-08-06: 10 mL
  Filled 2018-08-06: qty 10

## 2018-08-06 MED ORDER — PACLITAXEL PROTEIN-BOUND CHEMO INJECTION 100 MG
60.0000 mg/m2 | Freq: Once | INTRAVENOUS | Status: AC
  Administered 2018-08-06: 100 mg via INTRAVENOUS
  Filled 2018-08-06: qty 20

## 2018-08-06 MED ORDER — DEXAMETHASONE SODIUM PHOSPHATE 10 MG/ML IJ SOLN: 10.0000 mg | Freq: Once | INTRAMUSCULAR | Status: DC

## 2018-08-06 MED ORDER — SODIUM CHLORIDE 0.9 % IV SOLN
Freq: Once | INTRAVENOUS | Status: AC
  Administered 2018-08-06: 10:00:00 via INTRAVENOUS
  Filled 2018-08-06: qty 250

## 2018-08-06 MED ORDER — SODIUM CHLORIDE 0.9% FLUSH
10.0000 mL | Freq: Once | INTRAVENOUS | Status: AC
  Administered 2018-08-06: 10 mL
  Filled 2018-08-06: qty 10

## 2018-08-06 MED ORDER — HEPARIN SOD (PORK) LOCK FLUSH 100 UNIT/ML IV SOLN
500.0000 [IU] | Freq: Once | INTRAVENOUS | Status: AC | PRN
  Administered 2018-08-06: 500 [IU]
  Filled 2018-08-06: qty 5

## 2018-08-06 MED ORDER — SODIUM CHLORIDE 0.9 % IV SOLN
Freq: Once | INTRAVENOUS | Status: AC
  Administered 2018-08-06: 11:00:00 via INTRAVENOUS
  Filled 2018-08-06: qty 5

## 2018-08-06 MED ORDER — SODIUM CHLORIDE 0.9 % IV SOLN
700.0000 mg | Freq: Once | INTRAVENOUS | Status: AC
  Administered 2018-08-06: 700 mg via INTRAVENOUS
  Filled 2018-08-06: qty 70

## 2018-08-06 MED ORDER — DEXAMETHASONE SODIUM PHOSPHATE 10 MG/ML IJ SOLN
INTRAMUSCULAR | Status: AC
  Filled 2018-08-06: qty 1

## 2018-08-06 MED ORDER — PALONOSETRON HCL INJECTION 0.25 MG/5ML
0.2500 mg | Freq: Once | INTRAVENOUS | Status: AC
  Administered 2018-08-06: 0.25 mg via INTRAVENOUS

## 2018-08-06 MED ORDER — PALONOSETRON HCL INJECTION 0.25 MG/5ML
INTRAVENOUS | Status: AC
  Filled 2018-08-06: qty 5

## 2018-08-06 NOTE — Progress Notes (Signed)
Add Emend, change dex to 12mg  for premeds today due to increased Carboplatin dose (AUC 5.2) per MD. Continue Aloxi.  Hardie Pulley, PharmD, BCPS, BCOP

## 2018-08-06 NOTE — Telephone Encounter (Signed)
Gave AVS and calendar °

## 2018-08-06 NOTE — Patient Instructions (Signed)
[facility] Discharge Instructions for Patients Receiving Chemotherapy  Today you received the following chemotherapy agents: Carboplatin (Paraplatin) and Paclitaxel protein-bound (Abraxane)  To help prevent nausea and vomiting after your treatment, we encourage you to take your nausea medication as directed.    If you develop nausea and vomiting that is not controlled by your nausea medication, call the clinic.   BELOW ARE SYMPTOMS THAT SHOULD BE REPORTED IMMEDIATELY:  *FEVER GREATER THAN 100.5 F  *CHILLS WITH OR WITHOUT FEVER  NAUSEA AND VOMITING THAT IS NOT CONTROLLED WITH YOUR NAUSEA MEDICATION  *UNUSUAL SHORTNESS OF BREATH  *UNUSUAL BRUISING OR BLEEDING  TENDERNESS IN MOUTH AND THROAT WITH OR WITHOUT PRESENCE OF ULCERS  *URINARY PROBLEMS  *BOWEL PROBLEMS  UNUSUAL RASH Items with * indicate a potential emergency and should be followed up as soon as possible.  Feel free to call the clinic should you have any questions or concerns. The clinic phone number is [phone].  Please show the [facility] at check-in to the Emergency Department and triage nurse.

## 2018-08-06 NOTE — Assessment & Plan Note (Signed)
03/12/2018:Palpable masses in both breasts with family history of breast cancer, breast density category D, ultrasound measured these hypoechoic irregular mass left breast 2.3 cm, right breast no abnormality, biopsy left breast mass: IDC grade 3 ER 0%, PR 0%, HER-2 negative, Ki-67 90%, lymph node biopsy benign: T2N0, stage IIb  Recommendationbased on multidisciplinary tumor board: Genetics consultation 1. Neoadjuvant chemotherapy with Adriamycin and Cytoxan dose dense 4 followed byTaxol with carboplatinweekly 12 2. Followed by breast conserving surgery with sentinel lymph node study vs targeted axillary dissection 3. Followed by adjuvant radiation therapy  BRCA2 mutation: Patient contemplating on bilateral mastectomies with reconstruction Oophorectomy after the age of 17 ---------------------------------------------------------------------------------------------------------------- Current Treatment:Neoadjuvant chemotherapywith dose dense Adriamycin and Cytoxanx4 completed. Taxol started 05/20/2018, developed severe reaction, switchedto Abraxane from cycle 2, today is cycleCarboplatin added for next week  Today is #10   Chemo toxicities: 1.Neutropenia: on Granix injections prior to each chemotherapy and this is helped keep her neutrophil count better. 2.Fatigue 3. Diarrhea/Constipation/blood in stool:    I discussed with the patient that we missed giving carboplatin dose when her treatment got switched to Abraxane.  Today will be the last of the carboplatin treatments.  She has 2 more of weekly Abraxane left.  She has no peripheral neuroapthy at this point.

## 2018-08-11 ENCOUNTER — Inpatient Hospital Stay: Payer: BLUE CROSS/BLUE SHIELD

## 2018-08-11 ENCOUNTER — Other Ambulatory Visit: Payer: Self-pay | Admitting: Hematology and Oncology

## 2018-08-11 VITALS — BP 121/77 | HR 92 | Temp 99.0°F | Resp 20

## 2018-08-11 DIAGNOSIS — C50412 Malignant neoplasm of upper-outer quadrant of left female breast: Secondary | ICD-10-CM

## 2018-08-11 DIAGNOSIS — Z171 Estrogen receptor negative status [ER-]: Principal | ICD-10-CM

## 2018-08-11 MED ORDER — TBO-FILGRASTIM 480 MCG/0.8ML ~~LOC~~ SOSY
PREFILLED_SYRINGE | SUBCUTANEOUS | Status: AC
  Filled 2018-08-11: qty 0.8

## 2018-08-11 MED ORDER — TBO-FILGRASTIM 480 MCG/0.8ML ~~LOC~~ SOSY
480.0000 ug | PREFILLED_SYRINGE | Freq: Once | SUBCUTANEOUS | Status: AC
  Administered 2018-08-11: 480 ug via SUBCUTANEOUS

## 2018-08-13 ENCOUNTER — Inpatient Hospital Stay: Payer: BLUE CROSS/BLUE SHIELD

## 2018-08-13 ENCOUNTER — Ambulatory Visit (HOSPITAL_BASED_OUTPATIENT_CLINIC_OR_DEPARTMENT_OTHER): Payer: BLUE CROSS/BLUE SHIELD | Admitting: Medical

## 2018-08-13 ENCOUNTER — Telehealth: Payer: Self-pay

## 2018-08-13 VITALS — BP 120/83 | HR 86 | Temp 98.3°F | Resp 18

## 2018-08-13 DIAGNOSIS — Z171 Estrogen receptor negative status [ER-]: Secondary | ICD-10-CM

## 2018-08-13 DIAGNOSIS — C50412 Malignant neoplasm of upper-outer quadrant of left female breast: Secondary | ICD-10-CM

## 2018-08-13 DIAGNOSIS — R002 Palpitations: Secondary | ICD-10-CM

## 2018-08-13 DIAGNOSIS — Z95828 Presence of other vascular implants and grafts: Secondary | ICD-10-CM

## 2018-08-13 LAB — CBC WITH DIFFERENTIAL (CANCER CENTER ONLY)
Abs Immature Granulocytes: 0.15 10*3/uL — ABNORMAL HIGH (ref 0.00–0.07)
Basophils Absolute: 0 10*3/uL (ref 0.0–0.1)
Basophils Relative: 1 %
Eosinophils Absolute: 0.1 10*3/uL (ref 0.0–0.5)
Eosinophils Relative: 2 %
HCT: 30.5 % — ABNORMAL LOW (ref 36.0–46.0)
Hemoglobin: 10.3 g/dL — ABNORMAL LOW (ref 12.0–15.0)
Immature Granulocytes: 3 %
Lymphocytes Relative: 20 %
Lymphs Abs: 1.2 10*3/uL (ref 0.7–4.0)
MCH: 31.5 pg (ref 26.0–34.0)
MCHC: 33.8 g/dL (ref 30.0–36.0)
MCV: 93.3 fL (ref 80.0–100.0)
Monocytes Absolute: 0.5 10*3/uL (ref 0.1–1.0)
Monocytes Relative: 8 %
Neutro Abs: 4.1 10*3/uL (ref 1.7–7.7)
Neutrophils Relative %: 66 %
Platelet Count: 180 10*3/uL (ref 150–400)
RBC: 3.27 MIL/uL — ABNORMAL LOW (ref 3.87–5.11)
RDW: 11.9 % (ref 11.5–15.5)
WBC Count: 6 10*3/uL (ref 4.0–10.5)
nRBC: 0 % (ref 0.0–0.2)

## 2018-08-13 LAB — CMP (CANCER CENTER ONLY)
ALT: 62 U/L — ABNORMAL HIGH (ref 0–44)
AST: 41 U/L (ref 15–41)
Albumin: 4.1 g/dL (ref 3.5–5.0)
Alkaline Phosphatase: 106 U/L (ref 38–126)
Anion gap: 8 (ref 5–15)
BUN: 12 mg/dL (ref 6–20)
CO2: 28 mmol/L (ref 22–32)
Calcium: 9.7 mg/dL (ref 8.9–10.3)
Chloride: 106 mmol/L (ref 98–111)
Creatinine: 0.66 mg/dL (ref 0.44–1.00)
GFR, Est AFR Am: >60 mL/min (ref >60)
GFR, Estimated: >60 mL/min (ref >60)
Glucose, Bld: 117 mg/dL — ABNORMAL HIGH (ref 70–99)
Potassium: 4.1 mmol/L (ref 3.5–5.1)
Sodium: 142 mmol/L (ref 135–145)
Total Bilirubin: 0.3 mg/dL (ref 0.3–1.2)
Total Protein: 6.9 g/dL (ref 6.5–8.1)

## 2018-08-13 MED ORDER — ONDANSETRON HCL 4 MG/2ML IJ SOLN
INTRAMUSCULAR | Status: AC
  Filled 2018-08-13: qty 4

## 2018-08-13 MED ORDER — SODIUM CHLORIDE 0.9 % IV SOLN
Freq: Once | INTRAVENOUS | Status: AC
  Administered 2018-08-13: 16:00:00 via INTRAVENOUS
  Filled 2018-08-13: qty 250

## 2018-08-13 MED ORDER — ONDANSETRON HCL 4 MG/2ML IJ SOLN
8.0000 mg | Freq: Once | INTRAMUSCULAR | Status: AC
  Administered 2018-08-13: 8 mg via INTRAVENOUS

## 2018-08-13 MED ORDER — PACLITAXEL PROTEIN-BOUND CHEMO INJECTION 100 MG
60.0000 mg/m2 | Freq: Once | INTRAVENOUS | Status: AC
  Administered 2018-08-13: 100 mg via INTRAVENOUS
  Filled 2018-08-13: qty 20

## 2018-08-13 MED ORDER — SODIUM CHLORIDE 0.9% FLUSH
10.0000 mL | INTRAVENOUS | Status: DC | PRN
  Administered 2018-08-13: 10 mL
  Filled 2018-08-13: qty 10

## 2018-08-13 MED ORDER — HEPARIN SOD (PORK) LOCK FLUSH 100 UNIT/ML IV SOLN
500.0000 [IU] | Freq: Once | INTRAVENOUS | Status: AC | PRN
  Administered 2018-08-13: 500 [IU]
  Filled 2018-08-13: qty 5

## 2018-08-13 MED ORDER — SODIUM CHLORIDE 0.9% FLUSH
10.0000 mL | Freq: Once | INTRAVENOUS | Status: AC
  Administered 2018-08-13: 10 mL
  Filled 2018-08-13: qty 10

## 2018-08-13 NOTE — Telephone Encounter (Signed)
Returned pt call regarding her concern for intermittent palpitations. Pt states that her "heart is racing" and doesn't know why. Denies fever, diarrhea, caffeine use, dehydration, dizziness or acute respiratory infection. Pt denies stress or cardiac issues. Told pt to come as scheduled for her infusion appt today and we will evaluate her lab work prior to infusion. Will notify infusion RN and possibly symptom management if there are any abnormalities with labs or with vitals signs. Pt verbalized understanding.

## 2018-08-13 NOTE — Patient Instructions (Signed)
[facility] Discharge Instructions for Patients Receiving Chemotherapy  Today you received the following chemotherapy agents:  Abraxane.  To help prevent nausea and vomiting after your treatment, we encourage you to take your nausea medication as directed.   If you develop nausea and vomiting that is not controlled by your nausea medication, call the clinic.   BELOW ARE SYMPTOMS THAT SHOULD BE REPORTED IMMEDIATELY:  *FEVER GREATER THAN 100.5 F  *CHILLS WITH OR WITHOUT FEVER  NAUSEA AND VOMITING THAT IS NOT CONTROLLED WITH YOUR NAUSEA MEDICATION  *UNUSUAL SHORTNESS OF BREATH  *UNUSUAL BRUISING OR BLEEDING  TENDERNESS IN MOUTH AND THROAT WITH OR WITHOUT PRESENCE OF ULCERS  *URINARY PROBLEMS  *BOWEL PROBLEMS  UNUSUAL RASH Items with * indicate a potential emergency and should be followed up as soon as possible.  Feel free to call the clinic should you have any questions or concerns. The clinic phone number is [phone].  Please show the CHEMO ALERT CARD at check-in to the Emergency Department and triage nurse.   

## 2018-08-17 NOTE — Progress Notes (Signed)
The patient was seen in the infusion room today while she was receiving chemotherapy.  The patient was being treated with carboplatin and Abraxane.  She stated that she has had episodes of palpitations.  These are not associated with any activity although the patient says that she notes them at times when she was sleeping.  Her vital signs were reviewed.  A brief exam was completed.  The patient's cardiac exam showed regular rate and rhythm without murmurs rubs or gallops.  The patient was reassured and was told to follow-up with her primary care provider should this recur as they may wish to have a Holter monitor study.  Sandi Mealy, MHS, PA-C Physician Assistant

## 2018-08-18 ENCOUNTER — Other Ambulatory Visit: Payer: Self-pay | Admitting: Hematology and Oncology

## 2018-08-18 ENCOUNTER — Inpatient Hospital Stay: Payer: BLUE CROSS/BLUE SHIELD

## 2018-08-18 VITALS — BP 116/74 | HR 96 | Temp 98.9°F | Resp 18

## 2018-08-18 DIAGNOSIS — Z171 Estrogen receptor negative status [ER-]: Secondary | ICD-10-CM

## 2018-08-18 DIAGNOSIS — Z95828 Presence of other vascular implants and grafts: Secondary | ICD-10-CM

## 2018-08-18 DIAGNOSIS — C50412 Malignant neoplasm of upper-outer quadrant of left female breast: Secondary | ICD-10-CM | POA: Diagnosis not present

## 2018-08-18 MED ORDER — TBO-FILGRASTIM 480 MCG/0.8ML ~~LOC~~ SOSY
480.0000 ug | PREFILLED_SYRINGE | Freq: Once | SUBCUTANEOUS | Status: AC
  Administered 2018-08-18: 480 ug via SUBCUTANEOUS

## 2018-08-18 MED ORDER — TBO-FILGRASTIM 480 MCG/0.8ML ~~LOC~~ SOSY
PREFILLED_SYRINGE | SUBCUTANEOUS | Status: AC
  Filled 2018-08-18: qty 0.8

## 2018-08-18 NOTE — Patient Instructions (Signed)
Tbo-Filgrastim injection What is this medicine? TBO-FILGRASTIM (T B O fil GRA stim) is a granulocyte colony-stimulating factor that stimulates the growth of neutrophils, a type of white blood cell important in the body's fight against infection. It is used to reduce the incidence of fever and infection in patients with certain types of cancer who are receiving chemotherapy that affects the bone marrow. This medicine may be used for other purposes; ask your health care provider or pharmacist if you have questions. COMMON BRAND NAME(S): Granix What should I tell my health care provider before I take this medicine? They need to know if you have any of these conditions: -bone scan or tests planned -kidney disease -sickle cell anemia -an unusual or allergic reaction to tbo-filgrastim, filgrastim, pegfilgrastim, other medicines, foods, dyes, or preservatives -pregnant or trying to get pregnant -breast-feeding How should I use this medicine? This medicine is for injection under the skin. If you get this medicine at home, you will be taught how to prepare and give this medicine. Refer to the Instructions for Use that come with your medication packaging. Use exactly as directed. Take your medicine at regular intervals. Do not take your medicine more often than directed. It is important that you put your used needles and syringes in a special sharps container. Do not put them in a trash can. If you do not have a sharps container, call your pharmacist or healthcare provider to get one. Talk to your pediatrician regarding the use of this medicine in children. While this drug may be prescribed for children as young as 1 month of age for selected conditions, precautions do apply. Overdosage: If you think you have taken too much of this medicine contact a poison control center or emergency room at once. NOTE: This medicine is only for you. Do not share this medicine with others. What if I miss a dose? It is  important not to miss your dose. Call your doctor or health care professional if you miss a dose. What may interact with this medicine? This medicine may interact with the following medications: -medicines that may cause a release of neutrophils, such as lithium This list may not describe all possible interactions. Give your health care provider a list of all the medicines, herbs, non-prescription drugs, or dietary supplements you use. Also tell them if you smoke, drink alcohol, or use illegal drugs. Some items may interact with your medicine. What should I watch for while using this medicine? You may need blood work done while you are taking this medicine. What side effects may I notice from receiving this medicine? Side effects that you should report to your doctor or health care professional as soon as possible: -allergic reactions like skin rash, itching or hives, swelling of the face, lips, or tongue -back pain -blood in the urine -dark urine -dizziness -fast heartbeat -feeling faint -shortness of breath or breathing problems -signs and symptoms of infection like fever or chills; cough; or sore throat -signs and symptoms of kidney injury like trouble passing urine or change in the amount of urine -stomach or side pain, or pain at the shoulder -sweating -swelling of the legs, ankles, or abdomen -tiredness Side effects that usually do not require medical attention (report to your doctor or health care professional if they continue or are bothersome): -bone pain -diarrhea -headache -muscle pain -vomiting This list may not describe all possible side effects. Call your doctor for medical advice about side effects. You may report side effects to FDA at   1-800-FDA-1088. Where should I keep my medicine? Keep out of the reach of children. Store in a refrigerator between 2 and 8 degrees C (36 and 46 degrees F). Keep in carton to protect from light. Throw away this medicine if it is left out  of the refrigerator for more than 5 consecutive days. Throw away any unused medicine after the expiration date. NOTE: This sheet is a summary. It may not cover all possible information. If you have questions about this medicine, talk to your doctor, pharmacist, or health care provider.  2019 Elsevier/Gold Standard (2017-02-04 16:56:18)  

## 2018-08-18 NOTE — Progress Notes (Signed)
Patient Care Team: Patient, [name] as PCP - General (General Practice)  DIAGNOSIS:    ICD-10-CM   1. Malignant neoplasm of upper-outer quadrant of left breast in female, estrogen receptor negative ([place]) C50.412    Z17.1     SUMMARY OF ONCOLOGIC HISTORY:   Malignant neoplasm of upper-outer quadrant of left breast in female, estrogen receptor negative ([place])   03/12/2018 Initial Diagnosis    Palpable masses in both breasts with family history of breast cancer, breast density category D, ultrasound measured these hypoechoic irregular mass left breast 2.3 cm, right breast no abnormality, biopsy left breast mass: IDC grade 3 ER 0%, PR 0%, HER-2 negative, Ki-67 90%, lymph node biopsy benign: T2N0    03/18/2018 Cancer Staging    Staging form: Breast, AJCC 8th Edition - Clinical: Stage IIB (cT2, cN0, cM0, G3, ER-, PR-, HER2-) - Signed by Nicholas Lose, MD on 03/18/2018    03/30/2018 Breast MRI    Single biopsy-proven malignancy in the left 12 o'clock breast measures 2.3 x 1.3 x 1.5 cm.  Right breast without any malignancy detected.    04/02/2018 -  Neo-Adjuvant Chemotherapy    Neoadjuvant chemotherapy with dose dense Adriamycin and Cytoxan followed by Taxol and carboplatin    04/23/2018 Genetic Testing    Positive genetic testing: A pathogenic variant was identified in BRCA2 called c.8850_8851dup (p.Ala2951Glyfs*26) on the Common Hereditary Cancers Panel. The Common Hereditary Cancers Panel offered by [facility] includes sequencing and/or deletion duplication testing of the following 47 genes: APC, ATM, AXIN2, BARD1, BMPR1A, BRCA1, BRCA2, BRIP1, CDH1, CDKN2A (p14ARF), CDKN2A (p16INK4a), CKD4, CHEK2, CTNNA1, DICER1, EPCAM (Deletion/duplication testing only), GREM1 (promoter region deletion/duplication testing only), KIT, MEN1, MLH1, MSH2, MSH3, MSH6, MUTYH, NBN, NF1, NHTL1, PALB2, PDGFRA, PMS2, POLD1, POLE, PTEN, RAD50, RAD51C, RAD51D, SDHB, SDHC, SDHD, SMAD4, SMARCA4. STK11, TP53, TSC1, TSC2, and  VHL.  The following genes were evaluated for sequence changes only: SDHA and HOXB13 c.251G>A variant only. Genetic testing did detect a Variant of Unknown Significance (VUS) in the PALB2 gene called c.205C>T.  At this time, it is unknown if this variant is associated with increased cancer risk or if this is a normal finding, but most variants such as this get reclassified to being inconsequential. It should not be used to make medical management decisions.   The report date is 04/23/2018.     06/04/2018 -  Chemotherapy    The patient had palonosetron (ALOXI) injection 0.25 mg, 0.25 mg, Intravenous,  Once, 2 of 2 cycles Administration: 0.25 mg (07/16/2018), 0.25 mg (08/06/2018) Tbo-Filgrastim (GRANIX) injection 480 mcg, 480 mcg, Subcutaneous, Once, 1 of 1 cycle Administration: 480 mcg (06/30/2018) ondansetron (ZOFRAN) 8 mg in sodium chloride 0.9 % 50 mL IVPB, 8 mg (100 % of original dose 8 mg), Intravenous,  Once, 1 of 1 cycle Dose modification: 8 mg (original dose 8 mg, Cycle 1) CARBOplatin (PARAPLATIN) 260 mg in sodium chloride 0.9 % 250 mL chemo infusion, 260 mg (100 % of original dose 258.4 mg), Intravenous,  Once, 2 of 2 cycles Dose modification:   (original dose 258.4 mg, Cycle 2),   (original dose 258.4 mg, Cycle 3), 700 mg (original dose 258.4 mg, Cycle 3, Reason: Provider Judgment) Administration: 260 mg (07/16/2018), 700 mg (08/06/2018) fosaprepitant (EMEND) 150 mg, dexamethasone (DECADRON) 12 mg in sodium chloride 0.9 % 145 mL IVPB, , Intravenous,  Once, 1 of 1 cycle Administration:  (08/06/2018) PACLitaxel-protein bound (ABRAXANE) chemo infusion 125 mg, 80 mg/m2 = 125 mg (80 % of original dose  100 mg/m2), Intravenous, Once, 3 of 3 cycles Dose modification: 80 mg/m2 (original dose 100 mg/m2, Cycle 1, Reason: Provider Judgment), 60 mg/m2 (original dose 100 mg/m2, Cycle 1, Reason: Dose not tolerated) Administration: 125 mg (06/04/2018), 100 mg (06/18/2018), 100 mg (07/02/2018), 100 mg (07/09/2018), 100  mg (07/16/2018), 100 mg (07/30/2018), 100 mg (08/06/2018), 100 mg (08/13/2018), 100 mg (06/25/2018)  for chemotherapy treatment.      CHIEF COMPLIANT: Cycle 12 Abraxane and Carboplatin  INTERVAL HISTORY: Samantha Mcneil is a 33 y.o. with above-mentioned history of triple negative left breast cancer currently on neoadjuvant chemotherapy. She completed 4 cycles of dose dense Adriamycin andCytoxanand is here today forcycle12ofAbraxane and Carboplatin. She presents to the clinic today with her mom. She denies numbness or tingling in hands and feet or anxiety. She has noticed heart palpitations that come and go every day 2-3 times and during sleep and last for about a minute. Her labs from show: WBC 7.1, Hg 9.7, platelets 141.   REVIEW OF SYSTEMS:   Constitutional: Denies fevers, chills or abnormal weight loss Eyes: Denies blurriness of vision Ears, nose, mouth, throat, and face: Denies mucositis or sore throat Respiratory: Denies cough, dyspnea or wheezes Cardiovascular: Denies chest discomfort (+) palpitations Gastrointestinal: Denies nausea, heartburn or change in bowel habits Skin: Denies abnormal skin rashes Lymphatics: Denies new lymphadenopathy or easy bruising Neurological: Denies numbness, tingling or new weaknesses Behavioral/Psych: Mood is stable, no new changes  Extremities: No lower extremity edema Breast: denies any pain or lumps or nodules in either breasts All other systems were reviewed with the patient and are negative.  I have reviewed the past medical history, past surgical history, social history and family history with the patient and they are unchanged from previous note.  ALLERGIES:  has No Known Allergies.  MEDICATIONS:  Current Outpatient Medications  Medication Sig Dispense Refill  . ibuprofen (ADVIL,MOTRIN) 200 MG tablet Take 200 mg by mouth every 6 (six) hours as needed.    Marland Kitchen levofloxacin (LEVAQUIN) 500 MG tablet Take 1 tablet (500 mg total) by mouth daily. 7  tablet 0  . lidocaine-prilocaine (EMLA) cream Apply to affected area once 30 g 1  . magic mouthwash SOLN Take 5 mLs by mouth 4 (four) times daily as needed for mouth pain. 240 mL 0  . triamcinolone (KENALOG) 0.025 % ointment Apply 1 application topically 2 (two) times daily. 30 g 1  . venlafaxine XR (EFFEXOR-XR) 37.5 MG 24 hr capsule Take 1 capsule (37.5 mg total) by mouth daily with breakfast. 30 capsule 6   No current facility-administered medications for this visit.     PHYSICAL EXAMINATION: ECOG PERFORMANCE STATUS: 1 - Symptomatic but completely ambulatory  Vitals:   08/20/18 0944  BP: 113/74  Pulse: 93  Resp: 18  Temp: 98.6 F (37 C)  SpO2: 100%   Filed Weights   08/20/18 0944  Weight: 151 lb 11.2 oz (68.8 kg)    GENERAL: alert, no distress and comfortable SKIN: skin color, texture, turgor are normal, no rashes or significant lesions EYES: normal, Conjunctiva are pink and non-injected, sclera clear OROPHARYNX: no exudate, no erythema and lips, buccal mucosa, and tongue normal  NECK: supple, thyroid normal size, non-tender, without nodularity LYMPH: no palpable lymphadenopathy in the cervical, axillary or inguinal LUNGS: clear to auscultation and percussion with normal breathing effort HEART: regular rate & rhythm and no murmurs and no lower extremity edema ABDOMEN: abdomen soft, non-tender and normal bowel sounds MUSCULOSKELETAL: no cyanosis of digits and no clubbing  NEURO:  alert & oriented x 3 with fluent speech, no focal motor/sensory deficits EXTREMITIES: No lower extremity edema  LABORATORY DATA:  I have reviewed the data as listed CMP Latest Ref Rng & Units 08/13/2018 08/06/2018 07/30/2018  Glucose 70 - 99 mg/dL 117(H) 91 97  BUN 6 - 20 mg/dL '12 12 7  ' Creatinine 0.44 - 1.00 mg/dL 0.66 0.63 0.63  Sodium 135 - 145 mmol/L 142 142 143  Potassium 3.5 - 5.1 mmol/L 4.1 4.1 3.8  Chloride 98 - 111 mmol/L 106 108 108  CO2 22 - 32 mmol/L '28 26 26  ' Calcium 8.9 - 10.3 mg/dL  9.7 9.5 9.6  Total Protein 6.5 - 8.1 g/dL 6.9 6.7 6.6  Total Bilirubin 0.3 - 1.2 mg/dL 0.3 0.4 <0.2(L)  Alkaline Phos 38 - 126 U/L 106 90 95  AST 15 - 41 U/L 41 21 24  ALT 0 - 44 U/L 62(H) 32 44    Lab Results  Component Value Date   WBC 7.1 08/20/2018   HGB 9.7 (L) 08/20/2018   HCT 28.1 (L) 08/20/2018   MCV 91.8 08/20/2018   PLT 141 (L) 08/20/2018   NEUTROABS PENDING 08/20/2018    ASSESSMENT & PLAN:  Malignant neoplasm of upper-outer quadrant of left breast in female, estrogen receptor negative ([place]) 03/12/2018:Palpable masses in both breasts with family history of breast cancer, breast density category D, ultrasound measured these hypoechoic irregular mass left breast 2.3 cm, right breast no abnormality, biopsy left breast mass: IDC grade 3 ER 0%, PR 0%, HER-2 negative, Ki-67 90%, lymph node biopsy benign: T2N0, stage IIb  Recommendationbased on multidisciplinary tumor board: Genetics consultation 1. Neoadjuvant chemotherapy with Adriamycin and Cytoxan dose dense 4 followed byTaxol with carboplatinweekly 12 2. Followed by breast conserving surgery with sentinel lymph node study vs targeted axillary dissection 3. Followed by adjuvant radiation therapy  BRCA2 mutation: Patient contemplating on bilateral mastectomies with reconstruction Oophorectomy after the age of 69 ---------------------------------------------------------------------------------------------------------------- Current Treatment:Neoadjuvant chemotherapywith dose dense Adriamycin and Cytoxanx4 completed. Taxol started 05/20/2018, developed severe reaction, switchedto Abraxane from cycle 2, today is cycleCarboplatin addedfor next week  Today is #12   Chemo toxicities: 1.Neutropenia: on Granix injections prior to each chemotherapy and this is helped keep her neutrophil count better. 2.Fatigue 3. Diarrhea/Constipation/blood in stool  Patient has been set up for breast MRI on 08/27/2018 She also  has appointments to see surgery.  She will be presented in the tumor board to the MRI. I will see her after breast MRI to discuss the result    No orders of the defined types were placed in this encounter.  The patient has a good understanding of the overall plan. she agrees with it. she will call with any problems that may develop before the next visit here.  Nicholas Lose, MD 08/20/2018  Julious Oka Dorshimer am acting as scribe for Dr. Nicholas Lose.  I have reviewed the above documentation for accuracy and completeness, and I agree with the above.

## 2018-08-20 ENCOUNTER — Telehealth: Payer: Self-pay | Admitting: Hematology and Oncology

## 2018-08-20 ENCOUNTER — Encounter: Payer: Self-pay | Admitting: *Deleted

## 2018-08-20 ENCOUNTER — Inpatient Hospital Stay: Payer: BLUE CROSS/BLUE SHIELD

## 2018-08-20 ENCOUNTER — Inpatient Hospital Stay (HOSPITAL_BASED_OUTPATIENT_CLINIC_OR_DEPARTMENT_OTHER): Payer: BLUE CROSS/BLUE SHIELD | Admitting: Hematology and Oncology

## 2018-08-20 ENCOUNTER — Other Ambulatory Visit: Payer: Self-pay | Admitting: Hematology and Oncology

## 2018-08-20 DIAGNOSIS — C50412 Malignant neoplasm of upper-outer quadrant of left female breast: Secondary | ICD-10-CM

## 2018-08-20 DIAGNOSIS — D701 Agranulocytosis secondary to cancer chemotherapy: Secondary | ICD-10-CM | POA: Diagnosis not present

## 2018-08-20 DIAGNOSIS — R002 Palpitations: Secondary | ICD-10-CM | POA: Diagnosis not present

## 2018-08-20 DIAGNOSIS — Z1501 Genetic susceptibility to malignant neoplasm of breast: Secondary | ICD-10-CM

## 2018-08-20 DIAGNOSIS — Z1502 Genetic susceptibility to malignant neoplasm of ovary: Secondary | ICD-10-CM

## 2018-08-20 DIAGNOSIS — Z171 Estrogen receptor negative status [ER-]: Secondary | ICD-10-CM | POA: Diagnosis not present

## 2018-08-20 DIAGNOSIS — Z95828 Presence of other vascular implants and grafts: Secondary | ICD-10-CM

## 2018-08-20 LAB — CBC WITH DIFFERENTIAL (CANCER CENTER ONLY)
Abs Immature Granulocytes: 0.23 10*3/uL — ABNORMAL HIGH (ref 0.00–0.07)
Basophils Absolute: 0.1 10*3/uL (ref 0.0–0.1)
Basophils Relative: 1 %
Eosinophils Absolute: 0.1 10*3/uL (ref 0.0–0.5)
Eosinophils Relative: 1 %
HCT: 28.1 % — ABNORMAL LOW (ref 36.0–46.0)
Hemoglobin: 9.7 g/dL — ABNORMAL LOW (ref 12.0–15.0)
Immature Granulocytes: 3 %
Lymphocytes Relative: 17 %
Lymphs Abs: 1.2 10*3/uL (ref 0.7–4.0)
MCH: 31.7 pg (ref 26.0–34.0)
MCHC: 34.5 g/dL (ref 30.0–36.0)
MCV: 91.8 fL (ref 80.0–100.0)
Monocytes Absolute: 0.4 10*3/uL (ref 0.1–1.0)
Monocytes Relative: 6 %
Neutro Abs: 5.1 10*3/uL (ref 1.7–7.7)
Neutrophils Relative %: 72 %
Platelet Count: 141 10*3/uL — ABNORMAL LOW (ref 150–400)
RBC: 3.06 MIL/uL — ABNORMAL LOW (ref 3.87–5.11)
RDW: 11.5 % (ref 11.5–15.5)
WBC Count: 7.1 10*3/uL (ref 4.0–10.5)
nRBC: 0 % (ref 0.0–0.2)

## 2018-08-20 LAB — CMP (CANCER CENTER ONLY)
ALT: 33 U/L (ref 0–44)
AST: 18 U/L (ref 15–41)
Albumin: 3.9 g/dL (ref 3.5–5.0)
Alkaline Phosphatase: 114 U/L (ref 38–126)
Anion gap: 9 (ref 5–15)
BUN: 8 mg/dL (ref 6–20)
CO2: 27 mmol/L (ref 22–32)
Calcium: 9.4 mg/dL (ref 8.9–10.3)
Chloride: 106 mmol/L (ref 98–111)
Creatinine: 0.66 mg/dL (ref 0.44–1.00)
GFR, Est AFR Am: >60 mL/min (ref >60)
GFR, Estimated: >60 mL/min (ref >60)
Glucose, Bld: 98 mg/dL (ref 70–99)
Potassium: 3.7 mmol/L (ref 3.5–5.1)
Sodium: 142 mmol/L (ref 135–145)
Total Bilirubin: 0.3 mg/dL (ref 0.3–1.2)
Total Protein: 6.7 g/dL (ref 6.5–8.1)

## 2018-08-20 MED ORDER — PROCHLORPERAZINE MALEATE 10 MG PO TABS
ORAL_TABLET | ORAL | Status: AC
  Filled 2018-08-20: qty 1

## 2018-08-20 MED ORDER — ONDANSETRON HCL 4 MG/2ML IJ SOLN
INTRAMUSCULAR | Status: AC
  Filled 2018-08-20: qty 2

## 2018-08-20 MED ORDER — SODIUM CHLORIDE 0.9% FLUSH
10.0000 mL | Freq: Once | INTRAVENOUS | Status: AC
  Administered 2018-08-20: 10 mL
  Filled 2018-08-20: qty 10

## 2018-08-20 MED ORDER — SODIUM CHLORIDE 0.9 % IV SOLN
Freq: Once | INTRAVENOUS | Status: AC
  Administered 2018-08-20: 10:00:00 via INTRAVENOUS
  Filled 2018-08-20: qty 250

## 2018-08-20 MED ORDER — PROCHLORPERAZINE MALEATE 10 MG PO TABS
10.0000 mg | ORAL_TABLET | Freq: Four times a day (QID) | ORAL | Status: DC | PRN
  Administered 2018-08-20: 10 mg via ORAL

## 2018-08-20 MED ORDER — PACLITAXEL PROTEIN-BOUND CHEMO INJECTION 100 MG
60.0000 mg/m2 | Freq: Once | INTRAVENOUS | Status: AC
  Administered 2018-08-20: 100 mg via INTRAVENOUS
  Filled 2018-08-20: qty 20

## 2018-08-20 MED ORDER — SODIUM CHLORIDE 0.9% FLUSH
10.0000 mL | INTRAVENOUS | Status: DC | PRN
  Administered 2018-08-20: 10 mL
  Filled 2018-08-20: qty 10

## 2018-08-20 MED ORDER — HEPARIN SOD (PORK) LOCK FLUSH 100 UNIT/ML IV SOLN
500.0000 [IU] | Freq: Once | INTRAVENOUS | Status: AC | PRN
  Administered 2018-08-20: 500 [IU]
  Filled 2018-08-20: qty 5

## 2018-08-20 NOTE — Patient Instructions (Signed)
[facility] Discharge Instructions for Patients Receiving Chemotherapy  Today you received the following chemotherapy agents:  Abraxane.  To help prevent nausea and vomiting after your treatment, we encourage you to take your nausea medication as directed.   If you develop nausea and vomiting that is not controlled by your nausea medication, call the clinic.   BELOW ARE SYMPTOMS THAT SHOULD BE REPORTED IMMEDIATELY:  *FEVER GREATER THAN 100.5 F  *CHILLS WITH OR WITHOUT FEVER  NAUSEA AND VOMITING THAT IS NOT CONTROLLED WITH YOUR NAUSEA MEDICATION  *UNUSUAL SHORTNESS OF BREATH  *UNUSUAL BRUISING OR BLEEDING  TENDERNESS IN MOUTH AND THROAT WITH OR WITHOUT PRESENCE OF ULCERS  *URINARY PROBLEMS  *BOWEL PROBLEMS  UNUSUAL RASH Items with * indicate a potential emergency and should be followed up as soon as possible.  Feel free to call the clinic should you have any questions or concerns. The clinic phone number is [phone].  Please show the CHEMO ALERT CARD at check-in to the Emergency Department and triage nurse.   

## 2018-08-20 NOTE — Telephone Encounter (Signed)
No los °

## 2018-08-20 NOTE — Assessment & Plan Note (Signed)
03/12/2018:Palpable masses in both breasts with family history of breast cancer, breast density category D, ultrasound measured these hypoechoic irregular mass left breast 2.3 cm, right breast no abnormality, biopsy left breast mass: IDC grade 3 ER 0%, PR 0%, HER-2 negative, Ki-67 90%, lymph node biopsy benign: T2N0, stage IIb  Recommendationbased on multidisciplinary tumor board: Genetics consultation 1. Neoadjuvant chemotherapy with Adriamycin and Cytoxan dose dense 4 followed byTaxol with carboplatinweekly 12 2. Followed by breast conserving surgery with sentinel lymph node study vs targeted axillary dissection 3. Followed by adjuvant radiation therapy  BRCA2 mutation: Patient contemplating on bilateral mastectomies with reconstruction Oophorectomy after the age of 46 ---------------------------------------------------------------------------------------------------------------- Current Treatment:Neoadjuvant chemotherapywith dose dense Adriamycin and Cytoxanx4 completed. Taxol started 05/20/2018, developed severe reaction, switchedto Abraxane from cycle 2, today is cycleCarboplatin addedfor next week  Today is #12   Chemo toxicities: 1.Neutropenia: on Granix injections prior to each chemotherapy and this is helped keep her neutrophil count better. 2.Fatigue 3. Diarrhea/Constipation/blood in stool  Patient has been set up for breast MRI on 08/27/2018 She also has appointments to see surgery.  She will be presented in the tumor board to the MRI. I will see her after surgery to discuss the final pathology report. 

## 2018-08-27 ENCOUNTER — Ambulatory Visit (HOSPITAL_COMMUNITY): Payer: BLUE CROSS/BLUE SHIELD

## 2018-08-27 ENCOUNTER — Other Ambulatory Visit: Payer: Medicaid Other

## 2018-08-27 ENCOUNTER — Ambulatory Visit: Payer: Medicaid Other

## 2018-08-27 ENCOUNTER — Ambulatory Visit (HOSPITAL_COMMUNITY)
Admission: RE | Admit: 2018-08-27 | Discharge: 2018-08-27 | Disposition: A | Discharge status: Home / Self Care | Payer: BLUE CROSS/BLUE SHIELD | Source: Ambulatory Visit | Attending: Adult Health | Admitting: Adult Health

## 2018-08-27 ENCOUNTER — Ambulatory Visit: Payer: Medicaid Other | Admitting: Hematology and Oncology

## 2018-08-27 MED FILL — VENLAFAXINE HCL ER 37.5 MG: 37.5 | 30 days supply | Qty: 30 | Fill #5

## 2018-08-27 NOTE — Progress Notes (Signed)
Patient scheduled for Breast MRI 2/27 Patient no call/no show for appt  bhj

## 2018-09-01 ENCOUNTER — Ambulatory Visit (HOSPITAL_COMMUNITY)
Admission: RE | Admit: 2018-09-01 | Discharge: 2018-09-01 | Disposition: A | Discharge status: Home / Self Care | Payer: BLUE CROSS/BLUE SHIELD | Source: Ambulatory Visit | Attending: Adult Health | Admitting: Adult Health

## 2018-09-01 DIAGNOSIS — Z171 Estrogen receptor negative status [ER-]: Secondary | ICD-10-CM | POA: Insufficient documentation

## 2018-09-01 DIAGNOSIS — C50412 Malignant neoplasm of upper-outer quadrant of left female breast: Secondary | ICD-10-CM | POA: Diagnosis not present

## 2018-09-01 MED ORDER — GADOBUTROL 1 MMOL/ML IV SOLN
7.0000 mL | Freq: Once | INTRAVENOUS | Status: AC | PRN
  Administered 2018-09-01: 7 mL via INTRAVENOUS

## 2018-09-01 NOTE — Progress Notes (Signed)
Patient Care Team: Patient, [name] as PCP - General (General Practice)  DIAGNOSIS:    ICD-10-CM   1. Malignant neoplasm of upper-outer quadrant of left breast in female, estrogen receptor negative ([place]) C50.412    Z17.1     SUMMARY OF ONCOLOGIC HISTORY:   Malignant neoplasm of upper-outer quadrant of left breast in female, estrogen receptor negative ([place])   03/12/2018 Initial Diagnosis    Palpable masses in both breasts with family history of breast cancer, breast density category D, ultrasound measured these hypoechoic irregular mass left breast 2.3 cm, right breast no abnormality, biopsy left breast mass: IDC grade 3 ER 0%, PR 0%, HER-2 negative, Ki-67 90%, lymph node biopsy benign: T2N0    03/18/2018 Cancer Staging    Staging form: Breast, AJCC 8th Edition - Clinical: Stage IIB (cT2, cN0, cM0, G3, ER-, PR-, HER2-) - Signed by Nicholas Lose, MD on 03/18/2018    03/30/2018 Breast MRI    Single biopsy-proven malignancy in the left 12 o'clock breast measures 2.3 x 1.3 x 1.5 cm.  Right breast without any malignancy detected.    04/02/2018 - 08/20/2018 Neo-Adjuvant Chemotherapy    Neoadjuvant chemotherapy with dose dense Adriamycin and Cytoxan followed by Taxol and carboplatin    04/23/2018 Genetic Testing    Positive genetic testing: A pathogenic variant was identified in BRCA2 called c.8850_8851dup (p.Ala2951Glyfs*26) on the Common Hereditary Cancers Panel. The Common Hereditary Cancers Panel offered by [facility] includes sequencing and/or deletion duplication testing of the following 47 genes: APC, ATM, AXIN2, BARD1, BMPR1A, BRCA1, BRCA2, BRIP1, CDH1, CDKN2A (p14ARF), CDKN2A (p16INK4a), CKD4, CHEK2, CTNNA1, DICER1, EPCAM (Deletion/duplication testing only), GREM1 (promoter region deletion/duplication testing only), KIT, MEN1, MLH1, MSH2, MSH3, MSH6, MUTYH, NBN, NF1, NHTL1, PALB2, PDGFRA, PMS2, POLD1, POLE, PTEN, RAD50, RAD51C, RAD51D, SDHB, SDHC, SDHD, SMAD4, SMARCA4. STK11, TP53, TSC1,  TSC2, and VHL.  The following genes were evaluated for sequence changes only: SDHA and HOXB13 c.251G>A variant only. Genetic testing did detect a Variant of Unknown Significance (VUS) in the PALB2 gene called c.205C>T.  At this time, it is unknown if this variant is associated with increased cancer risk or if this is a normal finding, but most variants such as this get reclassified to being inconsequential. It should not be used to make medical management decisions.   The report date is 04/23/2018.     09/01/2018 Breast MRI        CHIEF COMPLIANT: Follow-up after completion of chemotherapy to review breast MRI  INTERVAL HISTORY: Samantha Mcneil is a 33 y.o. with above-mentioned history of triple negative left breast cancer currentlyonneoadjuvant chemotherapy. Shecompleted 4 cycles of dose dense Adriamycin andCytoxanand 12 cyclesofAbraxane and Carboplatin on 08/20/18.She had a breast MRI on 09/01/18 that is not yet read. She presents to the clinic todaywith her parents. She reports a dry cough and itchiness in her throat that improved with allergy medication, and denies a fever. She will meet with her surgeon tomorrow and is still planning on bilateral mastectomies with reconstruction.  REVIEW OF SYSTEMS:   Constitutional: Denies fevers, chills or abnormal weight loss Eyes: Denies blurriness of vision Ears, nose, mouth, throat, and face: Denies mucositis (+) itchy throat Respiratory: Denies dyspnea or wheezes (+) cough Cardiovascular: Denies palpitation, chest discomfort Gastrointestinal: Denies nausea, heartburn or change in bowel habits Skin: Denies abnormal skin rashes Lymphatics: Denies new lymphadenopathy or easy bruising Neurological: Denies numbness, tingling or new weaknesses Behavioral/Psych: Mood is stable, no new changes  Extremities: No lower extremity edema Breast: denies any  pain or lumps or nodules in either breasts All other systems were reviewed with the patient and are  negative.  I have reviewed the past medical history, past surgical history, social history and family history with the patient and they are unchanged from previous note.  ALLERGIES:  has No Known Allergies.  MEDICATIONS:  Current Outpatient Medications  Medication Sig Dispense Refill  . ibuprofen (ADVIL,MOTRIN) 200 MG tablet Take 200 mg by mouth every 6 (six) hours as needed.    Marland Kitchen levofloxacin (LEVAQUIN) 500 MG tablet Take 1 tablet (500 mg total) by mouth daily. 7 tablet 0  . lidocaine-prilocaine (EMLA) cream Apply to affected area once 30 g 1  . magic mouthwash SOLN Take 5 mLs by mouth 4 (four) times daily as needed for mouth pain. 240 mL 0  . triamcinolone (KENALOG) 0.025 % ointment Apply 1 application topically 2 (two) times daily. 30 g 1  . venlafaxine XR (EFFEXOR-XR) 37.5 MG 24 hr capsule Take 1 capsule (37.5 mg total) by mouth daily with breakfast. 30 capsule 6   No current facility-administered medications for this visit.     PHYSICAL EXAMINATION: ECOG PERFORMANCE STATUS: 1 - Symptomatic but completely ambulatory  Vitals:   09/02/18 0848  BP: 116/74  Pulse: 100  Resp: 18  Temp: 99.1 F (37.3 C)  SpO2: 99%   Filed Weights   09/02/18 0848  Weight: 153 lb 11.2 oz (69.7 kg)    GENERAL: alert, no distress and comfortable SKIN: skin color, texture, turgor are normal, no rashes or significant lesions EYES: normal, Conjunctiva are pink and non-injected, sclera clear OROPHARYNX: no exudate, no erythema and lips, buccal mucosa, and tongue normal  NECK: supple, thyroid normal size, non-tender, without nodularity LYMPH: no palpable lymphadenopathy in the cervical, axillary or inguinal LUNGS: clear to auscultation and percussion with normal breathing effort HEART: regular rate & rhythm and no murmurs and no lower extremity edema ABDOMEN: abdomen soft, non-tender and normal bowel sounds MUSCULOSKELETAL: no cyanosis of digits and no clubbing  NEURO: alert & oriented x 3 with  fluent speech, no focal motor/sensory deficits EXTREMITIES: No lower extremity edema  LABORATORY DATA:  I have reviewed the data as listed CMP Latest Ref Rng & Units 08/20/2018 08/13/2018 08/06/2018  Glucose 70 - 99 mg/dL 98 117(H) 91  BUN 6 - 20 mg/dL _0 Creatinine 0.44 - 1.00 mg/dL 0.66 0.66 0.63  Sodium 135 - 145 mmol/L 142 142 142  Potassium 3.5 - 5.1 mmol/L 3.7 4.1 4.1  Chloride 98 - 111 mmol/L 106 106 108  CO2 22 - 32 mmol/L _1 Calcium 8.9 - 10.3 mg/dL 9.4 9.7 9.5  Total Protein 6.5 - 8.1 g/dL 6.7 6.9 6.7  Total Bilirubin 0.3 - 1.2 mg/dL 0.3 0.3 0.4  Alkaline Phos 38 - 126 U/L 114 106 90  AST 15 - 41 U/L 18 41 21  ALT 0 - 44 U/L 33 62(H) 32    Lab Results  Component Value Date   WBC 7.1 08/20/2018   HGB 9.7 (L) 08/20/2018   HCT 28.1 (L) 08/20/2018   MCV 91.8 08/20/2018   PLT 141 (L) 08/20/2018   NEUTROABS 5.1 08/20/2018    ASSESSMENT & PLAN:  Malignant neoplasm of upper-outer quadrant of left breast in female, estrogen receptor negative ([place]) 03/12/2018:Palpable masses in both breasts with family history of breast cancer, breast density category D, ultrasound measured these hypoechoic irregular mass left breast 2.3 cm, right breast no abnormality, biopsy left breast mass:  IDC grade 3 ER 0%, PR 0%, HER-2 negative, Ki-67 90%, lymph node biopsy benign: T2N0, stage IIb  Recommendationbased on multidisciplinary tumor board: Genetics consultation 1. Neoadjuvant chemotherapy with Adriamycin and Cytoxan dose dense 4 followed byTaxol with carboplatinweekly 12  completed 08/20/2018 2. Followed by bilateral mastectomies with reconstruction with sentinel lymph node study vs targeted axillary dissection 3. Followed by adjuvant radiation therapy  BRCA2 mutation: Patient contemplating on bilateral mastectomies with reconstruction Oophorectomy after the age of  79 ---------------------------------------------------------------------------------------------------------------- Breast MRI 09/01/2018: It has not been officially read but to my review there appears to be a complete radiologic response.  Plan: Surgery: Patient wishes to undergo bilateral mastectomies with reconstruction. Return to clinic after surgery to discuss results.   No orders of the defined types were placed in this encounter.  The patient has a good understanding of the overall plan. she agrees with it. she will call with any problems that may develop before the next visit here.  Nicholas Lose, MD 09/02/2018  Julious Oka Dorshimer am acting as scribe for Dr. Nicholas Lose.  I have reviewed the above documentation for accuracy and completeness, and I agree with the above.

## 2018-09-02 ENCOUNTER — Inpatient Hospital Stay: Payer: BLUE CROSS/BLUE SHIELD | Attending: Hematology and Oncology | Admitting: Hematology and Oncology

## 2018-09-02 DIAGNOSIS — Z171 Estrogen receptor negative status [ER-]: Secondary | ICD-10-CM | POA: Diagnosis not present

## 2018-09-02 DIAGNOSIS — Z1502 Genetic susceptibility to malignant neoplasm of ovary: Secondary | ICD-10-CM

## 2018-09-02 DIAGNOSIS — Z9013 Acquired absence of bilateral breasts and nipples: Secondary | ICD-10-CM | POA: Diagnosis not present

## 2018-09-02 DIAGNOSIS — Z1501 Genetic susceptibility to malignant neoplasm of breast: Secondary | ICD-10-CM

## 2018-09-02 DIAGNOSIS — C50412 Malignant neoplasm of upper-outer quadrant of left female breast: Secondary | ICD-10-CM | POA: Diagnosis present

## 2018-09-02 NOTE — Assessment & Plan Note (Signed)
03/12/2018:Palpable masses in both breasts with family history of breast cancer, breast density category D, ultrasound measured these hypoechoic irregular mass left breast 2.3 cm, right breast no abnormality, biopsy left breast mass: IDC grade 3 ER 0%, PR 0%, HER-2 negative, Ki-67 90%, lymph node biopsy benign: T2N0, stage IIb  Recommendationbased on multidisciplinary tumor board: Genetics consultation 1. Neoadjuvant chemotherapy with Adriamycin and Cytoxan dose dense 4 followed byTaxol with carboplatinweekly 12 completed 08/20/2018 2. Followed by breast conserving surgery with sentinel lymph node study vs targeted axillary dissection 3. Followed by adjuvant radiation therapy  BRCA2 mutation: Patient contemplating on bilateral mastectomies with reconstruction Oophorectomy after the age of 84 ---------------------------------------------------------------------------------------------------------------- Breast MRI 09/01/2018:  Plan: Surgery

## 2018-09-04 ENCOUNTER — Other Ambulatory Visit: Payer: Self-pay | Admitting: General Surgery

## 2018-09-04 DIAGNOSIS — C50412 Malignant neoplasm of upper-outer quadrant of left female breast: Secondary | ICD-10-CM

## 2018-09-08 ENCOUNTER — Other Ambulatory Visit: Payer: Self-pay | Admitting: General Surgery

## 2018-09-08 DIAGNOSIS — C50412 Malignant neoplasm of upper-outer quadrant of left female breast: Secondary | ICD-10-CM

## 2018-09-10 ENCOUNTER — Telehealth: Payer: Self-pay | Admitting: Hematology and Oncology

## 2018-09-10 NOTE — Telephone Encounter (Signed)
Scheduled appt per 3/12 sch message - sent reminder letter in the mail with appt date and time

## 2018-09-21 ENCOUNTER — Encounter: Payer: Self-pay | Admitting: Plastic Surgery

## 2018-09-21 ENCOUNTER — Ambulatory Visit (INDEPENDENT_AMBULATORY_CARE_PROVIDER_SITE_OTHER): Payer: BLUE CROSS/BLUE SHIELD | Admitting: Plastic Surgery

## 2018-09-21 ENCOUNTER — Other Ambulatory Visit: Payer: Self-pay

## 2018-09-21 VITALS — BP 117/83 | HR 83 | Temp 98.7°F | Ht 64.0 in | Wt 154.0 lb

## 2018-09-21 DIAGNOSIS — Z1509 Genetic susceptibility to other malignant neoplasm: Secondary | ICD-10-CM | POA: Diagnosis not present

## 2018-09-21 DIAGNOSIS — C50412 Malignant neoplasm of upper-outer quadrant of left female breast: Secondary | ICD-10-CM | POA: Diagnosis not present

## 2018-09-21 DIAGNOSIS — Z1501 Genetic susceptibility to malignant neoplasm of breast: Secondary | ICD-10-CM

## 2018-09-21 DIAGNOSIS — Z171 Estrogen receptor negative status [ER-]: Secondary | ICD-10-CM | POA: Diagnosis not present

## 2018-09-21 NOTE — H&P (View-Only) (Signed)
Patient ID: Samantha Mcneil, female    DOB: Aug 23, 1985, 33 y.o.   MRN: [id number]   Chief Complaint  Patient presents with  . Breast Problem    The patient is a 33 yrs old female here for consultation for breast reconstruction.  She noted a mass on the left breast which lead to the diagnosis of invasive carcinoma of LEFT upper outer quadrant that is Estrogen and Progesterone negative and Her-2 negative. The lymph nodes have been negative.  She had an MRI that which confirmed no lymphadenopathy and a negative right breast.  She is BRCA-2 positive.  She is 5 feet 4 inches tall and 154 pounds.  She is a 33 B and would like to be around the same size.  She finished her chemo in February (Adriamycin/Cytoxan and Abraxane and Carboplatin).  She is wanting to have bilateral mastectomies.  She is not a smoker.  She has a very positive family history of breast cancer.  She is married and has to children.  They are not planning on having any more children.   Review of Systems  Constitutional: Negative.  Negative for activity change and appetite change.  HENT: Negative.  Negative for congestion.   Eyes: Negative.   Respiratory: Negative.  Negative for chest tightness.   Cardiovascular: Negative.  Negative for leg swelling.  Gastrointestinal: Negative.  Negative for abdominal distention and abdominal pain.  Endocrine: Negative.   Genitourinary: Negative.   Musculoskeletal: Negative.  Negative for back pain and myalgias.  Neurological: Negative.   Hematological: Negative.   Psychiatric/Behavioral: Negative.     Past Medical History:  Diagnosis Date  . Anxiety   . Depression   . Family history of breast cancer   . Family history of liver cancer   . Family history of lung cancer   . Headache    migraines  . History of kidney stones     Past Surgical History:  Procedure Laterality Date  . CESAREAN SECTION    . OTHER SURGICAL HISTORY    . PORTACATH PLACEMENT Left 04/01/2018   Procedure:  INSERTION PORT-A-CATH;  Surgeon: [name], MD;  Location: [facility];  Service: General;  Laterality: Left;      Current Outpatient Medications:  .  ibuprofen (ADVIL,MOTRIN) 200 MG tablet, Take 200 mg by mouth every 6 (six) hours as needed., Disp: , Rfl:  .  levofloxacin (LEVAQUIN) 500 MG tablet, Take 1 tablet (500 mg total) by mouth daily. (Patient not taking: Reported on 09/21/2018), Disp: 7 tablet, Rfl: 0 .  lidocaine-prilocaine (EMLA) cream, Apply to affected area once, Disp: 30 g, Rfl: 1 .  magic mouthwash SOLN, Take 5 mLs by mouth 4 (four) times daily as needed for mouth pain., Disp: 240 mL, Rfl: 0 .  triamcinolone (KENALOG) 0.025 % ointment, Apply 1 application topically 2 (two) times daily., Disp: 30 g, Rfl: 1 .  venlafaxine XR (EFFEXOR-XR) 37.5 MG 24 hr capsule, Take 1 capsule (37.5 mg total) by mouth daily with breakfast., Disp: 30 capsule, Rfl: 6   Objective:   Vitals:   09/21/18 0943  BP: 117/83  Pulse: 83  Temp: 98.7 F (37.1 C)  SpO2: 98%    Physical Exam Vitals signs and nursing note reviewed.  Constitutional:      Appearance: Normal appearance.  HENT:     Head: Normocephalic and atraumatic.     Nose: Nose normal.     Mouth/Throat:     Mouth: Mucous membranes are moist.  Eyes:     Extraocular Movements: Extraocular movements intact.     Pupils: Pupils are equal, round, and reactive to light.  Neck:     Musculoskeletal: Normal range of motion.  Cardiovascular:     Rate and Rhythm: Normal rate and regular rhythm.     Pulses: Normal pulses.  Pulmonary:     Effort: Pulmonary effort is normal. No respiratory distress.  Abdominal:     General: Abdomen is flat. There is no distension.     Tenderness: There is no abdominal tenderness.  Musculoskeletal: Normal range of motion.  Skin:    General: Skin is warm.     Capillary Refill: Capillary refill takes less than 2 seconds.  Neurological:     General: No focal deficit present.     Mental  Status: She is alert.  Psychiatric:        Mood and Affect: Mood normal.        Behavior: Behavior normal.        Thought Content: Thought content normal.        Judgment: Judgment normal.     Assessment & Plan:  BRCA2 gene mutation positive  Malignant neoplasm of upper-outer quadrant of left breast in female, estrogen receptor negative ([place])  Assessment and Plan:  A long, detailed conversation was had regarding the patient's options for breast reconstruction. Five main points, which are explained to all breast reconstruction patients, were discussed.  1. Breast reconstruction is an optional process.  2. Breast reconstruction is a multi-stage process which involves multiple surgeries spaced several months apart. The entire process can take over one year.  3. The major goal of breast reconstruction is to have the patient look normal in clothing. When naked, there will always be scars.  4. Asymmetries are often present during the reconstruction process. Several operations may be needed, including surgery to the non-cancerous breast, to achieve satisfactory results.  5. No matter the reconstructive method, there are ways that the reconstruction can fail and a secondary reconstructive plan would need to be created.   A general discussion regarding all available methods of breast reconstruction were discussed. The types of reconstructions described included.  1. Tissue expander and implant based reconstruction, both single and multi-stage approaches.  2. Autologous only reconstructions, including free abdominal-tissue based reconstructions.  3. Combination procedures, particularly latissismus dorsi flaps combined with either expanders or implants.  For each of the reconstruction methods mentioned above, the risks, benefits, alternatives, scarring, and recovery time were discussed in great detail. Specific risks detailed included bleeding, infection, hematoma, seroma, scarring, pain, wound  healing complications, flap loss, fat necrosis, capsular contracture, need for implant removal, donor site complications, bulge, hernia, umbilical necrosis, need for urgent reoperation, and need for dressing changes were discussed.   Assessment  Once all reconstruction options were presented, a focused discussion was had regarding the patient's suitability for each of these procedures.  A total of 50 minutes of face-to-face time was spent in this encounter, of which >50% was spent in counseling.   The patient was given all options as described above. She would like to have immediate reconstruction with expanders and Flex HD.  This will be a staged procedure and will have the exchange to implant ~ 3 months after the first surgery.  Expanders ordered.  All info discussed with Dr. Barry Dienes.

## 2018-09-21 NOTE — Progress Notes (Signed)
Patient ID: Samantha Mcneil, female    DOB: [birth year], 33 y.o.   MRN: [id number]   Chief Complaint  Patient presents with  . Breast Problem    The patient is a 33 yrs old female here for consultation for breast reconstruction.  She noted a mass on the left breast which lead to the diagnosis of invasive carcinoma of LEFT upper outer quadrant that is Estrogen and Progesterone negative and Her-2 negative. The lymph nodes have been negative.  She had an MRI that which confirmed no lymphadenopathy and a negative right breast.  She is BRCA-2 positive.  She is 5 feet 4 inches tall and 154 pounds.  She is a 35 B and would like to be around the same size.  She finished her chemo in February (Adriamycin/Cytoxan and Abraxane and Carboplatin).  She is wanting to have bilateral mastectomies.  She is not a smoker.  She has a very positive family history of breast cancer.  She is married and has to children.  They are not planning on having any more children.   Review of Systems  Constitutional: Negative.  Negative for activity change and appetite change.  HENT: Negative.  Negative for congestion.   Eyes: Negative.   Respiratory: Negative.  Negative for chest tightness.   Cardiovascular: Negative.  Negative for leg swelling.  Gastrointestinal: Negative.  Negative for abdominal distention and abdominal pain.  Endocrine: Negative.   Genitourinary: Negative.   Musculoskeletal: Negative.  Negative for back pain and myalgias.  Neurological: Negative.   Hematological: Negative.   Psychiatric/Behavioral: Negative.     Past Medical History:  Diagnosis Date  . Anxiety   . Depression   . Family history of breast cancer   . Family history of liver cancer   . Family history of lung cancer   . Headache    migraines  . History of kidney stones     Past Surgical History:  Procedure Laterality Date  . CESAREAN SECTION    . OTHER SURGICAL HISTORY    . PORTACATH PLACEMENT Left 04/01/2018   Procedure:  INSERTION PORT-A-CATH;  Surgeon: [name], MD;  Location: [facility];  Service: General;  Laterality: Left;      Current Outpatient Medications:  .  ibuprofen (ADVIL,MOTRIN) 200 MG tablet, Take 200 mg by mouth every 6 (six) hours as needed., Disp: , Rfl:  .  levofloxacin (LEVAQUIN) 500 MG tablet, Take 1 tablet (500 mg total) by mouth daily. (Patient not taking: Reported on 09/21/2018), Disp: 7 tablet, Rfl: 0 .  lidocaine-prilocaine (EMLA) cream, Apply to affected area once, Disp: 30 g, Rfl: 1 .  magic mouthwash SOLN, Take 5 mLs by mouth 4 (four) times daily as needed for mouth pain., Disp: 240 mL, Rfl: 0 .  triamcinolone (KENALOG) 0.025 % ointment, Apply 1 application topically 2 (two) times daily., Disp: 30 g, Rfl: 1 .  venlafaxine XR (EFFEXOR-XR) 37.5 MG 24 hr capsule, Take 1 capsule (37.5 mg total) by mouth daily with breakfast., Disp: 30 capsule, Rfl: 6   Objective:   Vitals:   09/21/18 0943  BP: 117/83  Pulse: 83  Temp: 98.7 F (37.1 C)  SpO2: 98%    Physical Exam Vitals signs and nursing note reviewed.  Constitutional:      Appearance: Normal appearance.  HENT:     Head: Normocephalic and atraumatic.     Nose: Nose normal.     Mouth/Throat:     Mouth: Mucous membranes are moist.  Eyes:     Extraocular Movements: Extraocular movements intact.     Pupils: Pupils are equal, round, and reactive to light.  Neck:     Musculoskeletal: Normal range of motion.  Cardiovascular:     Rate and Rhythm: Normal rate and regular rhythm.     Pulses: Normal pulses.  Pulmonary:     Effort: Pulmonary effort is normal. No respiratory distress.  Abdominal:     General: Abdomen is flat. There is no distension.     Tenderness: There is no abdominal tenderness.  Musculoskeletal: Normal range of motion.  Skin:    General: Skin is warm.     Capillary Refill: Capillary refill takes less than 2 seconds.  Neurological:     General: No focal deficit present.     Mental  Status: She is alert.  Psychiatric:        Mood and Affect: Mood normal.        Behavior: Behavior normal.        Thought Content: Thought content normal.        Judgment: Judgment normal.     Assessment & Plan:  BRCA2 gene mutation positive  Malignant neoplasm of upper-outer quadrant of left breast in female, estrogen receptor negative ([place])  Assessment and Plan:  A long, detailed conversation was had regarding the patient's options for breast reconstruction. Five main points, which are explained to all breast reconstruction patients, were discussed.  1. Breast reconstruction is an optional process.  2. Breast reconstruction is a multi-stage process which involves multiple surgeries spaced several months apart. The entire process can take over one year.  3. The major goal of breast reconstruction is to have the patient look normal in clothing. When naked, there will always be scars.  4. Asymmetries are often present during the reconstruction process. Several operations may be needed, including surgery to the non-cancerous breast, to achieve satisfactory results.  5. No matter the reconstructive method, there are ways that the reconstruction can fail and a secondary reconstructive plan would need to be created.   A general discussion regarding all available methods of breast reconstruction were discussed. The types of reconstructions described included.  1. Tissue expander and implant based reconstruction, both single and multi-stage approaches.  2. Autologous only reconstructions, including free abdominal-tissue based reconstructions.  3. Combination procedures, particularly latissismus dorsi flaps combined with either expanders or implants.  For each of the reconstruction methods mentioned above, the risks, benefits, alternatives, scarring, and recovery time were discussed in great detail. Specific risks detailed included bleeding, infection, hematoma, seroma, scarring, pain, wound  healing complications, flap loss, fat necrosis, capsular contracture, need for implant removal, donor site complications, bulge, hernia, umbilical necrosis, need for urgent reoperation, and need for dressing changes were discussed.   Assessment  Once all reconstruction options were presented, a focused discussion was had regarding the patient's suitability for each of these procedures.  A total of 50 minutes of face-to-face time was spent in this encounter, of which >50% was spent in counseling.   The patient was given all options as described above. She would like to have immediate reconstruction with expanders and Flex HD.  This will be a staged procedure and will have the exchange to implant ~ 3 months after the first surgery.  Expanders ordered.  All info discussed with Dr. Barry Dienes.

## 2018-09-28 MED FILL — VENLAFAXINE HCL ER 37.5 MG: 37.5 | 30 days supply | Qty: 30 | Fill #6

## 2018-10-05 ENCOUNTER — Other Ambulatory Visit: Payer: Self-pay

## 2018-10-05 ENCOUNTER — Encounter (HOSPITAL_BASED_OUTPATIENT_CLINIC_OR_DEPARTMENT_OTHER): Payer: Self-pay | Admitting: *Deleted

## 2018-10-09 ENCOUNTER — Other Ambulatory Visit: Payer: Self-pay

## 2018-10-09 ENCOUNTER — Ambulatory Visit
Admission: RE | Admit: 2018-10-09 | Discharge: 2018-10-09 | Disposition: A | Discharge status: Home / Self Care | Payer: BLUE CROSS/BLUE SHIELD | Source: Ambulatory Visit | Attending: General Surgery | Admitting: General Surgery

## 2018-10-09 ENCOUNTER — Other Ambulatory Visit: Payer: Self-pay | Admitting: General Surgery

## 2018-10-09 DIAGNOSIS — C50412 Malignant neoplasm of upper-outer quadrant of left female breast: Secondary | ICD-10-CM

## 2018-10-12 ENCOUNTER — Other Ambulatory Visit: Payer: BLUE CROSS/BLUE SHIELD

## 2018-10-12 ENCOUNTER — Encounter (HOSPITAL_BASED_OUTPATIENT_CLINIC_OR_DEPARTMENT_OTHER)
Admission: RE | Admit: 2018-10-12 | Discharge: 2018-10-12 | Disposition: A | Discharge status: Home / Self Care | Payer: BLUE CROSS/BLUE SHIELD | Source: Ambulatory Visit | Attending: General Surgery | Admitting: General Surgery

## 2018-10-12 ENCOUNTER — Encounter (HOSPITAL_BASED_OUTPATIENT_CLINIC_OR_DEPARTMENT_OTHER): Payer: Self-pay | Admitting: Anesthesiology

## 2018-10-12 DIAGNOSIS — Z01812 Encounter for preprocedural laboratory examination: Secondary | ICD-10-CM | POA: Insufficient documentation

## 2018-10-12 LAB — POCT PREGNANCY, URINE: Preg Test, Ur: NEGATIVE

## 2018-10-12 LAB — URINALYSIS, COMPLETE (UACMP) WITH MICROSCOPIC
Bacteria, UA: NONE SEEN
Bilirubin Urine: NEGATIVE
Glucose, UA: 50 mg/dL — AB
Ketones, ur: NEGATIVE mg/dL
Leukocytes,Ua: NEGATIVE
Nitrite: NEGATIVE
Protein, ur: NEGATIVE mg/dL
Specific Gravity, Urine: 1.014 (ref 1.005–1.030)
pH: 6 (ref 5.0–8.0)

## 2018-10-12 NOTE — H&P (Signed)
Samantha Mcneil Location: [facility] Patient #: [id number] DOB: 1985/08/11 Married / Language: English / Race: Refused to Report/Unreported Female   History of Present Illness The patient is a 33 year old female who presents for a follow-up for Breast cancer. Pt is a 33 yo F referred by Dr. Derrel Nip for a new diagnosis of left breast cancer 03/2018. She presented with self detected palpable masses bilaterally. She also describes a full and heavy feeling with some soreness. Diagnostic mammogram showed no findings on right, ultrasound was not done originally, but then was done and showed no abnormality. Left diagnostic imaging showed 1.7 cm calcs in the UOQ and u/s showed a 2.3 cm mass in the region of the calcs as well as a borderline enlarged LN. The left side underwent core needle biopsy and showed grade 3 invasive ductal carcinoma with DCIS. The lymph node was negative. Prognostic panel was triple negative, with Ki 67 of 90%. The lymph node biopsy was negative but felt to be discordant.   She has multiple family members who have had breast cancer. Her maternal aunt had breast cancer, did not have treatment, and died young. A maternal cousin (not this aunt's daughter) had breast cancer as well. Her mother had a history of colon polyps. She had menarche at age 27. She is a G2P2 wtih first child born in late teens.   She has received neoadjuvant chemo and has had AC plus TC. BRCA2 mutation was found.   She desires bilateral mastectomies.      MRI post chemo 09/02/2018 TECHNIQUE: Multiplanar, multisequence MR images of both breasts were obtained prior to and following the intravenous administration of 7 ml of Gadavist  Three-dimensional MR images were rendered by post-processing of the original MR data on an independent workstation. The three-dimensional MR images were interpreted, and findings are reported in the following complete MRI report for this study.  Three dimensional images were evaluated at the independent DynaCad workstation  COMPARISON: 03/30/2018 and earlier  FINDINGS: Breast composition: c. Heterogeneous fibroglandular tissue.  Background parenchymal enhancement: Minimal  Right breast: No mass or abnormal enhancement.  Left breast: A tissue marker clip is identified in the UPPER central portion of the LEFT breast, marking the site of known malignancy. There has been complete resolution of mass and abnormal enhancement in this region. No new sites of concern in the LEFT breast.  Lymph nodes: No abnormal appearing lymph nodes.  Ancillary findings: None.  IMPRESSION: Interval resolution of mass and enhancement in the UPPER central portion of the LEFT breast.  No new areas of concern.  RECOMMENDATION: Treatment plan for known malignancy.  BI-RADS CATEGORY 6: Known biopsy-proven malignancy.  Diagnosis 03/12/2018 1. Breast, left, needle core biopsy, 12 o'clock, 2cmfn - INVASIVE DUCTAL CARCINOMA, GRADE III. SEE NOTE. - DUCTAL CARCINOMA IN SITU, HIGH NUCLEAR GRADE. 2. Lymph node, needle/core biopsy, left axillary - BENIGN LYMPH NODE. - NEGATIVE FOR CARCINOMA.    Allergies No Known Drug Allergies [03/16/2018]: Allergies Reconciled   Medication History Venlafaxine HCl ER (37.5MG Capsule ER 24HR, Oral) Active. No Current Medications Medications Reconciled    Review of Systems All other systems negative  Vitals Weight: 153.5 lb Height: 63in Body Surface Area: 1.73 m Body Mass Index: 27.19 kg/m  Temp.: 97.38F(Temporal)  Pulse: 105 (Regular)  P.OX: 99% (Room air) BP: 124/84 (Sitting, Left Arm, Standard)       Physical Exam  General Mental Status-Alert. General Appearance-Consistent with stated age. Hydration-Well hydrated. Voice-Normal.  Head and Neck Head-normocephalic, atraumatic  with no lesions or palpable masses.  Eye Sclera/Conjunctiva - Bilateral-No  scleral icterus.  Chest and Lung Exam Chest and lung exam reveals -quiet, even and easy respiratory effort with no use of accessory muscles. Inspection Chest Wall - Normal. Back - normal.  Breast Note: no palpable mass. no skin changes. no LAD. small equal breasts wtih no ptosis seen.   Cardiovascular Cardiovascular examination reveals -normal pedal pulses bilaterally. Note: regular rate and rhythm  Abdomen Inspection-Inspection Normal. Palpation/Percussion Palpation and Percussion of the abdomen reveal - Soft, Non Tender, No Rebound tenderness, No Rigidity (guarding) and No hepatosplenomegaly.  Peripheral Vascular Upper Extremity Inspection - Bilateral - Normal - No Clubbing, No Cyanosis, No Edema, Pulses Intact. Lower Extremity Palpation - Edema - Bilateral - No edema.  Neurologic Neurologic evaluation reveals -alert and oriented x 3 with no impairment of recent or remote memory. Mental Status-Normal.  Musculoskeletal Global Assessment -Note: no gross deformities.  Normal Exam - Left-Upper Extremity Strength Normal and Lower Extremity Strength Normal. Normal Exam - Right-Upper Extremity Strength Normal and Lower Extremity Strength Normal.  Lymphatic Head & Neck  General Head & Neck Lymphatics: Bilateral - Description - Normal. Axillary  General Axillary Region: Bilateral - Description - Normal. Tenderness - Non Tender.    Assessment & Plan  (PRIMARY CANCER OF UPPER OUTER QUADRANT OF LEFT FEMALE BREAST (C50.412) Impression: Patient is a 33 year old female with clinical T2N0 triple negative left breast cancer in the upper outer quadrant who has had neoadjuvant chemotherapy. Since her diagnosis, she has subsequently been identified to have a BRCA2 defect. We will plan bilateral mastectomies. I think that she is a candidate for nipple sparing mastectomies. I will refer her to plastic surgery to assess this and to discuss what type of reconstruction to  perform.  The original lymph node biopsy was felt to be discordant. Because of this, I will ask radiology to place a seed in the biopsy lymph node and also do a sentinel lymph node biopsy with both dyes.  I discussed the procedure with the patient including location of incisions. I think will we will be able to go inframammary. I discussed that the left side would need a sentinel node incision site. I discussed risk of bleeding, infection, wound problems, possible loss of the nipple, possible longer hospital stay. Possible need for additional surgeries. Patient will see plastic surgery today.  With the original size of the tumor being 2.3 cm and likely negative lymph nodes, she will most likely not need radiation. If her nodes are positive or if there are positive margins, this would affect this decision. We will review her pathology in multidisciplinary conference postop. BRCA2 POSITIVE (Z15.01) Impression: See above. Current Plans Referred to Surgery - Plastic, for evaluation and follow up (Plastic Surgery). Routine. You are being scheduled for surgery- Our schedulers will call you.  You should hear from our office's scheduling department within 5 working days about the location, date, and time of surgery. We try to make accommodations for patient's preferences in scheduling surgery, but sometimes the OR schedule or the surgeon's schedule prevents [place] from making those accommodations.  If you have not heard from our office [phone]) in 5 working days, call the office and ask for your surgeon's nurse.  If you have other questions about your diagnosis, plan, or surgery, call the office and ask for your surgeon's nurse.  Pt Education - [facility] Mastectomy [facility]   Signed by [name], MD

## 2018-10-12 NOTE — Anesthesia Preprocedure Evaluation (Addendum)
Anesthesia Evaluation  Patient identified by MRN, date of birth, ID band Patient awake    Reviewed: Allergy & Precautions, NPO status , Patient's Chart, lab work & pertinent test results  Airway Mallampati: I       Dental no notable dental hx. (+) Teeth Intact   Pulmonary    Pulmonary exam normal breath sounds clear to auscultation       Cardiovascular negative cardio ROS Normal cardiovascular exam Rhythm:Regular Rate:Normal     Neuro/Psych  Headaches, PSYCHIATRIC DISORDERS Anxiety Depression    GI/Hepatic negative GI ROS, Neg liver ROS,   Endo/Other  negative endocrine ROS  Renal/GU negative Renal ROS  negative genitourinary   Musculoskeletal negative musculoskeletal ROS (+)   Abdominal Normal abdominal exam  (+)   Peds  Hematology negative hematology ROS (+)   Anesthesia Other Findings Result status: Final result                             *[place]*                  *[facility] [address].                        [place], [place] [address]                            [phone]  ------------------------------------------------------------------- Transthoracic Echocardiography  Patient:    Kesley, Mullens MR #:       [id number] Study Date: 03/24/2018 Gender:     F Age:        32 Height:     157.5 cm Weight:     62.5 kg BSA:        1.67 m^2 Pt. Status: Room:   PERFORMING   [facility], Outpatient  ATTENDING    Nicholas Lose [address], [place]  [place], [place]  SONOGRAPHER  Haroldine Laws  cc:  ------------------------------------------------------------------- LV EF: 60% -   65%  ------------------------------------------------------------------- Indications:      V58    Reproductive/Obstetrics negative OB ROS                            Anesthesia Physical Anesthesia  Plan  ASA: II  Anesthesia Plan: General   Post-op Pain Management:  Regional for Post-op pain   Induction: Intravenous  PONV Risk Score and Plan: 3 and Ondansetron, Dexamethasone, Scopolamine patch - Pre-op and Midazolam  Airway Management Planned: Oral ETT  Additional Equipment:   Intra-op Plan:   Post-operative Plan: Extubation in OR  Informed Consent: I have reviewed the patients History and Physical, chart, labs and discussed the procedure including the risks, benefits and alternatives for the proposed anesthesia with the patient or authorized representative who has indicated his/her understanding and acceptance.     Dental advisory given  Plan Discussed with: CRNA  Anesthesia Plan Comments:        Anesthesia Quick Evaluation

## 2018-10-13 ENCOUNTER — Other Ambulatory Visit: Payer: Self-pay

## 2018-10-13 ENCOUNTER — Inpatient Hospital Stay: Admission: RE | Admit: 2018-10-13 | Payer: BLUE CROSS/BLUE SHIELD | Source: Ambulatory Visit

## 2018-10-13 ENCOUNTER — Ambulatory Visit (HOSPITAL_BASED_OUTPATIENT_CLINIC_OR_DEPARTMENT_OTHER): Payer: BLUE CROSS/BLUE SHIELD | Admitting: Anesthesiology

## 2018-10-13 ENCOUNTER — Ambulatory Visit (HOSPITAL_BASED_OUTPATIENT_CLINIC_OR_DEPARTMENT_OTHER)
Admission: RE | Admit: 2018-10-13 | Discharge: 2018-10-14 | Disposition: A | Discharge status: Home / Self Care | Payer: BLUE CROSS/BLUE SHIELD | Attending: Plastic Surgery | Admitting: Plastic Surgery

## 2018-10-13 ENCOUNTER — Encounter (HOSPITAL_COMMUNITY)
Admission: RE | Admit: 2018-10-13 | Discharge: 2018-10-13 | Disposition: A | Discharge status: Home / Self Care | Payer: BLUE CROSS/BLUE SHIELD | Source: Ambulatory Visit | Attending: General Surgery | Admitting: General Surgery

## 2018-10-13 ENCOUNTER — Encounter (HOSPITAL_BASED_OUTPATIENT_CLINIC_OR_DEPARTMENT_OTHER): Payer: Self-pay

## 2018-10-13 ENCOUNTER — Encounter (HOSPITAL_COMMUNITY): Payer: BLUE CROSS/BLUE SHIELD

## 2018-10-13 ENCOUNTER — Encounter (HOSPITAL_BASED_OUTPATIENT_CLINIC_OR_DEPARTMENT_OTHER)
Admission: RE | Disposition: A | Discharge status: Home / Self Care | Payer: Self-pay | Source: Home / Self Care | Attending: Plastic Surgery

## 2018-10-13 DIAGNOSIS — C50912 Malignant neoplasm of unspecified site of left female breast: Secondary | ICD-10-CM

## 2018-10-13 DIAGNOSIS — Z171 Estrogen receptor negative status [ER-]: Secondary | ICD-10-CM | POA: Insufficient documentation

## 2018-10-13 DIAGNOSIS — Z808 Family history of malignant neoplasm of other organs or systems: Secondary | ICD-10-CM | POA: Insufficient documentation

## 2018-10-13 DIAGNOSIS — C50412 Malignant neoplasm of upper-outer quadrant of left female breast: Secondary | ICD-10-CM

## 2018-10-13 DIAGNOSIS — Z801 Family history of malignant neoplasm of trachea, bronchus and lung: Secondary | ICD-10-CM | POA: Diagnosis not present

## 2018-10-13 DIAGNOSIS — Z1501 Genetic susceptibility to malignant neoplasm of breast: Secondary | ICD-10-CM | POA: Insufficient documentation

## 2018-10-13 DIAGNOSIS — Z803 Family history of malignant neoplasm of breast: Secondary | ICD-10-CM | POA: Diagnosis not present

## 2018-10-13 DIAGNOSIS — Z79899 Other long term (current) drug therapy: Secondary | ICD-10-CM | POA: Insufficient documentation

## 2018-10-13 DIAGNOSIS — F329 Major depressive disorder, single episode, unspecified: Secondary | ICD-10-CM | POA: Diagnosis not present

## 2018-10-13 DIAGNOSIS — Z9221 Personal history of antineoplastic chemotherapy: Secondary | ICD-10-CM | POA: Diagnosis not present

## 2018-10-13 DIAGNOSIS — F419 Anxiety disorder, unspecified: Secondary | ICD-10-CM | POA: Insufficient documentation

## 2018-10-13 DIAGNOSIS — C50919 Malignant neoplasm of unspecified site of unspecified female breast: Secondary | ICD-10-CM | POA: Diagnosis present

## 2018-10-13 HISTORY — PX: BREAST RECONSTRUCTION WITH PLACEMENT OF TISSUE EXPANDER AND FLEX HD (ACELLULAR HYDRATED DERMIS): SHX6295

## 2018-10-13 HISTORY — PX: MASTECTOMY W/ SENTINEL NODE BIOPSY: SHX2001

## 2018-10-13 SURGERY — MASTECTOMY WITH SENTINEL LYMPH NODE BIOPSY
Anesthesia: General | Site: Breast | Laterality: Bilateral

## 2018-10-13 MED ORDER — PROPOFOL 10 MG/ML IV BOLUS
INTRAVENOUS | Status: AC
  Filled 2018-10-13: qty 20

## 2018-10-13 MED ORDER — SCOPOLAMINE 1 MG/3DAYS TD PT72: 1.0000 | MEDICATED_PATCH | TRANSDERMAL | Status: AC

## 2018-10-13 MED ORDER — DIAZEPAM 2 MG PO TABS: 2.0000 mg | ORAL_TABLET | Freq: Two times a day (BID) | ORAL | Status: DC | PRN

## 2018-10-13 MED ORDER — GABAPENTIN 300 MG PO CAPS
ORAL_CAPSULE | ORAL | Status: AC
  Filled 2018-10-13: qty 1

## 2018-10-13 MED ORDER — KETOROLAC TROMETHAMINE 30 MG/ML IJ SOLN: 30.0000 mg | Freq: Once | INTRAMUSCULAR | Status: AC | PRN

## 2018-10-13 MED ORDER — MIDAZOLAM HCL 2 MG/2ML IJ SOLN
INTRAMUSCULAR | Status: AC
  Filled 2018-10-13: qty 2

## 2018-10-13 MED ORDER — DEXAMETHASONE SODIUM PHOSPHATE 4 MG/ML IJ SOLN
INTRAMUSCULAR | Status: DC | PRN
  Administered 2018-10-13: 10 mg via INTRAVENOUS

## 2018-10-13 MED ORDER — ENSURE PRE-SURGERY PO LIQD: 296.0000 mL | Freq: Once | ORAL | Status: DC

## 2018-10-13 MED ORDER — PROMETHAZINE HCL 25 MG/ML IJ SOLN: 6.2500 mg | INTRAMUSCULAR | Status: DC | PRN

## 2018-10-13 MED ORDER — ONDANSETRON HCL 4 MG/2ML IJ SOLN: 4.0000 mg | Freq: Four times a day (QID) | INTRAMUSCULAR | Status: DC | PRN

## 2018-10-13 MED ORDER — METHYLENE BLUE 0.5 % INJ SOLN
INTRAVENOUS | Status: AC
  Filled 2018-10-13: qty 10

## 2018-10-13 MED ORDER — SCOPOLAMINE 1 MG/3DAYS TD PT72
1.0000 | MEDICATED_PATCH | Freq: Once | TRANSDERMAL | Status: AC | PRN
  Administered 2018-10-13: 1 via TRANSDERMAL

## 2018-10-13 MED ORDER — FENTANYL CITRATE (PF) 100 MCG/2ML IJ SOLN
INTRAMUSCULAR | Status: AC
  Filled 2018-10-13: qty 2

## 2018-10-13 MED ORDER — METHYLENE BLUE 0.5 % INJ SOLN
INTRAVENOUS | Status: DC | PRN
  Administered 2018-10-13: 4 mL via INTRADERMAL

## 2018-10-13 MED ORDER — CEFAZOLIN SODIUM-DEXTROSE 2-4 GM/100ML-% IV SOLN: 2.0000 g | INTRAVENOUS | Status: AC

## 2018-10-13 MED ORDER — CHLORHEXIDINE GLUCONATE CLOTH 2 % EX PADS: 6.0000 | MEDICATED_PAD | Freq: Once | CUTANEOUS | Status: DC

## 2018-10-13 MED ORDER — GABAPENTIN 300 MG PO CAPS
300.0000 mg | ORAL_CAPSULE | ORAL | Status: AC
  Administered 2018-10-13: 300 mg via ORAL

## 2018-10-13 MED ORDER — HYDROMORPHONE HCL 1 MG/ML IJ SOLN
0.2500 mg | INTRAMUSCULAR | Status: DC | PRN
  Administered 2018-10-13: 15:00:00 0.5 mg via INTRAVENOUS

## 2018-10-13 MED ORDER — MIDAZOLAM HCL 2 MG/2ML IJ SOLN
1.0000 mg | INTRAMUSCULAR | Status: DC | PRN
  Administered 2018-10-13: 1 mg via INTRAVENOUS
  Administered 2018-10-13: 07:00:00 2 mg via INTRAVENOUS

## 2018-10-13 MED ORDER — POLYETHYLENE GLYCOL 3350 17 G PO PACK: 17.0000 g | PACK | Freq: Every day | ORAL | Status: DC | PRN

## 2018-10-13 MED ORDER — DIPHENHYDRAMINE HCL 12.5 MG/5ML PO ELIX: 12.5000 mg | ORAL_SOLUTION | Freq: Four times a day (QID) | ORAL | Status: DC | PRN

## 2018-10-13 MED ORDER — MEPERIDINE HCL 25 MG/ML IJ SOLN: 6.2500 mg | INTRAMUSCULAR | Status: DC | PRN

## 2018-10-13 MED ORDER — ONDANSETRON HCL 4 MG/2ML IJ SOLN
INTRAMUSCULAR | Status: DC | PRN
  Administered 2018-10-13: 4 mg via INTRAVENOUS

## 2018-10-13 MED ORDER — SUCCINYLCHOLINE CHLORIDE 20 MG/ML IJ SOLN
INTRAMUSCULAR | Status: DC | PRN
  Administered 2018-10-13: 120 mg via INTRAVENOUS

## 2018-10-13 MED ORDER — BUPIVACAINE HCL (PF) 0.25 % IJ SOLN
INTRAMUSCULAR | Status: AC
  Filled 2018-10-13: qty 30

## 2018-10-13 MED ORDER — ACETAMINOPHEN 325 MG PO TABS
325.0000 mg | ORAL_TABLET | Freq: Four times a day (QID) | ORAL | Status: DC
  Administered 2018-10-13 – 2018-10-14 (×3): 325 mg via ORAL
  Filled 2018-10-13 (×3): qty 1

## 2018-10-13 MED ORDER — CELECOXIB 200 MG PO CAPS
200.0000 mg | ORAL_CAPSULE | ORAL | Status: AC
  Administered 2018-10-13: 200 mg via ORAL

## 2018-10-13 MED ORDER — SUCCINYLCHOLINE CHLORIDE 200 MG/10ML IV SOSY
PREFILLED_SYRINGE | INTRAVENOUS | Status: AC
  Filled 2018-10-13: qty 10

## 2018-10-13 MED ORDER — CEFAZOLIN SODIUM-DEXTROSE 2-4 GM/100ML-% IV SOLN
2.0000 g | INTRAVENOUS | Status: AC
  Administered 2018-10-13: 2 g via INTRAVENOUS

## 2018-10-13 MED ORDER — BUPIVACAINE HCL (PF) 0.5 % IJ SOLN
INTRAMUSCULAR | Status: DC | PRN
  Administered 2018-10-13 (×9): 5 mL via PERINEURAL
  Administered 2018-10-13: 5 mL

## 2018-10-13 MED ORDER — CEFAZOLIN SODIUM-DEXTROSE 2-4 GM/100ML-% IV SOLN
2.0000 g | Freq: Three times a day (TID) | INTRAVENOUS | Status: DC
  Administered 2018-10-13 – 2018-10-14 (×2): 2 g via INTRAVENOUS
  Filled 2018-10-13 (×2): qty 100

## 2018-10-13 MED ORDER — PROPOFOL 10 MG/ML IV BOLUS
INTRAVENOUS | Status: DC | PRN
  Administered 2018-10-13: 140 mg via INTRAVENOUS
  Administered 2018-10-13: 30 mg via INTRAVENOUS
  Administered 2018-10-13: 60 mg via INTRAVENOUS

## 2018-10-13 MED ORDER — LIDOCAINE 2% (20 MG/ML) 5 ML SYRINGE
INTRAMUSCULAR | Status: AC
  Filled 2018-10-13: qty 5

## 2018-10-13 MED ORDER — BUPIVACAINE LIPOSOME 1.3 % IJ SUSP
INTRAMUSCULAR | Status: DC | PRN
  Administered 2018-10-13 (×4): 2.5 mL via PERINEURAL

## 2018-10-13 MED ORDER — SODIUM CHLORIDE 0.9 % IV SOLN
INTRAVENOUS | Status: AC | PRN
  Administered 2018-10-13: 1000 mL

## 2018-10-13 MED ORDER — DEXAMETHASONE SODIUM PHOSPHATE 10 MG/ML IJ SOLN
INTRAMUSCULAR | Status: AC
  Filled 2018-10-13: qty 1

## 2018-10-13 MED ORDER — EPHEDRINE SULFATE 50 MG/ML IJ SOLN
INTRAMUSCULAR | Status: DC | PRN
  Administered 2018-10-13 (×3): 10 mg via INTRAVENOUS
  Administered 2018-10-13: 15 mg via INTRAVENOUS

## 2018-10-13 MED ORDER — ONDANSETRON 4 MG PO TBDP: 4.0000 mg | ORAL_TABLET | Freq: Four times a day (QID) | ORAL | Status: DC | PRN

## 2018-10-13 MED ORDER — SODIUM CHLORIDE 0.9 % IV SOLN
INTRAVENOUS | Status: DC | PRN
  Administered 2018-10-13: 500 mL

## 2018-10-13 MED ORDER — HYDROMORPHONE HCL 1 MG/ML IJ SOLN
INTRAMUSCULAR | Status: AC
  Filled 2018-10-13: qty 0.5

## 2018-10-13 MED ORDER — FENTANYL CITRATE (PF) 100 MCG/2ML IJ SOLN
50.0000 ug | INTRAMUSCULAR | Status: AC | PRN
  Administered 2018-10-13 (×2): 100 ug via INTRAVENOUS
  Administered 2018-10-13 (×2): 50 ug via INTRAVENOUS

## 2018-10-13 MED ORDER — LACTATED RINGERS IV SOLN
INTRAVENOUS | Status: DC
  Administered 2018-10-13 (×5): via INTRAVENOUS

## 2018-10-13 MED ORDER — ONDANSETRON HCL 4 MG/2ML IJ SOLN
INTRAMUSCULAR | Status: AC
  Filled 2018-10-13: qty 4

## 2018-10-13 MED ORDER — ACETAMINOPHEN 500 MG PO TABS
ORAL_TABLET | ORAL | Status: AC
  Filled 2018-10-13: qty 2

## 2018-10-13 MED ORDER — DIPHENHYDRAMINE HCL 50 MG/ML IJ SOLN: 12.5000 mg | Freq: Four times a day (QID) | INTRAMUSCULAR | Status: DC | PRN

## 2018-10-13 MED ORDER — HYDROMORPHONE HCL 1 MG/ML IJ SOLN: 1.0000 mg | INTRAMUSCULAR | Status: DC | PRN

## 2018-10-13 MED ORDER — HYDROCODONE-ACETAMINOPHEN 5-325 MG PO TABS
1.0000 | ORAL_TABLET | ORAL | Status: DC | PRN
  Administered 2018-10-13: 1 via ORAL
  Filled 2018-10-13: qty 1

## 2018-10-13 MED ORDER — SODIUM CHLORIDE 0.9 % IV SOLN
INTRAVENOUS | Status: DC | PRN
  Administered 2018-10-13: 50 ug/min via INTRAVENOUS

## 2018-10-13 MED ORDER — KCL IN DEXTROSE-NACL 20-5-0.45 MEQ/L-%-% IV SOLN
INTRAVENOUS | Status: DC
  Administered 2018-10-13 – 2018-10-14 (×2): via INTRAVENOUS
  Filled 2018-10-13 (×2): qty 1000

## 2018-10-13 MED ORDER — SENNA 8.6 MG PO TABS
1.0000 | ORAL_TABLET | Freq: Two times a day (BID) | ORAL | Status: DC
  Administered 2018-10-13: 8.6 mg via ORAL
  Filled 2018-10-13: qty 1

## 2018-10-13 MED ORDER — TECHNETIUM TC 99M SULFUR COLLOID FILTERED
1.0000 | Freq: Once | INTRAVENOUS | Status: AC | PRN
  Administered 2018-10-13: 1 via INTRADERMAL

## 2018-10-13 MED ORDER — CELECOXIB 200 MG PO CAPS
ORAL_CAPSULE | ORAL | Status: AC
  Filled 2018-10-13: qty 1

## 2018-10-13 MED ORDER — SODIUM CHLORIDE (PF) 0.9 % IJ SOLN
INTRAMUSCULAR | Status: AC
  Filled 2018-10-13: qty 10

## 2018-10-13 MED ORDER — 0.9 % SODIUM CHLORIDE (POUR BTL) OPTIME
TOPICAL | Status: DC | PRN
  Administered 2018-10-13: 2000 mL

## 2018-10-13 MED ORDER — ACETAMINOPHEN 500 MG PO TABS
1000.0000 mg | ORAL_TABLET | ORAL | Status: AC
  Administered 2018-10-13: 1000 mg via ORAL

## 2018-10-13 MED ORDER — CEFAZOLIN SODIUM-DEXTROSE 2-4 GM/100ML-% IV SOLN
INTRAVENOUS | Status: AC
  Filled 2018-10-13: qty 100

## 2018-10-13 SURGICAL SUPPLY — 106 items
BAG DECANTER FOR FLEXI CONT (MISCELLANEOUS) ×3 IMPLANT
BINDER BREAST LRG (GAUZE/BANDAGES/DRESSINGS) IMPLANT
BINDER BREAST MEDIUM (GAUZE/BANDAGES/DRESSINGS) IMPLANT
BINDER BREAST XLRG (GAUZE/BANDAGES/DRESSINGS) ×2 IMPLANT
BINDER BREAST XXLRG (GAUZE/BANDAGES/DRESSINGS) IMPLANT
BIOPATCH RED 1 DISK 7.0 (GAUZE/BANDAGES/DRESSINGS) ×3 IMPLANT
BLADE HEX COATED 2.75 (ELECTRODE) ×2 IMPLANT
BLADE SURG 10 STRL SS (BLADE) ×3 IMPLANT
BLADE SURG 15 STRL LF DISP TIS (BLADE) ×1 IMPLANT
BLADE SURG 15 STRL SS (BLADE) ×1
BNDG GAUZE ELAST 4 BULKY (GAUZE/BANDAGES/DRESSINGS) ×2 IMPLANT
CANISTER SUCT 1200ML W/VALVE (MISCELLANEOUS) ×3 IMPLANT
CHLORAPREP W/TINT 26 (MISCELLANEOUS) ×4 IMPLANT
CLIP VESOCCLUDE LG 6/CT (CLIP) ×2 IMPLANT
CLIP VESOCCLUDE MED 6/CT (CLIP) ×5 IMPLANT
CLIP VESOCCLUDE SM WIDE 6/CT (CLIP) IMPLANT
COVER BACK TABLE REUSABLE LG (DRAPES) IMPLANT
COVER MAYO STAND REUSABLE (DRAPES) ×2 IMPLANT
COVER PROBE W GEL 5X96 (DRAPES) ×2 IMPLANT
COVER WAND RF STERILE (DRAPES) IMPLANT
DECANTER SPIKE VIAL GLASS SM (MISCELLANEOUS) IMPLANT
DERMABOND ADVANCED (GAUZE/BANDAGES/DRESSINGS) ×3
DERMABOND ADVANCED .7 DNX12 (GAUZE/BANDAGES/DRESSINGS) ×1 IMPLANT
DRAIN CHANNEL 19F RND (DRAIN) ×3 IMPLANT
DRAPE LAPAROSCOPIC ABDOMINAL (DRAPES) ×1 IMPLANT
DRAPE UTILITY XL STRL (DRAPES) ×3 IMPLANT
DRSG PAD ABDOMINAL 8X10 ST (GAUZE/BANDAGES/DRESSINGS) ×3 IMPLANT
ELECT BLADE 4.0 EZ CLEAN MEGAD (MISCELLANEOUS) ×4
ELECT REM PT RETURN 9FT ADLT (ELECTROSURGICAL) ×2
ELECTRODE BLDE 4.0 EZ CLN MEGD (MISCELLANEOUS) ×1 IMPLANT
ELECTRODE REM PT RTRN 9FT ADLT (ELECTROSURGICAL) ×1 IMPLANT
EVACUATOR SILICONE 100CC (DRAIN) ×4 IMPLANT
GAUZE SPONGE 4X4 12PLY STRL (GAUZE/BANDAGES/DRESSINGS) ×2 IMPLANT
GLOVE BIO SURGEON STRL SZ 6 (GLOVE) ×2 IMPLANT
GLOVE BIO SURGEON STRL SZ 6.5 (GLOVE) ×5 IMPLANT
GLOVE BIO SURGEON STRL SZ7 (GLOVE) ×2 IMPLANT
GLOVE BIOGEL PI IND STRL 6.5 (GLOVE) ×1 IMPLANT
GLOVE BIOGEL PI IND STRL 7.0 (GLOVE) IMPLANT
GLOVE BIOGEL PI IND STRL 7.5 (GLOVE) IMPLANT
GLOVE BIOGEL PI INDICATOR 6.5 (GLOVE) ×1
GLOVE BIOGEL PI INDICATOR 7.0 (GLOVE) ×1
GLOVE BIOGEL PI INDICATOR 7.5 (GLOVE) ×4
GLOVE SURG SYN 7.5  E (GLOVE) ×2
GLOVE SURG SYN 7.5 E (GLOVE) ×2 IMPLANT
GLOVE SURG SYN 7.5 PF PI (GLOVE) IMPLANT
GOWN STRL REUS W/ TWL LRG LVL3 (GOWN DISPOSABLE) ×3 IMPLANT
GOWN STRL REUS W/ TWL XL LVL3 (GOWN DISPOSABLE) IMPLANT
GOWN STRL REUS W/TWL 2XL LVL3 (GOWN DISPOSABLE) ×2 IMPLANT
GOWN STRL REUS W/TWL LRG LVL3 (GOWN DISPOSABLE) ×3
GOWN STRL REUS W/TWL XL LVL3 (GOWN DISPOSABLE) ×1
GRAFT FLEX HD 6X16 PLIABLE (Tissue) ×2 IMPLANT
IMPL EXPANDER BREAST 535CC (Breast) IMPLANT
IMPLANT BREAST 535CC (Breast) ×2 IMPLANT
IMPLANT EXPANDER BREAST 535CC (Breast) ×2 IMPLANT
IV NS 1000ML (IV SOLUTION)
IV NS 1000ML BAXH (IV SOLUTION) IMPLANT
IV NS 500ML (IV SOLUTION)
IV NS 500ML BAXH (IV SOLUTION) ×1 IMPLANT
KIT FILL SYSTEM UNIVERSAL (SET/KITS/TRAYS/PACK) ×1 IMPLANT
LIGHT WAVEGUIDE WIDE FLAT (MISCELLANEOUS) ×1 IMPLANT
NDL 21 GA WING INFUSION (NEEDLE) IMPLANT
NDL HYPO 25X1 1.5 SAFETY (NEEDLE) ×1 IMPLANT
NDL SAFETY ECLIPSE 18X1.5 (NEEDLE) ×1 IMPLANT
NDL SPNL 18GX3.5 QUINCKE PK (NEEDLE) IMPLANT
NDL SPNL 22GX3.5 QUINCKE BK (NEEDLE) IMPLANT
NEEDLE 21 GA WING INFUSION (NEEDLE) IMPLANT
NEEDLE HYPO 18GX1.5 SHARP (NEEDLE) ×1
NEEDLE HYPO 25X1 1.5 SAFETY (NEEDLE) ×2 IMPLANT
NEEDLE SPNL 18GX3.5 QUINCKE PK (NEEDLE) IMPLANT
NEEDLE SPNL 22GX3.5 QUINCKE BK (NEEDLE) IMPLANT
NS IRRIG 1000ML POUR BTL (IV SOLUTION) ×3 IMPLANT
PACK BASIN DAY SURGERY FS (CUSTOM PROCEDURE TRAY) ×2 IMPLANT
PACK UNIVERSAL I (CUSTOM PROCEDURE TRAY) ×2 IMPLANT
PENCIL BUTTON HOLSTER BLD 10FT (ELECTRODE) ×3 IMPLANT
PIN SAFETY STERILE (MISCELLANEOUS) ×2 IMPLANT
SET ASEPTIC TRANSFER (MISCELLANEOUS) ×2 IMPLANT
SLEEVE SCD COMPRESS KNEE MED (MISCELLANEOUS) ×2 IMPLANT
SPONGE LAP 18X18 RF (DISPOSABLE) ×12 IMPLANT
STAPLER VISISTAT 35W (STAPLE) IMPLANT
STOCKINETTE IMPERVIOUS LG (DRAPES) IMPLANT
STRIP CLOSURE SKIN 1/2X4 (GAUZE/BANDAGES/DRESSINGS) ×1 IMPLANT
STRIP SUTURE WOUND CLOSURE 1/2 (SUTURE) IMPLANT
SUT ETHILON 2 0 FS 18 (SUTURE) IMPLANT
SUT MNCRL AB 4-0 PS2 18 (SUTURE) ×7 IMPLANT
SUT MON AB 3-0 SH 27 (SUTURE) ×2
SUT MON AB 3-0 SH27 (SUTURE) ×1 IMPLANT
SUT MON AB 5-0 PS2 18 (SUTURE) ×3 IMPLANT
SUT PDS 3-0 CT2 (SUTURE) ×4
SUT PDS AB 2-0 CT2 27 (SUTURE) ×7 IMPLANT
SUT PDS II 3-0 CT2 27 ABS (SUTURE) IMPLANT
SUT SILK 0 TIES 10X30 (SUTURE) ×1 IMPLANT
SUT SILK 2 0 SH (SUTURE) ×2 IMPLANT
SUT SILK 3 0 PS 1 (SUTURE) ×1 IMPLANT
SUT SILK 4 0 PS 2 (SUTURE) ×1 IMPLANT
SUT VIC AB 3-0 SH 27 (SUTURE) ×1
SUT VIC AB 3-0 SH 27X BRD (SUTURE) IMPLANT
SUT VICRYL 3-0 CR8 SH (SUTURE) IMPLANT
SUT VICRYL AB 2 0 TIE (SUTURE) ×1 IMPLANT
SUT VICRYL AB 2 0 TIES (SUTURE)
SYR BULB IRRIGATION 50ML (SYRINGE) ×2 IMPLANT
SYR CONTROL 10ML LL (SYRINGE) ×2 IMPLANT
TOWEL GREEN STERILE FF (TOWEL DISPOSABLE) ×4 IMPLANT
TUBE CONNECTING 20X1/4 (TUBING) ×3 IMPLANT
UNDERPAD 30X30 (UNDERPADS AND DIAPERS) ×4 IMPLANT
VAC PENCILS W/TUBING CLEAR (MISCELLANEOUS) ×1 IMPLANT
YANKAUER SUCT BULB TIP NO VENT (SUCTIONS) ×2 IMPLANT

## 2018-10-13 NOTE — Anesthesia Procedure Notes (Signed)
Anesthesia Regional Block: Pectoralis block   Pre-Anesthetic Checklist: ,, timeout performed, Correct Patient, Correct Site, Correct Laterality, Correct Procedure, Correct Position, site marked, Risks and benefits discussed,  Surgical consent,  Pre-op evaluation,  At surgeon's request and post-op pain management  Laterality: Left  Prep: chloraprep       Needles:  Injection technique: Single-shot  Needle Type: Echogenic Stimulator Needle     Needle Length: 10cm  Needle Gauge: 21   Needle insertion depth: 2 cm   Additional Needles:   Procedures:,,,, ultrasound used (permanent image in chart),,,,  Narrative:  Start time: 10/13/2018 7:12 AM End time: 10/13/2018 7:22 AM Injection made incrementally with aspirations every 5 mL.  Performed by: Personally  Anesthesiologist: Lyn Hollingshead, MD

## 2018-10-13 NOTE — Interval H&P Note (Signed)
History and Physical Interval Note:  10/13/2018 10:15 AM  Samantha Mcneil  has presented today for surgery, with the diagnosis of left breast cancer, brca2 mutation.  The various methods of treatment have been discussed with the patient and family. After consideration of risks, benefits and other options for treatment, the patient has consented to  Procedure(s): BILATERAL SKIN SPARING MASTECTOMIES WITH LEFT SEED TARGETED DEEP LYMPH NODE BIOPSY AND LEFT AXILLARY SENTINEL LYMPH NODE MAPPING AND BIOPSY (Bilateral) BILATERAL IMMEDIATE BREAST RECONSTRUCTION WITH PLACEMENT OF TISSUE EXPANDER AND FLEX HD (ACELLULAR HYDRATED DERMIS) (Bilateral) as a surgical intervention.  The patient's history has been reviewed, patient examined, no change in status, stable for surgery.  I have reviewed the patient's chart and labs.  Questions were answered to the patient's satisfaction.     [name]

## 2018-10-13 NOTE — Progress Notes (Signed)
Assisted Dr. Jillyn Hidden with right, left, ultrasound guided, pectoralis block. Side rails up, monitors on throughout procedure. See vital signs in flow sheet. Tolerated Procedure well.

## 2018-10-13 NOTE — Anesthesia Procedure Notes (Signed)
Procedure Name: Intubation Date/Time: 10/13/2018 7:53 AM Performed by: Maryella Shivers, CRNA Pre-anesthesia Checklist: Patient identified, Emergency Drugs available, Suction available and Patient being monitored Patient Re-evaluated:Patient Re-evaluated prior to induction Oxygen Delivery Method: Circle system utilized Preoxygenation: Pre-oxygenation with 100% oxygen Induction Type: IV induction Ventilation: Mask ventilation without difficulty Laryngoscope Size: Mac and 3 Tube type: Oral Tube size: 7.0 mm Number of attempts: 1 Airway Equipment and Method: Stylet and Oral airway Placement Confirmation: ETT inserted through vocal cords under direct vision,  positive ETCO2 and breath sounds checked- equal and bilateral Secured at: 20 cm Tube secured with: Tape Dental Injury: Teeth and Oropharynx as per pre-operative assessment

## 2018-10-13 NOTE — Transfer of Care (Signed)
Immediate Anesthesia Transfer of Care Note  Patient: Samantha Mcneil  Procedure(s) Performed: BILATERAL SKIN SPARING MASTECTOMIES WITH LEFT SEED TARGETED DEEP LYMPH NODE BIOPSY AND LEFT AXILLARY SENTINEL LYMPH NODE MAPPING AND BIOPSY (Bilateral Breast) BILATERAL IMMEDIATE BREAST RECONSTRUCTION WITH PLACEMENT OF TISSUE EXPANDER AND FLEX HD (ACELLULAR HYDRATED DERMIS) (Bilateral )  Patient Location: PACU  Anesthesia Type:GA combined with regional for post-op pain  Level of Consciousness: sedated  Airway & Oxygen Therapy: Patient Spontanous Breathing and Patient connected to face mask oxygen  Post-op Assessment: Report given to RN and Post -op Vital signs reviewed and stable  Post vital signs: Reviewed and stable  Last Vitals:  Vitals Value Taken Time  BP    Temp    Pulse 107 10/13/2018  1:17 PM  Resp    SpO2 100 % 10/13/2018  1:17 PM  Vitals shown include unvalidated device data.  Last Pain:  Vitals:   10/13/18 0631  TempSrc: Oral  PainSc: 0-No pain         Complications: No apparent anesthesia complications

## 2018-10-13 NOTE — Progress Notes (Signed)
Patient transferred to [facility] unit. While getting the patient up for the first time to the restroom, patient stated that she felt nauseous and faint. Patient started to loose balance and slid to floor with assistance of two RN's. When patient was able to gain enough strength, she stood up and sit in bed. IV LR fluids opened up. Blood pressure is 97/64, 100% saturation on room air, 92 hr. No shortness of breath noted. Dr. Marla Roe and Dr. Barry Dienes aware of situation. No new orders. Will continue to closely monitor patient. Will update MD if change in status. Setzer, Marchelle Folks

## 2018-10-13 NOTE — Interval H&P Note (Signed)
History and Physical Interval Note:  10/13/2018 7:29 AM  Samantha Mcneil  has presented today for surgery, with the diagnosis of left breast cancer, brca2 mutation.  The various methods of treatment have been discussed with the patient and family. After consideration of risks, benefits and other options for treatment, the patient has consented to  Procedure(s): BILATERAL SKIN SPARING MASTECTOMIES WITH LEFT SEED TARGETED DEEP LYMPH NODE BIOPSY AND LEFT AXILLARY SENTINEL LYMPH NODE MAPPING AND BIOPSY (Bilateral) BILATERAL IMMEDIATE BREAST RECONSTRUCTION WITH PLACEMENT OF TISSUE EXPANDER AND FLEX HD (ACELLULAR HYDRATED DERMIS) (Bilateral) as a surgical intervention.  The patient's history has been reviewed, patient examined, no change in status, stable for surgery.  I have reviewed the patient's chart and labs.  Questions were answered to the patient's satisfaction.     [name]

## 2018-10-13 NOTE — Op Note (Signed)
Bilateral Nipple Sparing Mastectomies with Left Sentinel Node Mapping and Biopsy   Indications: This patient presents with history of left breast cancer, upper outer quadrant, cT2N0, grade 3, triple negative.  Also BRCA2 mutation.  She is s/p neoadjuvant chemotherapy  Pre-operative Diagnosis: left breast cancer  Post-operative Diagnosis: same  Surgeon: [name]   Anesthesia: General endotracheal anesthesia and bilateral pectoral block with exparel  ASA Class: 2  Procedure Details  The patient was seen in the Holding Room. The risks, benefits, complications, treatment options, and expected outcomes were discussed with the patient. The possibilities of reaction to medication, pulmonary aspiration, bleeding, infection, the need for additional procedures, failure to diagnose a condition, and creating a complication requiring transfusion or operation were discussed with the patient. The patient concurred with the proposed plan, giving informed consent.  The site of surgery properly noted/marked. The patient was taken to Operating Room # 2, identified as Tamsen Meek and the procedure verified as Bilateral skin sparing Mastectomies and left Sentinel Node mapping and Biopsy. A Time Out was held and the above information confirmed.  The methylene blue was injected in the left subareolar location.    After induction of anesthesia, the bilateral breast and chest were prepped and draped in standard fashion.  The borders of the breast were identified and marked. I started with the right breast.  A 7 cm incision was marked in the inframammary fold.  The incision was made with the #10 blade.  The cautery was used to take the dissection down to the serratus fascia.   Mastectomy hooks were used to provide elevation of the skin edges initially.  The cautery was used to create the mastectomy pocket.  The dissection was taken to the clavicle superiorly, the latissimus laterally, the lateral sternal border  medially.  The dissection was taken down to the fascia of the pectoralis major.  The penetrating vessels were clipped as needed.  The breast was taken off including the pectoralis fascia and the axillary tail marked.  The right mastectomy pocket was irrigated.  Hemostasis was achieved with cautery.  This side was packed with 2 laps.    Using a hand-held gamma probe, axillary sentinel nodes were identified.  A small 2.5 cm incision was made in the axilla.  Three deep level 2 axillary sentinel nodes were removed and submitted to pathology.  The findings are below.  The lymphovascular channels were clipped with metal clips.      The left breast was addressed similarly.  The wound was irrigated. Hemostasis was achieved with cautery.  The axillary wound was irrigated and closed with a 3-0 Vicryl deep dermal interrupted sutures and 4-0 Vicryl subcuticular closure in layers.    The patient was left with Dr. Marla Roe for reconstruction.     Findings: grossly clear surgical margins.  Peak SLN was 5500 cps.  Background was 10.    Estimated Blood Loss: 150 mL          Drains: per Dr. Marla Roe                Specimens: right breast and Three deep left axillary sentinel nodes, left breast.           Complications:  None; patient tolerated the procedure well.         Disposition: PACU - hemodynamically stable.         Condition: stable

## 2018-10-13 NOTE — Anesthesia Postprocedure Evaluation (Signed)
Anesthesia Post Note  Patient: Briane Birden  Procedure(s) Performed: BILATERAL SKIN SPARING MASTECTOMIES WITH LEFT SENTINEL  LYMPH NODE BIOPSY. (Bilateral Breast) BILATERAL IMMEDIATE BREAST RECONSTRUCTION WITH PLACEMENT OF TISSUE EXPANDER AND FLEX HD (ACELLULAR HYDRATED DERMIS) (Bilateral Breast)     Patient location during evaluation: PACU Anesthesia Type: General Level of consciousness: sedated Pain management: pain level controlled Vital Signs Assessment: post-procedure vital signs reviewed and stable Respiratory status: spontaneous breathing Cardiovascular status: stable Postop Assessment: no apparent nausea or vomiting Anesthetic complications: no    Last Vitals:  Vitals:   10/13/18 1415 10/13/18 1430  BP: 105/63 (!) 99/57  Pulse: 85 89  Resp: 18 18  Temp:    SpO2: 98% 97%    Last Pain:  Vitals:   10/13/18 1415  TempSrc:   PainSc: Asleep   Pain Goal:                   Huston Foley

## 2018-10-13 NOTE — Op Note (Signed)
Op report    DATE OF OPERATION:  10/13/2018  LOCATION: [place]  SURGICAL DIVISION: Plastic Surgery  PREOPERATIVE DIAGNOSES:  1. Breast cancer.    POSTOPERATIVE DIAGNOSES:  1. Breast cancer.   PROCEDURE:  1. Bilateral immediate breast reconstruction with placement of Acellular Dermal Matrix and tissue expanders.  SURGEON: Claire Sanger Dillingham, DO  ASSISTANT: [place], RNFA  ANESTHESIA:  General.   COMPLICATIONS: None.   IMPLANTS: Left - Mentor 535 cc. Ref #[id number].  200 cc of injectable saline placed in the expander. Right - Mentor 535 cc. Ref #[id number]. 200 cc of injectable saline placed in the expander. Acellular Dermal Matrix 6 x 14 cm two  INDICATIONS FOR PROCEDURE:  The patient, Samantha Mcneil, is a 33 y.o. female born on 30-May-1986, is here for  immediate first stage breast reconstruction with placement of bilateral tissue expander and Acellular dermal matrix. MRN: [id number]  CONSENT:  Informed consent was obtained directly from the patient. Risks, benefits and alternatives were fully discussed. Specific risks including but not limited to bleeding, infection, hematoma, seroma, scarring, pain, implant infection, implant extrusion, capsular contracture, asymmetry, wound healing problems, and need for further surgery were all discussed. The patient did have an ample opportunity to have her questions answered to her satisfaction.   DESCRIPTION OF PROCEDURE:  The patient was taken to the operating room by the general surgery team. SCDs were placed and IV antibiotics were given. The patient's chest was prepped and draped in a sterile fashion. A time out was performed and the implants to be used were identified.  Bilateral mastectomies were performed.  Once the general surgery team had completed their portion of the case the patient was rendered to the plastic and reconstructive surgery team.  Left:  The pectoralis major muscle was lifted  from the chest wall with release of the lateral edge and lateral inframammary fold.  The pocket was irrigated with antibiotic solution and hemostasis was achieved with electrocautery.  The ADM was then prepared according to the manufacture guidelines and slits placed to help with postoperative fluid management.  The ADM was then sutured to the inferior and lateral edge of the inframammary fold with 2-0 PDS starting with an interrupted stitch and then a running stitch.  The lateral portion was sutured to with interrupted sutures after the expander was placed.  The expander was prepared according to the manufacture guidelines, the air evacuated and then it was placed under the ADM and pectoralis major muscle.  The inferior and lateral tabs were used to secure the expander to the chest wall with 2-0 PDS.  The drain was placed at the inframammary fold over the ADM and secured to the skin with 3-0 Silk.  The deep layers were closed with 3-0 Monocryl followed by 4-0 Monocryl.  The skin was closed with 5-0 Monocryl and then dermabond was applied.    Right: The pectoralis major muscle was lifted from the chest wall with release of the lateral edge and lateral inframammary fold.  The pocket was irrigated with antibiotic solution and hemostasis was achieved with electrocautery.  The ADM was then prepared according to the manufacture guidelines and slits placed to help with postoperative fluid management.  The ADM was then sutured to the inferior and lateral edge of the inframammary fold with 2-0 PDS starting with an interrupted stitch and then a running stitch.  The lateral portion was sutured to with interrupted sutures after the expander was placed.  The expander was prepared  according to the manufacture guidelines, the air evacuated and then it was placed under the ADM and pectoralis major muscle.  The inferior and lateral tabs were used to secure the expander to the chest wall with 2-0 PDS.  The drain was placed at the  inframammary fold over the ADM and secured to the skin with 3-0 Silk.    The deep layers were closed with 3-0 Monocryl followed by 4-0 Monocryl.  The skin was closed with 5-0 Monocryl and then dermabond was applied.  The ABDs and breast binder were placed.  The patient tolerated the procedure well and there were no complications.  The patient was allowed to wake from anesthesia and taken to the recovery room in satisfactory condition.  The ABDs and breast binder were placed.  The patient tolerated the procedure well and there were no complications.  The patient was allowed to wake from anesthesia and taken to the recovery room in satisfactory condition.

## 2018-10-13 NOTE — Anesthesia Procedure Notes (Signed)
Anesthesia Regional Block: Pectoralis block   Pre-Anesthetic Checklist: ,, timeout performed, Correct Patient, Correct Site, Correct Laterality, Correct Procedure, Correct Position, site marked, Risks and benefits discussed,  Surgical consent,  Pre-op evaluation,  At surgeon's request and post-op pain management  Laterality: Right  Prep: chloraprep       Needles:  Injection technique: Single-shot  Needle Type: Echogenic Stimulator Needle     Needle Length: 10cm  Needle Gauge: 21   Needle insertion depth: 2 cm   Additional Needles:   Procedures:,,,, ultrasound used (permanent image in chart),,,,  Narrative:  Start time: 10/13/2018 7:00 AM End time: 10/13/2018 7:08 AM Injection made incrementally with aspirations every 5 mL.  Performed by: Personally  Anesthesiologist: Lyn Hollingshead, MD

## 2018-10-13 NOTE — Discharge Instructions (Signed)
INSTRUCTIONS FOR AFTER BREAST SURGERY ° ° °You are getting ready to undergo breast surgery.  You will likely have some questions about what to expect following your operation.  The following information will help you and your family understand what to expect when you are discharged from the hospital.  Following these guidelines will help ensure a smooth recovery and reduce risks of complications.   °Postoperative instructions include information on: diet, wound care, medications and physical activity. ° °AFTER SURGERY °Expect to go home after the procedure.  In some cases, you may need to spend one night in the hospital for observation. ° °DIET °Breast surgery does not require a specific diet.  However, I have to mention that the healthier you eat the better your body can start healing. It is important to increasing your protein intake.  This means limiting the foods with sugar and carbohydrates.  Focus on vegetables and some meat.  If you have any liposuction during your procedure be sure to drink water.  If your urine is bright yellow, then it is concentrated, and you need to drink more water.  As a general rule after surgery, you should have 8 ounces of water every hour while awake.  If you find you are persistently nauseated or unable to take in liquids let us know.  NO TOBACCO USE or EXPOSURE.  This will slow your healing process and increase the risk of a wound. ° °WOUND CARE °You can shower the day after surgery if you don't have a drain.  Use fragrance free soap.  Dial, Dove and Ivory are usually mild on the skin. If you have a drain clean with baby wipes until the drain is removed.  If you have steri-strips / tape directly attached to your skin leave them in place. It is OK to get these wet.  No baths, pools or hot tubs for two weeks. °We close your incision to leave the smallest and best-looking scar. No ointment or creams on your incisions until given the go ahead.  Especially not Neosporin (Too many skin  reactions with this one).  A few weeks after surgery you can use Mederma and start massaging the scar. °We ask you to wear your binder or sports bra for the first 6 weeks around the clock, including while sleeping. This provides added comfort and helps reduce the fluid accumulation at the surgery site. ° °ACTIVITY °No heavy lifting until cleared by the doctor.  This usually means no more than a half-gallon of milk.  It is OK to walk and climb stairs. In fact, moving your legs is very important to decrease your risk of a blood clot.  It will also help keep you from getting deconditioned.  Every 1 to 2 hours get up and walk for 5 minutes. This will help with a quicker recovery back to normal.  Let pain be your guide so you don't do too much.  NO, you cannot do the spring cleaning and don't plan on taking care of anyone else.  This is your time for TLC.  °You will be more comfortable if you sleep and rest with your head elevated either with a few pillows under you or in a recliner.  No stomach sleeping for a few months. ° °WORK °Everyone returns to work at different times. As a rough guide, most people take at least 1 - 2 weeks off prior to returning to work. If you need documentation for your job, bring the forms to your postoperative follow   up visit. ° °DRIVING °Arrange for someone to bring you home from the hospital.  You may be able to drive a few days after surgery but not while taking any narcotics or valium. ° °BOWEL MOVEMENTS °Constipation can occur after anesthesia and while taking pain medication.  It is important to stay ahead for your comfort.  We recommend taking Milk of Magnesia (2 tablespoons; twice a day) while taking the pain pills. ° °SEROMA °This is fluid your body tried to put in the surgical site.  This is normal but if it creates tight skinny skin let us know.  It usually decreases in a few weeks. ° °WHEN TO CALL °Call your surgeon's office if any of the following occur: °• Fever 101 degrees F or  greater °• Excessive bleeding or fluid from the incision site. °• Pain that increases over time without aid from the medications °• Redness, warmth, or pus draining from incision sites °• Persistent nausea or inability to take in liquids °• Severe misshapen area that underwent the operation. ° °Here are some resources: ° °1. Plastic surgery website: https://www.plasticsurgery.org/for-medical-professionals/education-and-resources/publications/breast-reconstruction-magazine °2. Breast Reconstruction Awareness Campaign:  http://www.breastreconusa.org/ °3. Plastic surgery Implant information:  https://www.plasticsurgery.org/patient-safety/breast-implant-safety ° °

## 2018-10-14 ENCOUNTER — Encounter (HOSPITAL_BASED_OUTPATIENT_CLINIC_OR_DEPARTMENT_OTHER): Payer: Self-pay | Admitting: General Surgery

## 2018-10-14 DIAGNOSIS — C50412 Malignant neoplasm of upper-outer quadrant of left female breast: Secondary | ICD-10-CM | POA: Diagnosis not present

## 2018-10-14 MED ORDER — ONDANSETRON HCL 4 MG PO TABS: 4.0000 mg | ORAL_TABLET | Freq: Three times a day (TID) | ORAL | 0 refills | Status: AC | PRN

## 2018-10-14 MED ORDER — CEPHALEXIN 500 MG PO CAPS: 500.0000 mg | ORAL_CAPSULE | Freq: Four times a day (QID) | ORAL | 0 refills | Status: AC

## 2018-10-14 MED ORDER — HYDROCODONE-ACETAMINOPHEN 5-325 MG PO TABS: 1.0000 | ORAL_TABLET | Freq: Four times a day (QID) | ORAL | 0 refills | Status: AC | PRN

## 2018-10-14 MED ORDER — DIAZEPAM 2 MG PO TABS: 2.0000 mg | ORAL_TABLET | Freq: Four times a day (QID) | ORAL | 0 refills | Status: DC | PRN

## 2018-10-14 MED FILL — CEPHALEXIN 500 MG CAPSULE: 500 | 5 days supply | Qty: 20 | Fill #0

## 2018-10-14 MED FILL — diazePAM 2 MG TABS: 2 | 8 days supply | Qty: 30 | Fill #0

## 2018-10-14 MED FILL — HYDROCODON-APAP 5-325: 5-325 | 5 days supply | Qty: 20 | Fill #0

## 2018-10-14 MED FILL — ONDANSETRON HCL 4 MG TABLET: 4 | 5 days supply | Qty: 15 | Fill #0

## 2018-10-14 NOTE — Addendum Note (Signed)
Addendum  created 10/14/18 0905 by Tawni Millers, CRNA   Charge Capture section accepted

## 2018-10-14 NOTE — Discharge Summary (Signed)
Physician Discharge Summary  Patient ID: Samantha Mcneil MRN: [id number] DOB/AGE: [birth year] 33 y.o.  Admit date: 10/13/2018 Discharge date: 10/14/2018  Admission Diagnoses:  Discharge Diagnoses:  Active Problems:   Breast cancer [facility])   Discharged Condition: good  Hospital Course: The patient was taken to the OR and underwent bilateral mastectomies.  She had immediate reconstruction and did well.  She was observed in the hospital overnight.  She is walking, eating and voiding without difficulty.  She is ready for home.  Follow up in the clinic in one week.  Consults: None  Significant Diagnostic Studies: none  Treatments: surger  Discharge Exam: Blood pressure (!) 90/55, pulse 85, temperature 98.1 F (36.7 C), resp. rate 16, height 5\' 4"  (1.626 m), weight 71.2 kg, SpO2 95 %. General appearance: alert, cooperative and no distress Incision/Wound: closed  Disposition: Discharge disposition: 01-Home or Self Care       Discharge Instructions    Call MD for:  persistant nausea and vomiting   Complete by:  As directed    Call MD for:  severe uncontrolled pain   Complete by:  As directed    Call MD for:  temperature >100.4   Complete by:  As directed    Diet general   Complete by:  As directed    Discharge wound care:   Complete by:  As directed    Drain care   Driving Restrictions   Complete by:  As directed    No driving while on pain meds   Increase activity slowly   Complete by:  As directed    Lifting restrictions   Complete by:  As directed    No heavy lifting     Allergies as of 10/14/2018   No Known Allergies     Medication List    TAKE these medications   ibuprofen 200 MG tablet Commonly known as:  ADVIL,MOTRIN Take 200 mg by mouth every 6 (six) hours as needed.   lidocaine-prilocaine cream Commonly known as:  EMLA Apply to affected area once   venlafaxine XR 37.5 MG 24 hr capsule Commonly known as:  EFFEXOR-XR Take 1 capsule (37.5 mg total)  by mouth daily with breakfast.            Discharge Care Instructions  (From admission, onward)         Start     Ordered   10/14/18 0000  Discharge wound care:    Comments:  Drain care   10/14/18 4098         Follow-up Information    Dillingham, Loel Lofty, DO In 1 week.   Specialty:  Plastic Surgery Contact information: [place] [address] [phone]        [name], MD In 2 weeks.   Specialty:  General Surgery Contact information: [address] [place] [address] [phone]           Signed: Wallace Going 10/14/2018, 8:12 AM

## 2018-10-14 NOTE — Addendum Note (Signed)
Addended by: Wallace Going on: 10/14/2018 09:04 AM   Modules accepted: Orders

## 2018-10-16 ENCOUNTER — Telehealth: Payer: Self-pay | Admitting: *Deleted

## 2018-10-16 NOTE — Progress Notes (Signed)
Please let patient know that there was no cancer left after the chemo and her lymph nodes are negative.

## 2018-10-16 NOTE — Telephone Encounter (Signed)
Left vm to assess needs and questions. Contact information provided.

## 2018-10-19 ENCOUNTER — Telehealth: Payer: Self-pay | Admitting: Plastic Surgery

## 2018-10-19 NOTE — Assessment & Plan Note (Addendum)
/  05/2018:Palpable masses in both breasts with family history of breast cancer, breast density category D, ultrasound measured these hypoechoic irregular mass left breast 2.3 cm, right breast no abnormality, biopsy left breast mass: IDC grade 3 ER 0%, PR 0%, HER-2 negative, Ki-67 90%, lymph node biopsy benign: T2N0, stage IIb  Recommendationbased on multidisciplinary tumor board: Genetics consultation 1. Neoadjuvant chemotherapy with Adriamycin and Cytoxan dose dense 4 followed byTaxol with carboplatinweekly 12  completed 08/20/2018 2. Followed by bilateral mastectomies with reconstruction with targeted axillary dissection 10/13/2018: Complete pathologic response, 0/3 lymph nodes negative 3. Followed by adjuvant radiation therapy  BRCA2 mutation: Bilateral mastectomies 10/13/2018 Oophorectomy after the age of 29 ---------------------------------------------------------------------------------------------------------------- Plan: Adjuvant radiation therapy Followed by surveillance.  Pathology counseling: I discussed the final pathology report of the patient provided  a copy of this report. I discussed the margins as well as lymph node surgeries. We also discussed the final staging along with previously performed ER/PR and HER-2/neu testing.  Return to clinic in 6 months for follow-up

## 2018-10-19 NOTE — Telephone Encounter (Signed)
Called patient to confirm appointment scheduled for tomorrow. Patient answered the following questions: °1.Has the patient traveled outside of the state of [place] at all within the past 6 weeks? No °2.Does the patient have a fever or cough at all? No °3.Has the patient been tested for COVID? Had a positive COVID test? No °4. Has the patient been in contact with anyone who has tested positive? No ° °

## 2018-10-20 ENCOUNTER — Other Ambulatory Visit: Payer: Self-pay

## 2018-10-20 ENCOUNTER — Ambulatory Visit (INDEPENDENT_AMBULATORY_CARE_PROVIDER_SITE_OTHER): Payer: BLUE CROSS/BLUE SHIELD | Admitting: Plastic Surgery

## 2018-10-20 ENCOUNTER — Encounter: Payer: Self-pay | Admitting: Plastic Surgery

## 2018-10-20 VITALS — BP 110/75 | HR 83 | Temp 97.1°F | Ht 64.0 in | Wt 153.0 lb

## 2018-10-20 DIAGNOSIS — Z171 Estrogen receptor negative status [ER-]: Secondary | ICD-10-CM

## 2018-10-20 DIAGNOSIS — C50412 Malignant neoplasm of upper-outer quadrant of left female breast: Secondary | ICD-10-CM

## 2018-10-20 DIAGNOSIS — Z9010 Acquired absence of unspecified breast and nipple: Secondary | ICD-10-CM | POA: Insufficient documentation

## 2018-10-20 DIAGNOSIS — Z1509 Genetic susceptibility to other malignant neoplasm: Secondary | ICD-10-CM

## 2018-10-20 DIAGNOSIS — Z9013 Acquired absence of bilateral breasts and nipples: Secondary | ICD-10-CM

## 2018-10-20 DIAGNOSIS — Z1501 Genetic susceptibility to malignant neoplasm of breast: Secondary | ICD-10-CM

## 2018-10-20 NOTE — Progress Notes (Signed)
   Subjective:    Patient ID: Samantha Mcneil, female    DOB: 15-Feb-1986, 33 y.o.   MRN: [id number]  The patient is a 33 year old female here for postop follow-up after bilateral breast reconstruction.  She complains of left upper arm pain.  She had lymph nodes excised on this side so likely the reason for more pain.  Her incisions are healing well.  There is no sign of seroma or hematoma.  Her nipple areole is are with good color and her flaps with good viability.  No sign of infection.  She was treated for a left breast invasive carcinoma upper outer quadrant (triple negative).  She is BRCA-2 positive.  The drain output has been minimal on both sides.   Review of Systems  Constitutional: Positive for activity change. Negative for appetite change and chills.  HENT: Negative.   Eyes: Negative.   Respiratory: Negative.   Cardiovascular: Negative.   Gastrointestinal: Negative.   Genitourinary: Negative.   Musculoskeletal: Positive for back pain.  Hematological: Negative.   Psychiatric/Behavioral: Negative.        Objective:   Physical Exam Vitals signs and nursing note reviewed.  HENT:     Head: Normocephalic.  Cardiovascular:     Rate and Rhythm: Normal rate.  Pulmonary:     Effort: Pulmonary effort is normal.  Skin:    General: Skin is warm.  Neurological:     Mental Status: She is alert and oriented to person, place, and time.  Psychiatric:        Mood and Affect: Mood normal.        Assessment & Plan:  BRCA2 gene mutation positive  Malignant neoplasm of upper-outer quadrant of left breast in female, estrogen receptor negative ([place])  Acquired absence of both breasts We placed injectable saline in the Expander using a sterile technique: Right: 50 cc for a total of 250 / 535 cc Left: 50 cc for a total of 250 / 535 cc  Follow-up in 1 week and most likely will be able to remove the drains.

## 2018-10-21 ENCOUNTER — Telehealth: Payer: Self-pay | Admitting: Hematology and Oncology

## 2018-10-21 NOTE — Telephone Encounter (Signed)
Called regarding upcoming Webex appointment, patient is notified and test run has been completed. E-mail sent.

## 2018-10-21 NOTE — Progress Notes (Signed)
HEMATOLOGY-ONCOLOGY [place] VISIT PROGRESS NOTE  I connected with Samantha Mcneil on 10/22/2018 at  2:00 PM EDT by Webex video conference and verified that I am speaking with the correct person using two identifiers.  I discussed the limitations, risks, security and privacy concerns of performing an evaluation and management service by Webex and the availability of in person appointments.  I also discussed with the patient that there may be a patient responsible charge related to this service. The patient expressed understanding and agreed to proceed.  Patient's Location: Home Physician Location: Clinic  CHIEF COMPLIANT: Follow-up s/p bilateral mastectomies to review pathology and discuss further treatmentt  INTERVAL HISTORY: Samantha Mcneil is a 33 y.o. female with above-mentioned history of triple negative left breast cancer who completed neoadjuvant chemotherapy and underwent bilateral mastectomies on 10/13/18, for which pathology confirmed complete response to treatment and no evidence of malignancy in lymph nodes. She presents today over Webex to discuss further treatment.  She is in a lot of pain from the recent surgeries.  She is very tearful and emotional today.  She tells me that she has been an emotional roller coaster.  She also complains of hot flashes frequently.    Malignant neoplasm of upper-outer quadrant of left breast in female, estrogen receptor negative ([place])   03/12/2018 Initial Diagnosis    Palpable masses in both breasts with family history of breast cancer, breast density category D, ultrasound measured these hypoechoic irregular mass left breast 2.3 cm, right breast no abnormality, biopsy left breast mass: IDC grade 3 ER 0%, PR 0%, HER-2 negative, Ki-67 90%, lymph node biopsy benign: T2N0    03/18/2018 Cancer Staging    Staging form: Breast, AJCC 8th Edition - Clinical: Stage IIB (cT2, cN0, cM0, G3, ER-, PR-, HER2-) - Signed by Nicholas Lose, MD on 03/18/2018    03/30/2018  Breast MRI    Single biopsy-proven malignancy in the left 12 o'clock breast measures 2.3 x 1.3 x 1.5 cm.  Right breast without any malignancy detected.    04/02/2018 - 08/20/2018 Neo-Adjuvant Chemotherapy    Neoadjuvant chemotherapy with dose dense Adriamycin and Cytoxan followed by Taxol and carboplatin    04/23/2018 Genetic Testing    Positive genetic testing: A pathogenic variant was identified in BRCA2 called [id number] (p.Ala2951Glyfs*26) on the Common Hereditary Cancers Panel. The Common Hereditary Cancers Panel offered by [facility] includes sequencing and/or deletion duplication testing of the following 47 genes: APC, ATM, AXIN2, BARD1, BMPR1A, BRCA1, BRCA2, BRIP1, CDH1, CDKN2A (p14ARF), CDKN2A (p16INK4a), CKD4, CHEK2, CTNNA1, DICER1, EPCAM (Deletion/duplication testing only), GREM1 (promoter region deletion/duplication testing only), KIT, MEN1, MLH1, MSH2, MSH3, MSH6, MUTYH, NBN, NF1, NHTL1, PALB2, PDGFRA, PMS2, POLD1, POLE, PTEN, RAD50, RAD51C, RAD51D, SDHB, SDHC, SDHD, SMAD4, SMARCA4. STK11, TP53, TSC1, TSC2, and VHL.  The following genes were evaluated for sequence changes only: SDHA and HOXB13 c.251G>A variant only. Genetic testing did detect a Variant of Unknown Significance (VUS) in the PALB2 gene called c.205C>T.  At this time, it is unknown if this variant is associated with increased cancer risk or if this is a normal finding, but most variants such as this get reclassified to being inconsequential. It should not be used to make medical management decisions.   The report date is 04/23/2018.     10/13/2018 Surgery    Bilateral mastectomies: right, benign; left, complete therapeutic response, no residual carcinoma, 3 SLN negative    10/15/2018 Cancer Staging    Staging form: Breast, AJCC 8th Edition - Pathologic: No Stage Recommended (ypT0, pN0,  cM0) - Signed by [name], NP on 10/15/2018     REVIEW OF SYSTEMS:   Constitutional: Denies fevers, chills or abnormal  weight loss Eyes: Denies blurriness of vision Ears, nose, mouth, throat, and face: Denies mucositis or sore throat Respiratory: Denies cough, dyspnea or wheezes Cardiovascular: Denies palpitation, chest discomfort Gastrointestinal:  Denies nausea, heartburn or change in bowel habits Skin: Denies abnormal skin rashes Lymphatics: Denies new lymphadenopathy or easy bruising Neurological:Denies numbness, tingling or new weaknesses Behavioral/Psych: Mood is stable, no new changes  Extremities: No lower extremity edema Breast: denies any pain or lumps or nodules in either breasts All other systems were reviewed with the patient and are negative.  Observations/Objective:  There were no vitals filed for this visit. There is no height or weight on file to calculate BMI.  I have reviewed the data as listed CMP Latest Ref Rng & Units 08/20/2018 08/13/2018 08/06/2018  Glucose 70 - 99 mg/dL 98 117(H) 91  BUN 6 - 20 mg/dL '8 12 12  ' Creatinine 0.44 - 1.00 mg/dL 0.66 0.66 0.63  Sodium 135 - 145 mmol/L 142 142 142  Potassium 3.5 - 5.1 mmol/L 3.7 4.1 4.1  Chloride 98 - 111 mmol/L 106 106 108  CO2 22 - 32 mmol/L '27 28 26  ' Calcium 8.9 - 10.3 mg/dL 9.4 9.7 9.5  Total Protein 6.5 - 8.1 g/dL 6.7 6.9 6.7  Total Bilirubin 0.3 - 1.2 mg/dL 0.3 0.3 0.4  Alkaline Phos 38 - 126 U/L 114 106 90  AST 15 - 41 U/L 18 41 21  ALT 0 - 44 U/L 33 62(H) 32    Lab Results  Component Value Date   WBC 7.1 08/20/2018   HGB 9.7 (L) 08/20/2018   HCT 28.1 (L) 08/20/2018   MCV 91.8 08/20/2018   PLT 141 (L) 08/20/2018   NEUTROABS 5.1 08/20/2018      Assessment Plan:  Malignant neoplasm of upper-outer quadrant of left breast in female, estrogen receptor negative ([name]) /05/2018:Palpable masses in both breasts with family history of breast cancer, breast density category D, ultrasound measured these hypoechoic irregular mass left breast 2.3 cm, right breast no abnormality, biopsy left breast mass: IDC grade 3 ER 0%, PR 0%,  HER-2 negative, Ki-67 90%, lymph node biopsy benign: T2N0, stage IIb  Recommendationbased on multidisciplinary tumor board: Genetics consultation 1. Neoadjuvant chemotherapy with Adriamycin and Cytoxan dose dense 4 followed byTaxol with carboplatinweekly 12  completed 08/20/2018 2. Followed by bilateral mastectomies with reconstruction with targeted axillary dissection 10/13/2018: Complete pathologic response, 0/3 lymph nodes negative  BRCA2 mutation: Bilateral mastectomies 10/13/2018 Oophorectomy after the age of 65 ---------------------------------------------------------------------------------------------------------------- Plan: surveillance. Patient is undergoing breast reconstruction and is in a lot of pain.  Pathology counseling: I discussed the final pathology report of the patient provided  a copy of this report. I discussed the margins as well as lymph node surgeries. We also discussed the final staging along with previously performed ER/PR and HER-2/neu testing.  Depression: I increase the dosage of Effexor to 75 mg daily.  Return to clinic in 6 months for follow-up  I discussed the assessment and treatment plan with the patient. The patient was provided an opportunity to ask questions and all were answered. The patient agreed with the plan and demonstrated an understanding of the instructions. The patient was advised to call back or seek an in-person evaluation if the symptoms worsen or if the condition fails to improve as anticipated.   I provided 15 minutes of face-to-face Web  Ex time during this encounter.    Rulon Eisenmenger, MD 10/22/2018   I, Molly Dorshimer, am acting as scribe for Nicholas Lose, MD.  I have reviewed the above documentation for accuracy and completeness, and I agree with the above.

## 2018-10-22 ENCOUNTER — Inpatient Hospital Stay: Payer: BLUE CROSS/BLUE SHIELD | Attending: Hematology and Oncology | Admitting: Hematology and Oncology

## 2018-10-22 DIAGNOSIS — Z171 Estrogen receptor negative status [ER-]: Secondary | ICD-10-CM

## 2018-10-22 DIAGNOSIS — Z9221 Personal history of antineoplastic chemotherapy: Secondary | ICD-10-CM | POA: Diagnosis not present

## 2018-10-22 DIAGNOSIS — Z9013 Acquired absence of bilateral breasts and nipples: Secondary | ICD-10-CM | POA: Diagnosis not present

## 2018-10-22 DIAGNOSIS — C50412 Malignant neoplasm of upper-outer quadrant of left female breast: Secondary | ICD-10-CM | POA: Diagnosis not present

## 2018-10-22 DIAGNOSIS — Z79899 Other long term (current) drug therapy: Secondary | ICD-10-CM

## 2018-10-22 MED ORDER — VENLAFAXINE HCL ER 75 MG PO CP24: 75.0000 mg | ORAL_CAPSULE | Freq: Every day | ORAL | 3 refills | Status: DC

## 2018-10-22 MED FILL — VENLAFAXINE HCL ER 75 MG CA: 75 | 90 days supply | Qty: 90 | Fill #0

## 2018-10-23 ENCOUNTER — Encounter: Payer: Self-pay | Admitting: *Deleted

## 2018-10-26 ENCOUNTER — Telehealth: Payer: Self-pay | Admitting: Hematology and Oncology

## 2018-10-26 ENCOUNTER — Telehealth: Payer: Self-pay | Admitting: Adult Health

## 2018-10-26 NOTE — Telephone Encounter (Signed)
Tried to reach regarding schedule °

## 2018-10-26 NOTE — Telephone Encounter (Signed)
Scheduled SCP per sch msg.

## 2018-10-29 ENCOUNTER — Ambulatory Visit (INDEPENDENT_AMBULATORY_CARE_PROVIDER_SITE_OTHER): Payer: BLUE CROSS/BLUE SHIELD | Admitting: Plastic Surgery

## 2018-10-29 ENCOUNTER — Encounter: Payer: Self-pay | Admitting: Plastic Surgery

## 2018-10-29 ENCOUNTER — Other Ambulatory Visit: Payer: Self-pay

## 2018-10-29 VITALS — BP 109/68 | HR 100 | Temp 97.9°F | Ht 64.0 in | Wt 162.4 lb

## 2018-10-29 DIAGNOSIS — Z9013 Acquired absence of bilateral breasts and nipples: Secondary | ICD-10-CM

## 2018-10-29 NOTE — Progress Notes (Signed)
   Subjective:    Patient ID: Samantha Mcneil, female    DOB: August 28, 1985, 33 y.o.   MRN: [id number]  The patient is a 33 year old female here for another follow-up after bilateral mastectomies with immediate reconstruction.  The incisions are healing well.  There is no sign of seroma or hematoma and her nipple area Ola color looks good and viable.  No sign of infection.  She had left breast invasive carcinoma that was triple negative and BRCA2 positive.  She tolerated her fill last time extremely well.  The drain output has been minimal and she is ready to have them removed.   Review of Systems  Constitutional: Positive for activity change. Negative for appetite change.  HENT: Negative.   Eyes: Negative.   Respiratory: Negative.  Negative for chest tightness and shortness of breath.   Cardiovascular: Negative.   Gastrointestinal: Negative.   Genitourinary: Negative.   Musculoskeletal: Negative.   Psychiatric/Behavioral: Negative.        Objective:   Physical Exam Vitals signs and nursing note reviewed.  Constitutional:      Appearance: Normal appearance.  HENT:     Head: Normocephalic and atraumatic.  Cardiovascular:     Rate and Rhythm: Normal rate.  Pulmonary:     Effort: Pulmonary effort is normal.  Neurological:     General: No focal deficit present.     Mental Status: She is alert. Mental status is at baseline.  Psychiatric:        Mood and Affect: Mood normal.        Behavior: Behavior normal.         Assessment & Plan:  Acquired absence of both breasts  S/P mastectomy, bilateral The drains were removed.  I would like to see her back in 2 weeks. We placed injectable saline in the Expander using a sterile technique: Right: 50 cc for a total of 300 / 535 cc Left: 50 cc for a total of 300 / 535 cc

## 2018-11-12 ENCOUNTER — Ambulatory Visit (INDEPENDENT_AMBULATORY_CARE_PROVIDER_SITE_OTHER): Payer: BLUE CROSS/BLUE SHIELD | Admitting: Plastic Surgery

## 2018-11-12 ENCOUNTER — Encounter: Payer: Self-pay | Admitting: Plastic Surgery

## 2018-11-12 ENCOUNTER — Other Ambulatory Visit: Payer: Self-pay

## 2018-11-12 VITALS — BP 113/77 | HR 83 | Temp 98.9°F | Ht 64.0 in | Wt 157.0 lb

## 2018-11-12 DIAGNOSIS — Z9013 Acquired absence of bilateral breasts and nipples: Secondary | ICD-10-CM

## 2018-11-12 NOTE — Progress Notes (Signed)
   Subjective:    Patient ID: Samantha Mcneil, female    DOB: 1985-08-11, 33 y.o.   MRN: [id number]  The patient is a 33 yrs old female here for follow up on her breast reconstruction.  She is doing well but not sleeping through the night.  The incisions are healing well. No sign of infection or hematoma.   Review of Systems  Constitutional: Negative for activity change and appetite change.  HENT: Negative.   Eyes: Negative.   Respiratory: Negative.  Negative for shortness of breath.   Cardiovascular: Negative for leg swelling.  Gastrointestinal: Negative.   Genitourinary: Negative.   Musculoskeletal: Negative.   Psychiatric/Behavioral: Negative.        Objective:   Physical Exam Vitals signs and nursing note reviewed.  Constitutional:      Appearance: Normal appearance.  HENT:     Head: Normocephalic and atraumatic.     Nose: Nose normal.     Mouth/Throat:     Mouth: Mucous membranes are moist.  Cardiovascular:     Rate and Rhythm: Normal rate.  Pulmonary:     Effort: Pulmonary effort is normal.  Abdominal:     General: Abdomen is flat.  Neurological:     General: No focal deficit present.     Mental Status: She is alert.  Psychiatric:        Mood and Affect: Mood normal.        Behavior: Behavior normal.        Assessment & Plan:  S/P mastectomy, bilateral  Acquired absence of both breasts she will likely have one or two more fills. Follow up in 2-3 weeks. We placed injectable saline in the Expander using a sterile technique: Right: 50 cc for a total of 350 / 535 cc Left: 50 cc for a total of 350 / 535 cc

## 2018-11-18 ENCOUNTER — Other Ambulatory Visit: Payer: Self-pay | Admitting: Plastic Surgery

## 2018-11-27 ENCOUNTER — Telehealth: Payer: Self-pay | Admitting: Plastic Surgery

## 2018-11-27 NOTE — Telephone Encounter (Signed)
Called patient to confirm appointment scheduled for Monday. Patient answered the following questions: 1. To the best of your knowledge, have you been in close contact with any one with a confirmed diagnosis of COVID-19? No 2. Have you had any one or more of the following; fever, chills, cough, shortness of breath, or any flu-like symptoms? No 3. Have you been diagnosed with or have a previous diagnosis of COVID 19? No 4. I am going to go over a few other symptoms with you. Please let me know if you are experiencing any of the following: None of the below a. Ear, nose, or throat discomfort b. A sore throat c. Headache d. Muscle pain e. Diarrhea f. Loss of taste or smell

## 2018-11-30 ENCOUNTER — Ambulatory Visit (INDEPENDENT_AMBULATORY_CARE_PROVIDER_SITE_OTHER): Payer: BLUE CROSS/BLUE SHIELD | Admitting: Plastic Surgery

## 2018-11-30 ENCOUNTER — Encounter: Payer: Self-pay | Admitting: Plastic Surgery

## 2018-11-30 ENCOUNTER — Other Ambulatory Visit: Payer: Self-pay

## 2018-11-30 VITALS — BP 116/76 | HR 86 | Temp 98.6°F | Ht 64.0 in | Wt 156.0 lb

## 2018-11-30 DIAGNOSIS — Z9013 Acquired absence of bilateral breasts and nipples: Secondary | ICD-10-CM

## 2018-11-30 MED ORDER — DIAZEPAM 2 MG PO TABS: 2.0000 mg | ORAL_TABLET | Freq: Two times a day (BID) | ORAL | 0 refills | Status: AC | PRN

## 2018-11-30 MED FILL — diazePAM 2 MG TABS: 2 | 5 days supply | Qty: 10 | Fill #0

## 2018-11-30 NOTE — Progress Notes (Signed)
   Subjective:    Patient ID: Samantha Mcneil, female    DOB: 14-Sep-1985, 33 y.o.   MRN: [id number]  The patient is a 33 year old female here for follow-up on her bilateral breast reconstruction.  She has been doing very well with her fills.  She underwent bilateral mastectomies for a left sided breast cancer.  She was also BRCA positive.  Her preoperative breast size was a 34 B. She is tolerating the fills very well.  She is pleased with the size.    Review of Systems  Constitutional: Negative.  Negative for activity change.  HENT: Negative.   Eyes: Negative.   Respiratory: Negative.   Cardiovascular: Negative.   Gastrointestinal: Negative.   Genitourinary: Negative.   Musculoskeletal: Negative.   Hematological: Negative.   Psychiatric/Behavioral: Negative.        Objective:   Physical Exam Vitals signs and nursing note reviewed.  Constitutional:      Appearance: Normal appearance.  HENT:     Head: Normocephalic and atraumatic.  Cardiovascular:     Rate and Rhythm: Normal rate.  Pulmonary:     Effort: Pulmonary effort is normal.  Neurological:     General: No focal deficit present.     Mental Status: She is alert and oriented to person, place, and time.  Psychiatric:        Mood and Affect: Mood normal.        Behavior: Behavior normal.         Assessment & Plan:  S/P mastectomy, bilateral  Acquired absence of both breasts  We placed injectable saline in the Expander using a sterile technique: Right: 50 cc for a total of 400 / 535 cc Left: 50 cc for a total of 400 / 535 cc  She is ready for the exchange for removal of bilateral breast expanders and placement of implants.  Valium refill sent to pharmacy. Pictures placed in the chart with the patient permission.

## 2018-12-09 ENCOUNTER — Other Ambulatory Visit: Payer: Self-pay

## 2018-12-09 ENCOUNTER — Encounter: Payer: Self-pay | Admitting: Women's Health

## 2018-12-09 ENCOUNTER — Ambulatory Visit (INDEPENDENT_AMBULATORY_CARE_PROVIDER_SITE_OTHER): Payer: BC Managed Care – PPO | Admitting: Women's Health

## 2018-12-09 VITALS — BP 118/80

## 2018-12-09 DIAGNOSIS — N898 Other specified noninflammatory disorders of vagina: Secondary | ICD-10-CM

## 2018-12-09 DIAGNOSIS — R300 Dysuria: Secondary | ICD-10-CM

## 2018-12-09 LAB — WET PREP FOR TRICH, YEAST, CLUE

## 2018-12-09 MED ORDER — NYSTATIN-TRIAMCINOLONE 100000-0.1 UNIT/GM-% EX OINT: 1.0000 "application " | TOPICAL_OINTMENT | Freq: Two times a day (BID) | CUTANEOUS | 0 refills | Status: DC

## 2018-12-09 MED ORDER — FLUCONAZOLE 150 MG PO TABS: ORAL_TABLET | ORAL | 1 refills | Status: DC

## 2018-12-09 MED FILL — FLUCONAZOLE 150 MG TABS: 150 | 3 days supply | Qty: 2 | Fill #0

## 2018-12-09 MED FILL — NYSTATIN-TRIAMCINOLONE OINT: 100000-0.1 | 10 days supply | Qty: 30 | Fill #0

## 2018-12-09 NOTE — Progress Notes (Signed)
33 year old MHF G2 P2 presents with complaint of vaginal irritation with itching and no visible discharge for the past week.  States that she used over-the-counter Monistat with minimal relief.  Last cycle November 2019.  Was diagnosed with breast cancer October 2019 bilateral mastectomy, chemotherapy and has had reconstructive surgery.  BRCA 2 positive.  Reports numerous hot flushes.  Had regular monthly cycles prior to chemotherapy.  Has a 81 and 8 year old daughter and son both doing well, states husband is extremely helpful and supportive.  Exam: Appears well.  No CVAT.  Abdomen soft, nontender, external genitalia extremely erythematous at introitus, unable to place speculum due to discomfort, wet prep done with a Q-tip, positive for yeast. UA: Trace leukocytes, negative nitrites, 6-10 WBCs, 0-5 squamous epithelials, few bacteria  Yeast vaginitis Amenorrheic due to chemotherapy status post bilateral mastectomy  Plan: Mycolog small amount externally twice daily, Diflucan 150 p.o. today repeat in 3 days.  Prescription for both given.  Instructed to call if continued problems.  Encouraged vaginal lubricants with intercourse.  Contraception options reviewed, ParaGard IUD, has had in the past, questioning tubal ligation, reviewed if would like to proceed will schedule appoint with Dr. Phineas Real to discuss.  Urine culture pending.

## 2018-12-09 NOTE — Patient Instructions (Signed)
Vaginal Yeast infection, Adult  Vaginal yeast infection is a condition that causes vaginal discharge as well as soreness, swelling, and redness (inflammation) of the vagina. This is a common condition. Some women get this infection frequently. What are the causes? This condition is caused by a change in the normal balance of the yeast (candida) and bacteria that live in the vagina. This change causes an overgrowth of yeast, which causes the inflammation. What increases the risk? The condition is more likely to develop in women who:  Take antibiotic medicines.  Have diabetes.  Take birth control pills.  Are pregnant.  Douche often.  Have a weak body defense system (immune system).  Have been taking steroid medicines for a long time.  Frequently wear tight clothing. What are the signs or symptoms? Symptoms of this condition include:  White, thick, creamy vaginal discharge.  Swelling, itching, redness, and irritation of the vagina. The lips of the vagina (vulva) may be affected as well.  Pain or a burning feeling while urinating.  Pain during sex. How is this diagnosed? This condition is diagnosed based on:  Your medical history.  A physical exam.  A pelvic exam. Your health care provider will examine a sample of your vaginal discharge under a microscope. Your health care provider may send this sample for testing to confirm the diagnosis. How is this treated? This condition is treated with medicine. Medicines may be over-the-counter or prescription. You may be told to use one or more of the following:  Medicine that is taken by mouth (orally).  Medicine that is applied as a cream (topically).  Medicine that is inserted directly into the vagina (suppository). Follow these instructions at home:  Lifestyle  Do not have sex until your health care provider approves. Tell your sex partner that you have a yeast infection. That person should go to his or her health care  provider and ask if they should also be treated.  Do not wear tight clothes, such as pantyhose or tight pants.  Wear breathable cotton underwear. General instructions  Take or apply over-the-counter and prescription medicines only as told by your health care provider.  Eat more yogurt. This may help to keep your yeast infection from returning.  Do not use tampons until your health care provider approves.  Try taking a sitz bath to help with discomfort. This is a warm water bath that is taken while you are sitting down. The water should only come up to your hips and should cover your buttocks. Do this 3-4 times per day or as told by your health care provider.  Do not douche.  If you have diabetes, keep your blood sugar levels under control.  Keep all follow-up visits as told by your health care provider. This is important. Contact a health care provider if:  You have a fever.  Your symptoms go away and then return.  Your symptoms do not get better with treatment.  Your symptoms get worse.  You have new symptoms.  You develop blisters in or around your vagina.  You have blood coming from your vagina and it is not your menstrual period.  You develop pain in your abdomen. Summary  Vaginal yeast infection is a condition that causes discharge as well as soreness, swelling, and redness (inflammation) of the vagina.  This condition is treated with medicine. Medicines may be over-the-counter or prescription.  Take or apply over-the-counter and prescription medicines only as told by your health care provider.  Do not douche.   Do not have sex or use tampons until your health care provider approves.  Contact a health care provider if your symptoms do not get better with treatment or your symptoms go away and then return. This information is not intended to replace advice given to you by your health care provider. Make sure you discuss any questions you have with your health care  provider. Document Released: 03/27/2005 Document Revised: 11/03/2017 Document Reviewed: 11/03/2017 Elsevier Interactive Patient Education  2019 [employer] Laparoscopic Tubal Ligation Laparoscopic tubal ligation is a procedure to close the fallopian tubes. This is done so that you cannot get pregnant. When the fallopian tubes are closed, the eggs that your ovaries release cannot enter the uterus, and sperm cannot reach the released eggs. A laparoscopic tubal ligation is sometimes called "getting your tubes tied." You should not have this procedure if you want to get pregnant someday or if you are unsure about having more children. Tell a health care provider about:  Any allergies you have.  All medicines you are taking, including vitamins, herbs, eye drops, creams, and over-the-counter medicines.  Any problems you or family members have had with anesthetic medicines.  Any blood disorders you have.  Any surgeries you have had.  Any medical conditions you have.  Whether you are pregnant or may be pregnant.  Any past pregnancies. What are the risks? Generally, this is a safe procedure. However, problems may occur, including:  Infection.  Bleeding.  Injury to surrounding organs.  Side effects from anesthetics.  Failure of the procedure. This procedure can increase your risk of a kind of pregnancy in which a fertilized egg attaches to the outside of the uterus (ectopic pregnancy). What happens before the procedure?  Ask your health care provider about: ? Changing or stopping your regular medicines. This is especially important if you are taking diabetes medicines or blood thinners. ? Taking medicines such as aspirin and ibuprofen. These medicines can thin your blood. Do not take these medicines before your procedure if your health care provider instructs you not to.  Follow instructions from your health care provider about eating and drinking restrictions.  Plan to have  someone take you home after the procedure.  If you go home right after the procedure, plan to have someone with you for 24 hours. What happens during the procedure?      You will be given one or more of the following: ? A medicine to help you relax (sedative). ? A medicine to numb the area (local anesthetic). ? A medicine to make you fall asleep (general anesthetic). ? A medicine that is injected into an area of your body to numb everything below the injection site (regional anesthetic).  An IV tube will be inserted into one of your veins. It will be used to give you medicines and fluids during the procedure.  Your bladder may be emptied with a small tube (catheter).  If you have been given a general anesthetic, a tube will be put down your throat to help you breathe.  Two small cuts (incisions) will be made in your lower abdomen and near your belly button.  Your abdomen will be inflated with a gas. This will let the surgeon see better and will give the surgeon room to work.  A thin, lighted tube (laparoscope) with a camera attached will be inserted into your abdomen through one of the incisions. Small instruments will be inserted through the other incision.  The fallopian tubes will be tied off,  burned (cauterized), or blocked with a clip, ring, or clamp. A small portion in the center of each fallopian tube may be removed.  The gas will be released from the abdomen.  The incisions will be closed with stitches (sutures).  A bandage (dressing) will be placed over the incisions. The procedure may vary among health care providers and hospitals. What happens after the procedure?  Your blood pressure, heart rate, breathing rate, and blood oxygen level will be monitored often until the medicines you were given have worn off.  You will be given medicine to help with pain, nausea, and vomiting as needed. This information is not intended to replace advice given to you by your health  care provider. Make sure you discuss any questions you have with your health care provider. Document Released: 09/23/2000 Document Revised: 02/11/2017 Document Reviewed: 05/28/2015 Elsevier Interactive Patient Education  2019 [employer].

## 2018-12-11 LAB — URINALYSIS, COMPLETE W/RFL CULTURE
Bilirubin Urine: NEGATIVE
Glucose, UA: NEGATIVE
Hgb urine dipstick: NEGATIVE
Hyaline Cast: NONE SEEN /LPF
Ketones, ur: NEGATIVE
Nitrites, Initial: NEGATIVE
Protein, ur: NEGATIVE
RBC / HPF: NONE SEEN /HPF (ref 0–2)
Specific Gravity, Urine: 1.02 (ref 1.001–1.03)
pH: 7 (ref 5.0–8.0)

## 2018-12-11 LAB — URINE CULTURE
MICRO NUMBER:: 559895
Result:: NO GROWTH
SPECIMEN QUALITY:: ADEQUATE

## 2018-12-11 LAB — CULTURE INDICATED

## 2018-12-15 MED FILL — FLUCONAZOLE 150 MG TABS: 150 | 3 days supply | Qty: 2 | Fill #1

## 2018-12-29 ENCOUNTER — Ambulatory Visit (INDEPENDENT_AMBULATORY_CARE_PROVIDER_SITE_OTHER): Payer: BC Managed Care – PPO | Admitting: Nurse Practitioner

## 2018-12-29 ENCOUNTER — Encounter: Payer: Self-pay | Admitting: Plastic Surgery

## 2018-12-29 ENCOUNTER — Other Ambulatory Visit: Payer: Self-pay

## 2018-12-29 VITALS — BP 109/79 | HR 78 | Temp 98.3°F | Ht 64.0 in | Wt 155.0 lb

## 2018-12-29 DIAGNOSIS — C50412 Malignant neoplasm of upper-outer quadrant of left female breast: Secondary | ICD-10-CM

## 2018-12-29 DIAGNOSIS — Z9013 Acquired absence of bilateral breasts and nipples: Secondary | ICD-10-CM

## 2018-12-29 DIAGNOSIS — Z171 Estrogen receptor negative status [ER-]: Secondary | ICD-10-CM

## 2018-12-29 MED ORDER — DIAZEPAM 2 MG PO TABS: 2.0000 mg | ORAL_TABLET | Freq: Two times a day (BID) | ORAL | 0 refills | Status: DC | PRN

## 2018-12-29 MED ORDER — ONDANSETRON HCL 4 MG PO TABS: 4.0000 mg | ORAL_TABLET | Freq: Three times a day (TID) | ORAL | 0 refills | Status: AC | PRN

## 2018-12-29 MED ORDER — CEPHALEXIN 500 MG PO CAPS: 500.0000 mg | ORAL_CAPSULE | Freq: Four times a day (QID) | ORAL | 0 refills | Status: AC

## 2018-12-29 MED ORDER — HYDROCODONE-ACETAMINOPHEN 5-325 MG PO TABS: 1.0000 | ORAL_TABLET | Freq: Three times a day (TID) | ORAL | 0 refills | Status: AC | PRN

## 2018-12-29 NOTE — Progress Notes (Signed)
History of Present Illness: Samantha Mcneil is a 33 y.o.  female  with a history of left sidedt breast cancer. She was BRCA positive. She underwent bilateral mastectomies with immediate reconstruction with tissue expander placement. Patient has completed chemotherapy. She presents for preoperative evaluation for upcoming procedure, removal of bilateral tissue expanders with placement of bilateral breast implants, scheduled for 01/13/19 with Dr. Marla Roe. Patient would also like to have her port-a-cath removed at the same time. Patient does not feel her best today. She immediately vomited after drinking spoiled cream yesterday. She hasn't felt quite right since. She is taking Dilfucan for a yeast infection and developed a rash on her thighs after taking the medication.   Past Medical History: Allergies: No Known Allergies  Current Medications:  Current Outpatient Medications:  .  diazepam (VALIUM) 2 MG tablet, TAKE 1 TABLET (2 MG TOTAL) BY MOUTH EVERY 6 HOURS AS NEEDED FOR ANXIETY., Disp: 30 tablet, Rfl: 0 .  fluconazole (DIFLUCAN) 150 MG tablet, Take one today and repeat in 3 days if needed., Disp: 2 tablet, Rfl: 1 .  ibuprofen (ADVIL,MOTRIN) 200 MG tablet, Take 200 mg by mouth every 6 (six) hours as needed., Disp: , Rfl:  .  nystatin-triamcinolone ointment (MYCOLOG), Apply 1 application topically 2 (two) times daily., Disp: 30 g, Rfl: 0 .  Tioconazole (MONISTAT 1-DAY VA), Place vaginally., Disp: , Rfl:  .  venlafaxine XR (EFFEXOR-XR) 75 MG 24 hr capsule, Take 1 capsule (75 mg total) by mouth daily with breakfast., Disp: 90 capsule, Rfl: 3  Past Medical Problems: Past Medical History:  Diagnosis Date  . Anxiety   . Depression   . Family history of breast cancer   . Family history of liver cancer   . Family history of lung cancer   . Headache    migraines  . History of kidney stones     Past Surgical History: Past Surgical History:  Procedure Laterality Date  . BREAST  RECONSTRUCTION WITH PLACEMENT OF TISSUE EXPANDER AND FLEX HD (ACELLULAR HYDRATED DERMIS) Bilateral 10/13/2018   Procedure: BILATERAL IMMEDIATE BREAST RECONSTRUCTION WITH PLACEMENT OF TISSUE EXPANDER AND FLEX HD (ACELLULAR HYDRATED DERMIS);  Surgeon: Wallace Going, DO;  Location: [facility];  Service: Plastics;  Laterality: Bilateral;  . CESAREAN SECTION    . MASTECTOMY W/ SENTINEL NODE BIOPSY Bilateral 10/13/2018   Procedure: BILATERAL SKIN SPARING MASTECTOMIES WITH LEFT SENTINEL  LYMPH NODE BIOPSY.;  Surgeon: [name], MD;  Location: [place];  Service: General;  Laterality: Bilateral;  . OTHER SURGICAL HISTORY    . PORTACATH PLACEMENT Left 04/01/2018   Procedure: INSERTION PORT-A-CATH;  Surgeon: [name], MD;  Location: [facility];  Service: General;  Laterality: Left;    The patient has had anesthesia or sedation in the past.   The patient HAS had problems with anesthesia: Patient became very dizzy while in the PACU and reportedly passed out. The patient does not have a family history of anesthesia problems.   Social History: Social History   Socioeconomic History  . Marital status: Married    Spouse name: Not on file  . Number of children: Not on file  . Years of education: Not on file  . Highest education level: Not on file  Occupational History  . Not on file  Social Needs  . Financial resource strain: Not on file  . Food insecurity    Worry: Not on file    Inability: Not on file  . Transportation needs  Medical: Not on file    Non-medical: Not on file  Tobacco Use  . Smoking status: Never Smoker  . Smokeless tobacco: Never Used  Substance and Sexual Activity  . Alcohol use: Yes    Comment: Rare  . Drug use: Never  . Sexual activity: Yes    Birth control/protection: None    Comment: 1st intercourse 33 yo-Fewer than 5 partners  Lifestyle  . Physical activity    Days per week: Not on file    Minutes per  session: Not on file  . Stress: Not on file  Relationships  . Social Herbalist on phone: Not on file    Gets together: Not on file    Attends religious service: Not on file    Active member of club or organization: Not on file    Attends meetings of clubs or organizations: Not on file    Relationship status: Not on file  . Intimate partner violence    Fear of current or ex partner: Not on file    Emotionally abused: Not on file    Physically abused: Not on file    Forced sexual activity: Not on file  Other Topics Concern  . Not on file  Social History Narrative  . Not on file    Family History: Family History  Problem Relation Age of Onset  . Heart disease Father        d. 79  . Breast cancer Maternal Aunt        d. 33  . Breast cancer Cousin        dx 73  . Liver cancer Maternal Grandmother        d. 42  . Lung cancer Maternal Grandfather        d. 47  . Breast cancer Cousin        dx 60s    Review of Systems: General ROS: dizzy Dermatological XIP:JASN on legs Cardiovascular ROS: negative ENT ROS: negative Gastrointestinal ROS: nausea yesterday  Physical Exam: Vital Signs BP 109/79 (BP Location: Left Arm, Patient Position: Sitting, Cuff Size: Normal)   Pulse 78   Temp 98.3 F (36.8 C) (Oral)   Ht '5\' 4"'  (1.626 m)   Wt 155 lb (70.3 kg)   SpO2 99%   BMI 26.61 kg/m  General: alert, active, no acute distress HEENT: teeth intact Neck: supple, full ROM Chest: symmetrical rise and fall, port-a-cath palpated in left upper chest Breast: bilateral mastectomies with expanders intact, healed scars Cardiac: regular rate and rhythm Abdomen: soft, non-distended, non-tender Musculoskeletal: MAE x4 Neuro: awake, alert, normal strength and tone  Assessment: Samantha Mcneil is a 33 y.o. female  with a history of left sidedt breast cancer. She is s/p bilateral mastectomies with tissue expander placement. She has done very well with expander filling. Patient  had a syncope episode while in PACU after her last surgery.   Plan: She is scheduled for removal of bilateral tissue expanders with placement of bilateral breast implants,on 01/13/19 with Dr. Marla Roe. Discussed pre-procedure COVID-19 testing. Discussed post-op care. Discussed Risks, benefits, and alternatives of procedure discussed, questions answered.    Electronically signed by: Alfredo Batty, NP 12/29/2018 3:58 PM

## 2018-12-29 NOTE — Addendum Note (Signed)
Addended by: Wallace Going on: 12/29/2018 06:20 PM   Modules accepted: Orders

## 2018-12-29 NOTE — H&P (View-Only) (Signed)
History of Present Illness: Samantha Mcneil is a 33 y.o.  female  with a history of left sidedt breast cancer. She was BRCA positive. She underwent bilateral mastectomies with immediate reconstruction with tissue expander placement. Patient has completed chemotherapy. She presents for preoperative evaluation for upcoming procedure, removal of bilateral tissue expanders with placement of bilateral breast implants, scheduled for 01/13/19 with Dr. Marla Roe. Patient would also like to have her port-a-cath removed at the same time. Patient does not feel her best today. She immediately vomited after drinking spoiled cream yesterday. She hasn't felt quite right since. She is taking Dilfucan for a yeast infection and developed a rash on her thighs after taking the medication.   Past Medical History: Allergies: No Known Allergies  Current Medications:  Current Outpatient Medications:  .  diazepam (VALIUM) 2 MG tablet, TAKE 1 TABLET (2 MG TOTAL) BY MOUTH EVERY 6 HOURS AS NEEDED FOR ANXIETY., Disp: 30 tablet, Rfl: 0 .  fluconazole (DIFLUCAN) 150 MG tablet, Take one today and repeat in 3 days if needed., Disp: 2 tablet, Rfl: 1 .  ibuprofen (ADVIL,MOTRIN) 200 MG tablet, Take 200 mg by mouth every 6 (six) hours as needed., Disp: , Rfl:  .  nystatin-triamcinolone ointment (MYCOLOG), Apply 1 application topically 2 (two) times daily., Disp: 30 g, Rfl: 0 .  Tioconazole (MONISTAT 1-DAY VA), Place vaginally., Disp: , Rfl:  .  venlafaxine XR (EFFEXOR-XR) 75 MG 24 hr capsule, Take 1 capsule (75 mg total) by mouth daily with breakfast., Disp: 90 capsule, Rfl: 3  Past Medical Problems: Past Medical History:  Diagnosis Date  . Anxiety   . Depression   . Family history of breast cancer   . Family history of liver cancer   . Family history of lung cancer   . Headache    migraines  . History of kidney stones     Past Surgical History: Past Surgical History:  Procedure Laterality Date  . BREAST  RECONSTRUCTION WITH PLACEMENT OF TISSUE EXPANDER AND FLEX HD (ACELLULAR HYDRATED DERMIS) Bilateral 10/13/2018   Procedure: BILATERAL IMMEDIATE BREAST RECONSTRUCTION WITH PLACEMENT OF TISSUE EXPANDER AND FLEX HD (ACELLULAR HYDRATED DERMIS);  Surgeon: Wallace Going, DO;  Location: [facility] [facility];  Service: Plastics;  Laterality: Bilateral;  . CESAREAN SECTION    . MASTECTOMY W/ SENTINEL NODE BIOPSY Bilateral 10/13/2018   Procedure: BILATERAL SKIN SPARING MASTECTOMIES WITH LEFT SENTINEL  LYMPH NODE BIOPSY.;  Surgeon: [name], MD;  Location: [place] [facility];  Service: General;  Laterality: Bilateral;  . OTHER SURGICAL HISTORY    . PORTACATH PLACEMENT Left 04/01/2018   Procedure: INSERTION PORT-A-CATH;  Surgeon: [name], MD;  Location: [place];  Service: General;  Laterality: Left;    The patient has had anesthesia or sedation in the past.   The patient HAS had problems with anesthesia: Patient became very dizzy while in the PACU and reportedly passed out. The patient does not have a family history of anesthesia problems.   Social History: Social History   Socioeconomic History  . Marital status: Married    Spouse name: Not on file  . Number of children: Not on file  . Years of education: Not on file  . Highest education level: Not on file  Occupational History  . Not on file  Social Needs  . Financial resource strain: Not on file  . Food insecurity    Worry: Not on file    Inability: Not on file  . Transportation needs  Medical: Not on file    Non-medical: Not on file  Tobacco Use  . Smoking status: Never Smoker  . Smokeless tobacco: Never Used  Substance and Sexual Activity  . Alcohol use: Yes    Comment: Rare  . Drug use: Never  . Sexual activity: Yes    Birth control/protection: None    Comment: 1st intercourse 33 yo-Fewer than 5 partners  Lifestyle  . Physical activity    Days per week: Not on file    Minutes per  session: Not on file  . Stress: Not on file  Relationships  . Social Herbalist on phone: Not on file    Gets together: Not on file    Attends religious service: Not on file    Active member of club or organization: Not on file    Attends meetings of clubs or organizations: Not on file    Relationship status: Not on file  . Intimate partner violence    Fear of current or ex partner: Not on file    Emotionally abused: Not on file    Physically abused: Not on file    Forced sexual activity: Not on file  Other Topics Concern  . Not on file  Social History Narrative  . Not on file    Family History: Family History  Problem Relation Age of Onset  . Heart disease Father        d. 47  . Breast cancer Maternal Aunt        d. 76  . Breast cancer Cousin        dx 15  . Liver cancer Maternal Grandmother        d. 34  . Lung cancer Maternal Grandfather        d. 41  . Breast cancer Cousin        dx 60s    Review of Systems: General ROS: dizzy Dermatological RCB:ULAG on legs Cardiovascular ROS: negative ENT ROS: negative Gastrointestinal ROS: nausea yesterday  Physical Exam: Vital Signs BP 109/79 (BP Location: Left Arm, Patient Position: Sitting, Cuff Size: Normal)   Pulse 78   Temp 98.3 F (36.8 C) (Oral)   Ht '5\' 4"'  (1.626 m)   Wt 155 lb (70.3 kg)   SpO2 99%   BMI 26.61 kg/m  General: alert, active, no acute distress HEENT: teeth intact Neck: supple, full ROM Chest: symmetrical rise and fall, port-a-cath palpated in left upper chest Breast: bilateral mastectomies with expanders intact, healed scars Cardiac: regular rate and rhythm Abdomen: soft, non-distended, non-tender Musculoskeletal: MAE x4 Neuro: awake, alert, normal strength and tone  Assessment: Samantha Mcneil is a 33 y.o. female  with a history of left sidedt breast cancer. She is s/p bilateral mastectomies with tissue expander placement. She has done very well with expander filling. Patient  had a syncope episode while in PACU after her last surgery.   Plan: She is scheduled for removal of bilateral tissue expanders with placement of bilateral breast implants,on 01/13/19 with Dr. Marla Roe. Discussed pre-procedure COVID-19 testing. Discussed post-op care. Discussed Risks, benefits, and alternatives of procedure discussed, questions answered.    Electronically signed by: Alfredo Batty, NP 12/29/2018 3:58 PM

## 2018-12-30 MED FILL — HYDROCODON-APAP 5-325: 5-325 | 7 days supply | Qty: 21 | Fill #0

## 2018-12-30 MED FILL — ONDANSETRON HCL 4 MG TABLET: 4 | 5 days supply | Qty: 15 | Fill #0

## 2018-12-30 MED FILL — diazePAM 2 MG TABS: 2 | 15 days supply | Qty: 30 | Fill #0

## 2018-12-30 MED FILL — CEPHALEXIN 500 MG CAPSULE: 500 | 3 days supply | Qty: 12 | Fill #0

## 2019-01-05 ENCOUNTER — Other Ambulatory Visit: Payer: Self-pay

## 2019-01-05 ENCOUNTER — Encounter (HOSPITAL_BASED_OUTPATIENT_CLINIC_OR_DEPARTMENT_OTHER): Payer: Self-pay | Admitting: *Deleted

## 2019-01-06 MED FILL — VENLAFAXINE HCL ER 75 MG CA: 75 | 90 days supply | Qty: 90 | Fill #1

## 2019-01-09 ENCOUNTER — Other Ambulatory Visit (HOSPITAL_COMMUNITY)
Admission: RE | Admit: 2019-01-09 | Discharge: 2019-01-09 | Disposition: A | Discharge status: Home / Self Care | Payer: BC Managed Care – PPO | Source: Ambulatory Visit | Attending: Plastic Surgery | Admitting: Plastic Surgery

## 2019-01-09 DIAGNOSIS — Z1159 Encounter for screening for other viral diseases: Secondary | ICD-10-CM | POA: Insufficient documentation

## 2019-01-09 DIAGNOSIS — Z01812 Encounter for preprocedural laboratory examination: Secondary | ICD-10-CM | POA: Diagnosis not present

## 2019-01-09 LAB — SARS CORONAVIRUS 2 (TAT 6-24 HRS): SARS Coronavirus 2: NEGATIVE

## 2019-01-11 ENCOUNTER — Telehealth: Payer: Self-pay | Admitting: Adult Health

## 2019-01-11 NOTE — Telephone Encounter (Signed)
When I called patient she was speaking english and didn't need interpreter

## 2019-01-11 NOTE — Telephone Encounter (Signed)
I talk with patient regarding video visit °

## 2019-01-13 ENCOUNTER — Encounter (HOSPITAL_BASED_OUTPATIENT_CLINIC_OR_DEPARTMENT_OTHER)
Admission: RE | Disposition: A | Discharge status: Home / Self Care | Payer: Self-pay | Source: Home / Self Care | Attending: Plastic Surgery

## 2019-01-13 ENCOUNTER — Ambulatory Visit (HOSPITAL_BASED_OUTPATIENT_CLINIC_OR_DEPARTMENT_OTHER): Payer: BC Managed Care – PPO | Admitting: Anesthesiology

## 2019-01-13 ENCOUNTER — Ambulatory Visit (HOSPITAL_BASED_OUTPATIENT_CLINIC_OR_DEPARTMENT_OTHER)
Admission: RE | Admit: 2019-01-13 | Discharge: 2019-01-13 | Disposition: A | Discharge status: Home / Self Care | Payer: BC Managed Care – PPO | Attending: Plastic Surgery | Admitting: Plastic Surgery

## 2019-01-13 ENCOUNTER — Other Ambulatory Visit: Payer: Self-pay

## 2019-01-13 ENCOUNTER — Encounter (HOSPITAL_BASED_OUTPATIENT_CLINIC_OR_DEPARTMENT_OTHER): Payer: Self-pay | Admitting: *Deleted

## 2019-01-13 DIAGNOSIS — Z841 Family history of disorders of kidney and ureter: Secondary | ICD-10-CM | POA: Diagnosis not present

## 2019-01-13 DIAGNOSIS — Z791 Long term (current) use of non-steroidal anti-inflammatories (NSAID): Secondary | ICD-10-CM | POA: Diagnosis not present

## 2019-01-13 DIAGNOSIS — Z820 Family history of epilepsy and other diseases of the nervous system: Secondary | ICD-10-CM | POA: Insufficient documentation

## 2019-01-13 DIAGNOSIS — F419 Anxiety disorder, unspecified: Secondary | ICD-10-CM | POA: Insufficient documentation

## 2019-01-13 DIAGNOSIS — Z800 Family history of malignant neoplasm of digestive organs: Secondary | ICD-10-CM | POA: Insufficient documentation

## 2019-01-13 DIAGNOSIS — Z9013 Acquired absence of bilateral breasts and nipples: Secondary | ICD-10-CM

## 2019-01-13 DIAGNOSIS — Z421 Encounter for breast reconstruction following mastectomy: Secondary | ICD-10-CM | POA: Insufficient documentation

## 2019-01-13 DIAGNOSIS — Z803 Family history of malignant neoplasm of breast: Secondary | ICD-10-CM | POA: Insufficient documentation

## 2019-01-13 DIAGNOSIS — Z801 Family history of malignant neoplasm of trachea, bronchus and lung: Secondary | ICD-10-CM | POA: Diagnosis not present

## 2019-01-13 DIAGNOSIS — Z853 Personal history of malignant neoplasm of breast: Secondary | ICD-10-CM

## 2019-01-13 DIAGNOSIS — F329 Major depressive disorder, single episode, unspecified: Secondary | ICD-10-CM | POA: Diagnosis not present

## 2019-01-13 DIAGNOSIS — Z79899 Other long term (current) drug therapy: Secondary | ICD-10-CM | POA: Insufficient documentation

## 2019-01-13 DIAGNOSIS — Z9221 Personal history of antineoplastic chemotherapy: Secondary | ICD-10-CM | POA: Insufficient documentation

## 2019-01-13 HISTORY — PX: REMOVAL OF BILATERAL TISSUE EXPANDERS WITH PLACEMENT OF BILATERAL BREAST IMPLANTS: SHX6431

## 2019-01-13 HISTORY — PX: PORT-A-CATH REMOVAL: SHX5289

## 2019-01-13 LAB — POCT PREGNANCY, URINE: Preg Test, Ur: NEGATIVE

## 2019-01-13 SURGERY — REMOVAL, TISSUE EXPANDER, BREAST, BILATERAL, WITH BILATERAL IMPLANT IMPLANT INSERTION
Anesthesia: General | Site: Breast

## 2019-01-13 MED ORDER — SODIUM CHLORIDE 0.9 % IV SOLN
INTRAVENOUS | Status: AC
  Filled 2019-01-13: qty 500000

## 2019-01-13 MED ORDER — SUGAMMADEX SODIUM 200 MG/2ML IV SOLN
INTRAVENOUS | Status: DC | PRN
  Administered 2019-01-13: 200 mg via INTRAVENOUS

## 2019-01-13 MED ORDER — PROPOFOL 10 MG/ML IV BOLUS
INTRAVENOUS | Status: DC | PRN
  Administered 2019-01-13: 150 mg via INTRAVENOUS
  Administered 2019-01-13: 50 mg via INTRAVENOUS

## 2019-01-13 MED ORDER — CEFAZOLIN SODIUM-DEXTROSE 2-4 GM/100ML-% IV SOLN
2.0000 g | INTRAVENOUS | Status: AC
  Administered 2019-01-13: 2 g via INTRAVENOUS

## 2019-01-13 MED ORDER — SCOPOLAMINE 1 MG/3DAYS TD PT72: 1.0000 | MEDICATED_PATCH | Freq: Once | TRANSDERMAL | Status: DC

## 2019-01-13 MED ORDER — FENTANYL CITRATE (PF) 100 MCG/2ML IJ SOLN
INTRAMUSCULAR | Status: AC
  Filled 2019-01-13: qty 2

## 2019-01-13 MED ORDER — SUCCINYLCHOLINE CHLORIDE 200 MG/10ML IV SOSY
PREFILLED_SYRINGE | INTRAVENOUS | Status: AC
  Filled 2019-01-13: qty 10

## 2019-01-13 MED ORDER — SUGAMMADEX SODIUM 200 MG/2ML IV SOLN: INTRAVENOUS | Status: DC | PRN

## 2019-01-13 MED ORDER — LIDOCAINE 2% (20 MG/ML) 5 ML SYRINGE
INTRAMUSCULAR | Status: AC
  Filled 2019-01-13: qty 5

## 2019-01-13 MED ORDER — BUPIVACAINE-EPINEPHRINE 0.25% -1:200000 IJ SOLN
INTRAMUSCULAR | Status: DC | PRN
  Administered 2019-01-13: 24 mL

## 2019-01-13 MED ORDER — CEFAZOLIN SODIUM-DEXTROSE 2-4 GM/100ML-% IV SOLN
INTRAVENOUS | Status: AC
  Filled 2019-01-13: qty 100

## 2019-01-13 MED ORDER — ONDANSETRON HCL 4 MG/2ML IJ SOLN: 4.0000 mg | Freq: Once | INTRAMUSCULAR | Status: DC | PRN

## 2019-01-13 MED ORDER — CHLORHEXIDINE GLUCONATE CLOTH 2 % EX PADS: 6.0000 | MEDICATED_PAD | Freq: Once | CUTANEOUS | Status: DC

## 2019-01-13 MED ORDER — KETOROLAC TROMETHAMINE 30 MG/ML IJ SOLN
INTRAMUSCULAR | Status: DC | PRN
  Administered 2019-01-13: 30 mg via INTRAVENOUS

## 2019-01-13 MED ORDER — LIDOCAINE HCL (CARDIAC) PF 100 MG/5ML IV SOSY
PREFILLED_SYRINGE | INTRAVENOUS | Status: DC | PRN
  Administered 2019-01-13: 50 mg via INTRAVENOUS

## 2019-01-13 MED ORDER — EPHEDRINE 5 MG/ML INJ
INTRAVENOUS | Status: AC
  Filled 2019-01-13: qty 10

## 2019-01-13 MED ORDER — ROCURONIUM BROMIDE 100 MG/10ML IV SOLN
INTRAVENOUS | Status: DC | PRN
  Administered 2019-01-13: 50 mg via INTRAVENOUS

## 2019-01-13 MED ORDER — ROCURONIUM BROMIDE 10 MG/ML (PF) SYRINGE
PREFILLED_SYRINGE | INTRAVENOUS | Status: AC
  Filled 2019-01-13: qty 10

## 2019-01-13 MED ORDER — MIDAZOLAM HCL 2 MG/2ML IJ SOLN
INTRAMUSCULAR | Status: AC
  Filled 2019-01-13: qty 2

## 2019-01-13 MED ORDER — FENTANYL CITRATE (PF) 100 MCG/2ML IJ SOLN
50.0000 ug | INTRAMUSCULAR | Status: DC | PRN
  Administered 2019-01-13: 100 ug via INTRAVENOUS

## 2019-01-13 MED ORDER — ONDANSETRON HCL 4 MG/2ML IJ SOLN
INTRAMUSCULAR | Status: DC | PRN
  Administered 2019-01-13: 4 mg via INTRAVENOUS

## 2019-01-13 MED ORDER — FENTANYL CITRATE (PF) 100 MCG/2ML IJ SOLN
25.0000 ug | INTRAMUSCULAR | Status: DC | PRN
  Administered 2019-01-13: 25 ug via INTRAVENOUS

## 2019-01-13 MED ORDER — OXYCODONE HCL 5 MG PO TABS: 5.0000 mg | ORAL_TABLET | Freq: Once | ORAL | Status: DC | PRN

## 2019-01-13 MED ORDER — ONDANSETRON HCL 4 MG/2ML IJ SOLN
INTRAMUSCULAR | Status: AC
  Filled 2019-01-13: qty 2

## 2019-01-13 MED ORDER — LACTATED RINGERS IV SOLN
INTRAVENOUS | Status: DC
  Administered 2019-01-13 (×2): via INTRAVENOUS

## 2019-01-13 MED ORDER — SODIUM CHLORIDE 0.9 % IV SOLN
INTRAVENOUS | Status: DC | PRN
  Administered 2019-01-13: 500 mL

## 2019-01-13 MED ORDER — OXYCODONE HCL 5 MG/5ML PO SOLN: 5.0000 mg | Freq: Once | ORAL | Status: DC | PRN

## 2019-01-13 MED ORDER — KETOROLAC TROMETHAMINE 30 MG/ML IJ SOLN
INTRAMUSCULAR | Status: AC
  Filled 2019-01-13: qty 1

## 2019-01-13 MED ORDER — MIDAZOLAM HCL 2 MG/2ML IJ SOLN
1.0000 mg | INTRAMUSCULAR | Status: DC | PRN
  Administered 2019-01-13: 2 mg via INTRAVENOUS

## 2019-01-13 MED ORDER — DEXAMETHASONE SODIUM PHOSPHATE 4 MG/ML IJ SOLN
INTRAMUSCULAR | Status: DC | PRN
  Administered 2019-01-13: 10 mg via INTRAVENOUS

## 2019-01-13 SURGICAL SUPPLY — 73 items
BAG DECANTER FOR FLEXI CONT (MISCELLANEOUS) ×3 IMPLANT
BINDER BREAST LRG (GAUZE/BANDAGES/DRESSINGS) IMPLANT
BINDER BREAST MEDIUM (GAUZE/BANDAGES/DRESSINGS) IMPLANT
BINDER BREAST XLRG (GAUZE/BANDAGES/DRESSINGS) ×3 IMPLANT
BINDER BREAST XXLRG (GAUZE/BANDAGES/DRESSINGS) IMPLANT
BIOPATCH RED 1 DISK 7.0 (GAUZE/BANDAGES/DRESSINGS) IMPLANT
BLADE CLIPPER SURG (BLADE) IMPLANT
BLADE HEX COATED 2.75 (ELECTRODE) ×3 IMPLANT
BLADE SURG 15 STRL LF DISP TIS (BLADE) ×4 IMPLANT
BLADE SURG 15 STRL SS (BLADE) ×2
BNDG GAUZE ELAST 4 BULKY (GAUZE/BANDAGES/DRESSINGS) IMPLANT
CANISTER SUCT 1200ML W/VALVE (MISCELLANEOUS) ×3 IMPLANT
CHLORAPREP W/TINT 26 (MISCELLANEOUS) ×3 IMPLANT
CORD BIPOLAR FORCEPS 12FT (ELECTRODE) IMPLANT
COVER BACK TABLE REUSABLE LG (DRAPES) ×3 IMPLANT
COVER MAYO STAND REUSABLE (DRAPES) ×3 IMPLANT
COVER WAND RF STERILE (DRAPES) IMPLANT
DECANTER SPIKE VIAL GLASS SM (MISCELLANEOUS) IMPLANT
DERMABOND ADVANCED (GAUZE/BANDAGES/DRESSINGS) ×2
DERMABOND ADVANCED .7 DNX12 (GAUZE/BANDAGES/DRESSINGS) ×4 IMPLANT
DRAIN CHANNEL 19F RND (DRAIN) IMPLANT
DRAPE LAPAROSCOPIC ABDOMINAL (DRAPES) ×3 IMPLANT
DRAPE LAPAROTOMY 100X72 PEDS (DRAPES) ×3 IMPLANT
DRSG PAD ABDOMINAL 8X10 ST (GAUZE/BANDAGES/DRESSINGS) ×6 IMPLANT
DRSG TEGADERM 2-3/8X2-3/4 SM (GAUZE/BANDAGES/DRESSINGS) IMPLANT
ELECT BLADE 4.0 EZ CLEAN MEGAD (MISCELLANEOUS) ×3
ELECT COATED BLADE 2.86 ST (ELECTRODE) IMPLANT
ELECT REM PT RETURN 9FT ADLT (ELECTROSURGICAL) ×3
ELECTRODE BLDE 4.0 EZ CLN MEGD (MISCELLANEOUS) ×2 IMPLANT
ELECTRODE REM PT RTRN 9FT ADLT (ELECTROSURGICAL) ×2 IMPLANT
EVACUATOR SILICONE 100CC (DRAIN) IMPLANT
GAUZE SPONGE 4X4 12PLY STRL LF (GAUZE/BANDAGES/DRESSINGS) IMPLANT
GLOVE BIO SURGEON STRL SZ 6.5 (GLOVE) ×12 IMPLANT
GLOVE BIO SURGEON STRL SZ7 (GLOVE) ×6 IMPLANT
GOWN STRL REUS W/ TWL LRG LVL3 (GOWN DISPOSABLE) ×8 IMPLANT
GOWN STRL REUS W/TWL LRG LVL3 (GOWN DISPOSABLE) ×4
IMPL BREAST SILICONE 425CC (Breast) ×4 IMPLANT
IMPLANT BREAST SILICONE 425CC (Breast) ×4 IMPLANT
IMPLANT SILICONE 425CC (Breast) ×2 IMPLANT
IV NS 1000ML (IV SOLUTION)
IV NS 1000ML BAXH (IV SOLUTION) IMPLANT
IV NS 500ML (IV SOLUTION)
IV NS 500ML BAXH (IV SOLUTION) IMPLANT
KIT FILL SYSTEM UNIVERSAL (SET/KITS/TRAYS/PACK) IMPLANT
NDL SAFETY ECLIPSE 18X1.5 (NEEDLE) ×2 IMPLANT
NEEDLE HYPO 18GX1.5 SHARP (NEEDLE) ×1
NEEDLE HYPO 25X1 1.5 SAFETY (NEEDLE) ×3 IMPLANT
NS IRRIG 1000ML POUR BTL (IV SOLUTION) ×3 IMPLANT
PACK BASIN DAY SURGERY FS (CUSTOM PROCEDURE TRAY) ×3 IMPLANT
PENCIL BUTTON HOLSTER BLD 10FT (ELECTRODE) ×3 IMPLANT
PIN SAFETY STERILE (MISCELLANEOUS) IMPLANT
SIZER BREAST REUSE 425CC (SIZER) ×3
SIZER BRST REUSE 425CC (SIZER) ×2 IMPLANT
SLEEVE SCD COMPRESS KNEE MED (MISCELLANEOUS) ×3 IMPLANT
SPONGE GAUZE 2X2 8PLY STRL LF (GAUZE/BANDAGES/DRESSINGS) IMPLANT
SPONGE LAP 18X18 RF (DISPOSABLE) ×6 IMPLANT
STRIP CLOSURE SKIN 1/2X4 (GAUZE/BANDAGES/DRESSINGS) ×3 IMPLANT
STRIP SUTURE WOUND CLOSURE 1/2 (SUTURE) IMPLANT
SUT MNCRL AB 4-0 PS2 18 (SUTURE) ×3 IMPLANT
SUT MON AB 3-0 SH 27 (SUTURE) ×4
SUT MON AB 3-0 SH27 (SUTURE) ×8 IMPLANT
SUT MON AB 5-0 PS2 18 (SUTURE) ×3 IMPLANT
SUT PDS AB 2-0 CT2 27 (SUTURE) IMPLANT
SUT VIC AB 3-0 SH 27 (SUTURE)
SUT VIC AB 3-0 SH 27X BRD (SUTURE) IMPLANT
SUT VICRYL 4-0 PS2 18IN ABS (SUTURE) IMPLANT
SYR BULB IRRIGATION 50ML (SYRINGE) ×3 IMPLANT
SYR CONTROL 10ML LL (SYRINGE) ×3 IMPLANT
TOWEL GREEN STERILE FF (TOWEL DISPOSABLE) ×6 IMPLANT
TRAY DSU PREP LF (CUSTOM PROCEDURE TRAY) ×3 IMPLANT
TUBE CONNECTING 20X1/4 (TUBING) ×3 IMPLANT
UNDERPAD 30X30 (UNDERPADS AND DIAPERS) ×6 IMPLANT
YANKAUER SUCT BULB TIP NO VENT (SUCTIONS) ×3 IMPLANT

## 2019-01-13 NOTE — Transfer of Care (Signed)
Immediate Anesthesia Transfer of Care Note  Patient: Samantha Mcneil  Procedure(s) Performed: REMOVAL OF BILATERAL TISSUE EXPANDERS WITH PLACEMENT OF BILATERAL BREAST IMPLANTS (Bilateral Breast) REMOVAL PORT-A-CATH (N/A Breast)  Patient Location: PACU  Anesthesia Type:General  Level of Consciousness: awake, alert  and oriented  Airway & Oxygen Therapy: Patient Spontanous Breathing and Patient connected to face mask oxygen  Post-op Assessment: Report given to RN and Post -op Vital signs reviewed and stable  Post vital signs: Reviewed and stable  Last Vitals:  Vitals Value Taken Time  BP    Temp    Pulse 95 01/13/19 1432  Resp 14 01/13/19 1432  SpO2 100 % 01/13/19 1432  Vitals shown include unvalidated device data.  Last Pain:  Vitals:   01/13/19 1134  TempSrc: Oral  PainSc: 0-No pain         Complications: No apparent anesthesia complications

## 2019-01-13 NOTE — Op Note (Signed)
Op report Bilateral Exchange   DATE OF OPERATION: 01/13/2019  LOCATION: [facility]  SURGICAL DIVISION: Plastic Surgery  PREOPERATIVE DIAGNOSES:  1. History of breast cancer.  2. Acquired absence of bilateral breast.   POSTOPERATIVE DIAGNOSES:  1. History of breast cancer.  2. Acquired absence of bilateral breast.   PROCEDURE:  1. Bilateral exchange of tissue expanders for implants.  2. Bilateral capsulotomies for implant respositioning.  SURGEON: Taccara Bushnell Sanger Doylene Splinter, DO  ASSISTANT: [place], RNFA  ANESTHESIA:  General.   COMPLICATIONS: None.   IMPLANTS: Left - Mentor Smooth Round High Profile Gel 425cc. Ref #[id number].  Serial Number [id number] Right - Mentor Smooth Round High Profile Gel 425cc. Ref #[id number].  Serial Number [id number]  INDICATIONS FOR PROCEDURE:  The patient, Samantha Mcneil, is a 33 y.o. female born on 10-29-1985, is here for treatment after bilateral mastectomies.  She had tissue expanders placed at the time of mastectomies. She now presents for exchange of her expanders for implants.  She requires capsulotomies to better position the implants. MRN: [id number]  CONSENT:  Informed consent was obtained directly from the patient. Risks, benefits and alternatives were fully discussed. Specific risks including but not limited to bleeding, infection, hematoma, seroma, scarring, pain, implant infection, implant extrusion, capsular contracture, asymmetry, wound healing problems, and need for further surgery were all discussed. The patient did have an ample opportunity to have her questions answered to her satisfaction.   DESCRIPTION OF PROCEDURE:  The patient was taken to the operating room. SCDs were placed and IV antibiotics were given. The patient's chest was prepped and draped in a sterile fashion. A time out was performed and the implants to be used were identified.    On the right breast: One percent Lidocaine with epinephrine  was used to infiltrate at the incision site. The old mastectomy scar was incised.  The mastectomy flaps from the superior and inferior flaps were raised a centimeter to minimize tension for the closure. The ADM was split to expose and remove the tissue expander.  Inspection of the pocket showed a normal healthy capsule and good integration of the biologic matrix.  The pocket was irrigated with antibiotic solution.  Lateral capsulotomies were performed to allow for breast pocket expansion.  Measurements were made and a sizer used to confirm adequate pocket size for the implant dimensions.  Hemostasis was ensured with electrocautery. New gloves were placed. The implant was soaked in antibiotic solution and then placed in the pocket and oriented appropriately. The ADM and capsule on the anterior surface were re-closed with a 3-0 Monocryl suture. The remaining skin was closed with 4-0 Monocryl deep dermal and 5-0 Monocryl subcuticular stitches.   On the left breast: The old mastectomy scar was incised.  The mastectomy flaps from the superior and inferior flaps were raised a centimeter to minimize tension for the closure. The ADM was split to expose and remove the tissue expander.  Inspection of the pocket showed a normal healthy capsule and good integration of the biologic matrix.   Lateral capsulotomies were performed to allow for breast pocket expansion.  Measurements were made and a sizer utilized to confirm adequate pocket size for the implant dimensions.  Hemostasis was ensured with the electrocautery.  New gloves were applied. The implant was soaked in antibiotic solution and placed in the pocket and oriented appropriately. The ADM and capsule on the anterior surface were re-closed with a 3-0 Monocryl suture. The remaining skin was closed with 4-0 Monocryl deep dermal  and 5-0 Monocryl subcuticular stitches.  Dermabond was applied to the incision site. A breast binder and ABDs were placed.  The patient was  allowed to wake from anesthesia and taken to the recovery room in satisfactory condition.   The RNFA assisted throughout the case.  The RNFA was essential in retraction and counter traction when needed to make the case progress smoothly.  This retraction and assistance made it possible to see the tissue plans for the procedure.  The assistance was needed for blood control, tissue re-approximation and assisted with closure of the incision site.

## 2019-01-13 NOTE — Anesthesia Preprocedure Evaluation (Addendum)
Anesthesia Evaluation  Patient identified by MRN, date of birth, ID band Patient awake    Reviewed: Allergy & Precautions, NPO status , Patient's Chart, lab work & pertinent test results  History of Anesthesia Complications Negative for: history of anesthetic complications  Airway Mallampati: I  TM Distance: >3 FB Neck ROM: Full    Dental  (+) Teeth Intact   Pulmonary neg pulmonary ROS,    Pulmonary exam normal        Cardiovascular negative cardio ROS Normal cardiovascular exam     Neuro/Psych PSYCHIATRIC DISORDERS Anxiety Depression negative neurological ROS     GI/Hepatic negative GI ROS, Neg liver ROS,   Endo/Other  negative endocrine ROS  Renal/GU negative Renal ROS  negative genitourinary   Musculoskeletal negative musculoskeletal ROS (+)   Abdominal   Peds  Hematology negative hematology ROS (+)   Anesthesia Other Findings Normal echo 03/2018 Breast cancer  Reproductive/Obstetrics                            Anesthesia Physical Anesthesia Plan  ASA: II  Anesthesia Plan: General   Post-op Pain Management:    Induction: Intravenous  PONV Risk Score and Plan: 3 and Ondansetron, Dexamethasone, Midazolam and Treatment may vary due to age or medical condition  Airway Management Planned: Oral ETT  Additional Equipment: None  Intra-op Plan:   Post-operative Plan: Extubation in OR  Informed Consent: I have reviewed the patients History and Physical, chart, labs and discussed the procedure including the risks, benefits and alternatives for the proposed anesthesia with the patient or authorized representative who has indicated his/her understanding and acceptance.     Dental advisory given  Plan Discussed with:   Anesthesia Plan Comments:        Anesthesia Quick Evaluation

## 2019-01-13 NOTE — Interval H&P Note (Signed)
History and Physical Interval Note:  01/13/2019 11:06 AM  Samantha Mcneil Collyn Selk  has presented today for surgery, with the diagnosis of Acquired absence of both breasts; status post mastectomy.  The various methods of treatment have been discussed with the patient and family. After consideration of risks, benefits and other options for treatment, the patient has consented to  Procedure(s) with comments: REMOVAL OF BILATERAL TISSUE EXPANDERS WITH PLACEMENT OF BILATERAL BREAST IMPLANTS (Bilateral) REMOVAL PORT-A-CATH (N/A) - total case time is 2 hours as a surgical intervention.  The patient's history has been reviewed, patient examined, no change in status, stable for surgery.  I have reviewed the patient's chart and labs.  Questions were answered to the patient's satisfaction.     Loel Lofty Cleatus Gabriel

## 2019-01-13 NOTE — Discharge Instructions (Signed)
INSTRUCTIONS FOR AFTER SURGERY   You are having surgery.  You will likely have some questions about what to expect following your operation.  The following information will help you and your family understand what to expect when you are discharged from the hospital.  Following these guidelines will help ensure a smooth recovery and reduce risks of complications.   Postoperative instructions include information on: diet, wound care, medications and physical activity.  AFTER SURGERY Expect to go home after the procedure.  In some cases, you may need to spend one night in the hospital for observation.  DIET This surgery does not require a specific diet.  However, I have to mention that the healthier you eat the better your body can start healing. It is important to increasing your protein intake.  This means limiting the foods with sugar and carbohydrates.  Focus on vegetables and some meat.  If you have any liposuction during your procedure be sure to drink water.  If your urine is bright yellow, then it is concentrated, and you need to drink more water.  As a general rule after surgery, you should have 8 ounces of water every hour while awake.  If you find you are persistently nauseated or unable to take in liquids let us know.  NO TOBACCO USE or EXPOSURE.  This will slow your healing process and increase the risk of a wound.  WOUND CARE If you don't have a drain: If you have a drain: You can shower the day after surgery if you don't have a drain.  Use fragrance free soap.  Dial, [place] and [place] are usually mild on the skin. If you have a drain clean with baby wipes until the drain is removed.  If you have steri-strips / tape directly attached to your skin leave them in place. It is OK to get these wet.  No baths, pools or hot tubs for two weeks. We close your incision to leave the smallest and best-looking scar. No ointment or creams on your incisions until given the go ahead.  Especially not Neosporin  (Too many skin reactions with this one).  A few weeks after surgery you can use Mederma and start massaging the scar. We ask you to wear your binder or sports bra for the first 6 weeks around the clock, including while sleeping. This provides added comfort and helps reduce the fluid accumulation at the surgery site.  ACTIVITY No heavy lifting until cleared by the doctor.  It is OK to walk and climb stairs. In fact, moving your legs is very important to decrease your risk of a blood clot.  It will also help keep you from getting deconditioned.  Every 1 to 2 hours get up and walk for 5 minutes. This will help with a quicker recovery back to normal.  Let pain be your guide so you don't do too much.  NO, you cannot do the spring cleaning and don't plan on taking care of anyone else.  This is your time for TLC.  You will be more comfortable if you sleep and rest with your head elevated either with a few pillows under you or in a recliner.  No stomach sleeping for a few months.  WORK Everyone returns to work at different times. As a rough guide, most people take at least 1 - 2 weeks off prior to returning to work. If you need documentation for your job, bring the forms to your postoperative follow up visit.  DRIVING Arrange  for someone to bring you home from the hospital.  You may be able to drive a few days after surgery but not while taking any narcotics or valium.  BOWEL MOVEMENTS Constipation can occur after anesthesia and while taking pain medication.  It is important to stay ahead for your comfort.  We recommend taking Milk of Magnesia (2 tablespoons; twice a day) while taking the pain pills.  SEROMA This is fluid your body tried to put in the surgical site.  This is normal but if it creates tight skinny skin let us know.  It usually decreases in a few weeks.  WHEN TO CALL Call your surgeon's office if any of the following occur:  Fever 101 degrees F or greater  Excessive bleeding or fluid  from the incision site.  Pain that increases over time without aid from the medications  Redness, warmth, or pus draining from incision sites  Persistent nausea or inability to take in liquids  Severe misshapen area that underwent the operation.    Post Anesthesia Home Care Instructions  Activity: Get plenty of rest for the remainder of the day. A responsible individual must stay with you for 24 hours following the procedure.  For the next 24 hours, DO NOT: -Drive a car -Paediatric nurse -Drink alcoholic beverages -Take any medication unless instructed by your physician -Make any legal decisions or sign important papers.  Meals: Start with liquid foods such as gelatin or soup. Progress to regular foods as tolerated. Avoid greasy, spicy, heavy foods. If nausea and/or vomiting occur, drink only clear liquids until the nausea and/or vomiting subsides. Call your physician if vomiting continues.  Special Instructions/Symptoms: Your throat may feel dry or sore from the anesthesia or the breathing tube placed in your throat during surgery. If this causes discomfort, gargle with warm salt water. The discomfort should disappear within 24 hours.  If you had a scopolamine patch placed behind your ear for the management of post- operative nausea and/or vomiting:  1. The medication in the patch is effective for 72 hours, after which it should be removed.  Wrap patch in a tissue and discard in the trash. Wash hands thoroughly with soap and water. 2. You may remove the patch earlier than 72 hours if you experience unpleasant side effects which may include dry mouth, dizziness or visual disturbances. 3. Avoid touching the patch. Wash your hands with soap and water after contact with the patch.

## 2019-01-13 NOTE — Anesthesia Postprocedure Evaluation (Signed)
Anesthesia Post Note  Patient: Samantha Mcneil  Procedure(s) Performed: REMOVAL OF BILATERAL TISSUE EXPANDERS WITH PLACEMENT OF BILATERAL BREAST IMPLANTS (Bilateral Breast) REMOVAL PORT-A-CATH (N/A Breast)     Patient location during evaluation: PACU Anesthesia Type: General Level of consciousness: awake and alert Pain management: pain level controlled Vital Signs Assessment: post-procedure vital signs reviewed and stable Respiratory status: spontaneous breathing, nonlabored ventilation and respiratory function stable Cardiovascular status: blood pressure returned to baseline and stable Postop Assessment: no apparent nausea or vomiting Anesthetic complications: no    Last Vitals:  Vitals:   01/13/19 1500 01/13/19 1515  BP: 114/78 112/77  Pulse: 85 87  Resp: 12 13  Temp:    SpO2: 97% 100%    Last Pain:  Vitals:   01/13/19 1500  TempSrc:   PainSc: 3                  Lidia Collum

## 2019-01-13 NOTE — Anesthesia Procedure Notes (Signed)
Procedure Name: Intubation Date/Time: 01/13/2019 12:55 PM Performed by: Lyndee Leo, CRNA Pre-anesthesia Checklist: Patient identified, Emergency Drugs available, Suction available and Patient being monitored Patient Re-evaluated:Patient Re-evaluated prior to induction Oxygen Delivery Method: Circle system utilized Preoxygenation: Pre-oxygenation with 100% oxygen Induction Type: IV induction Ventilation: Mask ventilation without difficulty Laryngoscope Size: Mac and 3 Grade View: Grade I Tube type: Oral Tube size: 7.0 mm Number of attempts: 1 Airway Equipment and Method: Stylet and Oral airway Placement Confirmation: ETT inserted through vocal cords under direct vision,  positive ETCO2 and breath sounds checked- equal and bilateral Secured at: 22 cm Tube secured with: Tape Dental Injury: Teeth and Oropharynx as per pre-operative assessment

## 2019-01-14 ENCOUNTER — Encounter (HOSPITAL_BASED_OUTPATIENT_CLINIC_OR_DEPARTMENT_OTHER): Payer: Self-pay | Admitting: Plastic Surgery

## 2019-01-21 ENCOUNTER — Telehealth: Payer: Self-pay | Admitting: Plastic Surgery

## 2019-01-21 NOTE — Telephone Encounter (Signed)

## 2019-01-21 NOTE — Progress Notes (Signed)
   Subjective:     Patient ID: Samantha Mcneil, female    DOB: 1986-01-27, 33 y.o.   MRN: [id number]  Chief Complaint  Patient presents with  . Post-op Follow-up    for removal of (B) breast expanders and port-a-cart    HPI: The patient is a 33 y.o. female here for follow-up after removal of bilateral breast tissue expanders with placement of bilateral breast implants and removal of Port-A-Cath on 01/13/2019 with Dr. Marla Roe.  Samantha Mcneil had 425 cc smooth round high profile gel implants placed bilaterally.  The patient is doing very well.  Her incisions are healing nicely.  There is no sign of infection, seroma, hematoma.  The port incision on her left chest wall is healing nicely as well.  She reports that the implants feel very comfortable and are a lot more comfortable than the expanders.  She does have some left lateral rib pain bilaterally, she said it is approximately a 2 out of 10 and feels superficial.  It is not gotten worse or better.  She denies any fevers, chills, nausea, vomiting.   Review of Systems  Constitutional: Negative for chills, diaphoresis and fever.  HENT: Negative.   Respiratory: Negative.   Cardiovascular: Negative.   Musculoskeletal: Negative.   Skin: Negative for itching and rash.  Neurological: Negative.   Psychiatric/Behavioral: Negative.      Objective:   Vital Signs BP 111/72 (BP Location: Left Arm, Patient Position: Sitting, Cuff Size: Normal)   Pulse 75   Temp 97.8 F (36.6 C) (Temporal)   Ht 5\' 4"  (1.626 m)   Wt 158 lb 12.8 oz (72 kg)   SpO2 100%   BMI 27.26 kg/m  Vital Signs and Nursing Note Reviewed Chaperone present. Physical Exam  Constitutional: She is oriented to person, place, and time and well-developed, well-nourished, and in no distress. No distress.  HENT:  Head: Normocephalic and atraumatic.  Neck: Normal range of motion.  Cardiovascular: Normal rate.  Pulmonary/Chest: Effort normal.  Inframammary fold  incisions are healing nicely.  Steri-Strips in place with Dermabond.  Left chest wall port incision healing well.   Musculoskeletal: Normal range of motion.  Neurological: She is alert and oriented to person, place, and time. Gait normal.  Skin: Skin is warm and dry. No rash noted. She is not diaphoretic. No erythema. No pallor.  Psychiatric: Mood and affect normal.      Assessment/Plan:     ICD-10-CM   1. Malignant neoplasm of upper-outer quadrant of left breast in female, estrogen receptor negative ([place])  C50.412    Z17.1   2. S/P mastectomy, bilateral  Z90.13    Samantha Mcneil is doing very well.  All of her incision sites are healing well there are no signs of infection, hematoma, seroma.  She should avoid any heavy lifting.  She can work on her range of motion, but should avoid anything that causes her pain.  Continue to wear sports bra as long as it is not too tight.  Try Tylenol or ibuprofen for the lateral rib pain.  She denies any chest pain, arm pain, numbness.  Continue to eat healthy, drink plenty of water, take multivitamin and vitamin C  Follow-up in 1 month.  Call with any questions or concerns.  Return earlier if necessary.  Pictures were obtained of the patient and placed in the chart with the patient's or guardian's permission.  Samantha Rhine Maryem Shuffler, PA-C 01/22/2019, 11:48 AM

## 2019-01-22 ENCOUNTER — Ambulatory Visit (INDEPENDENT_AMBULATORY_CARE_PROVIDER_SITE_OTHER): Payer: BC Managed Care – PPO | Admitting: Surgical

## 2019-01-22 ENCOUNTER — Encounter: Payer: Self-pay | Admitting: Surgical

## 2019-01-22 ENCOUNTER — Other Ambulatory Visit: Payer: Self-pay

## 2019-01-22 VITALS — BP 111/72 | HR 75 | Temp 97.8°F | Ht 64.0 in | Wt 158.8 lb

## 2019-01-22 DIAGNOSIS — Z171 Estrogen receptor negative status [ER-]: Secondary | ICD-10-CM

## 2019-01-22 DIAGNOSIS — Z9013 Acquired absence of bilateral breasts and nipples: Secondary | ICD-10-CM

## 2019-01-22 DIAGNOSIS — C50412 Malignant neoplasm of upper-outer quadrant of left female breast: Secondary | ICD-10-CM

## 2019-01-29 ENCOUNTER — Telehealth: Payer: Self-pay | Admitting: Adult Health

## 2019-01-29 NOTE — Telephone Encounter (Signed)
Contacted patient to verify mychart video for pre reg

## 2019-02-01 ENCOUNTER — Encounter: Payer: Self-pay | Admitting: Adult Health

## 2019-02-01 ENCOUNTER — Inpatient Hospital Stay: Payer: BC Managed Care – PPO | Attending: Adult Health | Admitting: Adult Health

## 2019-02-01 DIAGNOSIS — Z1501 Genetic susceptibility to malignant neoplasm of breast: Secondary | ICD-10-CM | POA: Diagnosis not present

## 2019-02-01 DIAGNOSIS — C50412 Malignant neoplasm of upper-outer quadrant of left female breast: Secondary | ICD-10-CM | POA: Diagnosis not present

## 2019-02-01 DIAGNOSIS — Z1509 Genetic susceptibility to other malignant neoplasm: Secondary | ICD-10-CM | POA: Diagnosis not present

## 2019-02-01 DIAGNOSIS — Z171 Estrogen receptor negative status [ER-]: Secondary | ICD-10-CM | POA: Diagnosis not present

## 2019-02-01 NOTE — Progress Notes (Signed)
SURVIVORSHIP VIRTUAL VISIT:  I connected with Samantha Mcneil on 02/01/19 at  2:00 PM EDT by telephone and verified that I am speaking with the correct person using two identifiers.  I discussed the limitations, risks, security and privacy concerns of performing an evaluation and management service by telephone and the availability of in person appointments. I also discussed with the patient that there may be a patient responsible charge related to this service. The patient expressed understanding and agreed to proceed.   BRIEF ONCOLOGIC HISTORY:  Oncology History  Malignant neoplasm of upper-outer quadrant of left breast in female, estrogen receptor negative ([place])  03/12/2018 Initial Diagnosis   Palpable masses in both breasts with family history of breast cancer, breast density category D, ultrasound measured these hypoechoic irregular mass left breast 2.3 cm, right breast no abnormality, biopsy left breast mass: IDC grade 3 ER 0%, PR 0%, HER-2 negative, Ki-67 90%, lymph node biopsy benign: T2N0   03/18/2018 Cancer Staging   Staging form: Breast, AJCC 8th Edition - Clinical: Stage IIB (cT2, cN0, cM0, G3, ER-, PR-, HER2-) - Signed by Nicholas Lose, MD on 03/18/2018   03/30/2018 Breast MRI   Single biopsy-proven malignancy in the left 12 o'clock breast measures 2.3 x 1.3 x 1.5 cm.  Right breast without any malignancy detected.   04/02/2018 - 08/20/2018 Neo-Adjuvant Chemotherapy   Neoadjuvant chemotherapy with dose dense Adriamycin and Cytoxan followed by Taxol and carboplatin   04/23/2018 Genetic Testing   Positive genetic testing: A pathogenic variant was identified in BRCA2 called [id number] (p.Ala2951Glyfs*26) on the Common Hereditary Cancers Panel. The Common Hereditary Cancers Panel offered by [facility] includes sequencing and/or deletion duplication testing of the following 47 genes: APC, ATM, AXIN2, BARD1, BMPR1A, BRCA1, BRCA2, BRIP1, CDH1, CDKN2A (p14ARF), CDKN2A (p16INK4a), CKD4, CHEK2,  CTNNA1, DICER1, EPCAM (Deletion/duplication testing only), GREM1 (promoter region deletion/duplication testing only), KIT, MEN1, MLH1, MSH2, MSH3, MSH6, MUTYH, NBN, NF1, NHTL1, PALB2, PDGFRA, PMS2, POLD1, POLE, PTEN, RAD50, RAD51C, RAD51D, SDHB, SDHC, SDHD, SMAD4, SMARCA4. STK11, TP53, TSC1, TSC2, and VHL.  The following genes were evaluated for sequence changes only: SDHA and HOXB13 c.251G>A variant only. Genetic testing did detect a Variant of Unknown Significance (VUS) in the PALB2 gene called c.205C>T.  At this time, it is unknown if this variant is associated with increased cancer risk or if this is a normal finding, but most variants such as this get reclassified to being inconsequential. It should not be used to make medical management decisions.   The report date is 04/23/2018.    10/13/2018 Surgery   Bilateral mastectomies: right, benign; left, complete therapeutic response, no residual carcinoma, 3 SLN negative   10/15/2018 Cancer Staging   Staging form: Breast, AJCC 8th Edition - Pathologic: No Stage Recommended (ypT0, pN0, cM0) - Signed by [name], NP on 10/15/2018     INTERVAL HISTORY:  Samantha Mcneil to review her survivorship care plan detailing her treatment course for breast cancer, as well as monitoring long-term side effects of that treatment, education regarding health maintenance, screening, and overall wellness and health promotion.     Overall, Samantha Mcneil reports feeling quite well.  She says that her reconstruction is healing well.  She is happy with her cosmetic outcome.  She notes she has intermittent pain behind her breast.  She is seeing her plastic surgeon and has followed up with her about this.    REVIEW OF SYSTEMS:  Review of Systems  Constitutional: Negative for appetite change, chills, fatigue, fever and unexpected weight  change.  HENT:   Negative for hearing loss, sore throat and trouble swallowing.   Eyes: Negative for eye problems and icterus.   Respiratory: Negative for chest tightness, cough and shortness of breath.   Cardiovascular: Negative for chest pain, leg swelling and palpitations.  Gastrointestinal: Negative for abdominal distention, abdominal pain, constipation, diarrhea, nausea and vomiting.  Endocrine: Negative for hot flashes.  Skin: Negative for itching and rash.  Neurological: Negative for dizziness, extremity weakness and headaches.  Hematological: Negative for adenopathy. Bruises/bleeds easily (though improving over the past two weeks.).  Psychiatric/Behavioral: Negative for depression. The patient is not nervous/anxious.    Breast: Denies any new nodularity, masses, tenderness, nipple changes, or nipple discharge.      ONCOLOGY TREATMENT TEAM:  1. Surgeon:  Dr. Barry Dienes at [facility] 2. Medical Oncologist: Dr. Lindi Adie  3. Plastic Surgeon: Dr. Marla Roe    PAST MEDICAL/SURGICAL HISTORY:  Past Medical History:  Diagnosis Date  . Anxiety   . Depression   . Family history of breast cancer   . Family history of liver cancer   . Family history of lung cancer   . Headache    migraines  . History of kidney stones    Past Surgical History:  Procedure Laterality Date  . BREAST RECONSTRUCTION WITH PLACEMENT OF TISSUE EXPANDER AND FLEX HD (ACELLULAR HYDRATED DERMIS) Bilateral 10/13/2018   Procedure: BILATERAL IMMEDIATE BREAST RECONSTRUCTION WITH PLACEMENT OF TISSUE EXPANDER AND FLEX HD (ACELLULAR HYDRATED DERMIS);  Surgeon: Wallace Going, DO;  Location: [facility];  Service: Plastics;  Laterality: Bilateral;  . CESAREAN SECTION    . MASTECTOMY W/ SENTINEL NODE BIOPSY Bilateral 10/13/2018   Procedure: BILATERAL SKIN SPARING MASTECTOMIES WITH LEFT SENTINEL  LYMPH NODE BIOPSY.;  Surgeon: [name], MD;  Location: [facility];  Service: General;  Laterality: Bilateral;  . OTHER SURGICAL HISTORY    . PORT-A-CATH REMOVAL N/A 01/13/2019   Procedure: REMOVAL  PORT-A-CATH;  Surgeon: Wallace Going, DO;  Location: [facility];  Service: Plastics;  Laterality: N/A;  total case time is 2 hours  . PORTACATH PLACEMENT Left 04/01/2018   Procedure: INSERTION PORT-A-CATH;  Surgeon: [name], MD;  Location: [facility];  Service: General;  Laterality: Left;  . REMOVAL OF BILATERAL TISSUE EXPANDERS WITH PLACEMENT OF BILATERAL BREAST IMPLANTS Bilateral 01/13/2019   Procedure: REMOVAL OF BILATERAL TISSUE EXPANDERS WITH PLACEMENT OF BILATERAL BREAST IMPLANTS;  Surgeon: Wallace Going, DO;  Location: [facility];  Service: Plastics;  Laterality: Bilateral;     ALLERGIES:  No Known Allergies   CURRENT MEDICATIONS:  Outpatient Encounter Medications as of 02/01/2019  Medication Sig  . diazepam (VALIUM) 2 MG tablet TAKE 1 TABLET (2 MG TOTAL) BY MOUTH EVERY 6 HOURS AS NEEDED FOR ANXIETY.  Marland Kitchen HYDROcodone-acetaminophen (NORCO/VICODIN) 5-325 MG tablet Take 1 tablet by mouth every 6 (six) hours as needed for moderate pain.  Marland Kitchen ibuprofen (ADVIL,MOTRIN) 200 MG tablet Take 200 mg by mouth every 6 (six) hours as needed.  . venlafaxine XR (EFFEXOR-XR) 75 MG 24 hr capsule Take 1 capsule (75 mg total) by mouth daily with breakfast.  . [DISCONTINUED] prochlorperazine (COMPAZINE) 10 MG tablet Take 1 tablet (10 mg total) by mouth every 6 (six) hours as needed (Nausea or vomiting).   No facility-administered encounter medications on file as of 02/01/2019.      ONCOLOGIC FAMILY HISTORY:  Family History  Problem Relation Age of Onset  . Heart disease Father  d. 82  . Breast cancer Maternal Aunt        d. 52  . Breast cancer Cousin        dx 75  . Liver cancer Maternal Grandmother        d. 39  . Lung cancer Maternal Grandfather        d. 97  . Breast cancer Cousin        dx 79s     GENETIC COUNSELING/TESTING: Positive, BRCA2 mutation  SOCIAL HISTORY:  Social History   Socioeconomic History  . Marital  status: Married    Spouse name: Not on file  . Number of children: Not on file  . Years of education: Not on file  . Highest education level: Not on file  Occupational History  . Not on file  Social Needs  . Financial resource strain: Not on file  . Food insecurity    Worry: Not on file    Inability: Not on file  . Transportation needs    Medical: Not on file    Non-medical: Not on file  Tobacco Use  . Smoking status: Never Smoker  . Smokeless tobacco: Never Used  Substance and Sexual Activity  . Alcohol use: Yes    Comment: Rare  . Drug use: Never  . Sexual activity: Yes    Birth control/protection: None    Comment: 1st intercourse 33 yo-Fewer than 5 partners  Lifestyle  . Physical activity    Days per week: Not on file    Minutes per session: Not on file  . Stress: Not on file  Relationships  . Social Herbalist on phone: Not on file    Gets together: Not on file    Attends religious service: Not on file    Active member of club or organization: Not on file    Attends meetings of clubs or organizations: Not on file    Relationship status: Not on file  . Intimate partner violence    Fear of current or ex partner: Not on file    Emotionally abused: Not on file    Physically abused: Not on file    Forced sexual activity: Not on file  Other Topics Concern  . Not on file  Social History Narrative  . Not on file     OBSERVATIONS/OBJECTIVE:  Patient sounds well today.  She is in no apparent distress.  Mood and behavior are normal.  Breathing is non labored.    LABORATORY DATA:  None for this visit.  DIAGNOSTIC IMAGING:  None for this visit.     ASSESSMENT AND PLAN:  Ms.. Mcneil is a pleasant 33 y.o. female with Stage IIB left breast invasive ductal carcinoma, ER-/PR-/HER2-, diagnosed in 03/2018, treated with neo-adjuvant chemotherapy and bilateral mastectomies.  She presents to the Survivorship Clinic for our initial meeting and routine follow-up  post-completion of treatment for breast cancer.    1. Stage IIB left breast cancer:  Samantha Mcneil is continuing to recover from definitive treatment for breast cancer. She will follow-up with her medical oncologist, Dr. Lindi Adie in 04/2019 with history and physical exam per surveillance protocol.   Today, a comprehensive survivorship care plan and treatment summary was reviewed with the patient today detailing her breast cancer diagnosis, treatment course, potential late/long-term effects of treatment, appropriate follow-up care with recommendations for the future, and patient education resources.  A copy of this summary, along with a letter will be sent to the patient's primary care  provider via mail/fax/In Basket message after today's visit.    2. BRCA 2 positivity: I placed a referral to gyn-oncology for evaluation and discussion on risk mitigation, and surveillance.  RRSO recommended in the future around age 81.    3. Bruising: Patient had some easy bruising, however it is improving. We will get labs prior to her next appointment with [place].    4. Cancer screening:  Due to Samantha Mcneil's history and her age, she should receive screening for skin cancers, and gynecologic cancers.  The information and recommendations are listed on the patient's comprehensive care plan/treatment summary and were reviewed in detail with the patient.    5. Health maintenance and wellness promotion: Samantha Mcneil was encouraged to consume 5-7 servings of fruits and vegetables per day. We reviewed the "Nutrition Rainbow" handout, as well as the handout "Take Control of Your Health and Reduce Your Cancer Risk" from the [place].  She was also encouraged to engage in moderate to vigorous exercise for 30 minutes per day most days of the week. We discussed the [employer] fitness program, which is designed for cancer survivors to help them become more physically fit after cancer treatments.  She was instructed to  limit her alcohol consumption and continue to abstain from tobacco use.     6. Support services/counseling: It is not uncommon for this period of the patient's cancer care trajectory to be one of many emotions and stressors.  We discussed how this can be increasingly difficult during the times of quarantine and social distancing due to the COVID-19 pandemic.   She was given information regarding our available services and encouraged to contact me with any questions or for help enrolling in any of our support group/programs.    Follow up instructions:    -Return to cancer center in 04/2019 for f/u with Dr. Lindi Adie -She is welcome to return back to the Survivorship Clinic at any time; no additional follow-up needed at this time.  -Consider referral back to survivorship as a long-term survivor for continued surveillance  The patient was provided an opportunity to ask questions and all were answered. The patient agreed with the plan and demonstrated an understanding of the instructions.   The patient was advised to call back or seek an in-person evaluation if the symptoms worsen or if the condition fails to improve as anticipated.   I provided 22 minutes of non face-to-face telephone visit time during this encounter, and > 50% was spent counseling as documented under my assessment & plan.  Scot Dock, NP

## 2019-02-22 ENCOUNTER — Telehealth: Payer: Self-pay

## 2019-02-22 NOTE — Telephone Encounter (Signed)

## 2019-02-23 ENCOUNTER — Encounter: Payer: Self-pay | Admitting: Surgical

## 2019-02-23 ENCOUNTER — Ambulatory Visit (INDEPENDENT_AMBULATORY_CARE_PROVIDER_SITE_OTHER): Payer: BC Managed Care – PPO | Admitting: Surgical

## 2019-02-23 ENCOUNTER — Other Ambulatory Visit: Payer: Self-pay

## 2019-02-23 VITALS — BP 113/78 | HR 88 | Temp 97.5°F | Ht 64.0 in | Wt 161.0 lb

## 2019-02-23 DIAGNOSIS — C50412 Malignant neoplasm of upper-outer quadrant of left female breast: Secondary | ICD-10-CM

## 2019-02-23 DIAGNOSIS — Z9013 Acquired absence of bilateral breasts and nipples: Secondary | ICD-10-CM

## 2019-02-23 DIAGNOSIS — Z171 Estrogen receptor negative status [ER-]: Secondary | ICD-10-CM

## 2019-02-23 NOTE — Progress Notes (Signed)
   Subjective:     Patient ID: Samantha Mcneil, female    DOB: 01-01-1986, 33 y.o.   MRN: [id number]  Chief Complaint  Patient presents with  . Follow-up    1 mos for removal of (B) breast expanders    HPI: The patient is a 33 y.o. female here for follow-up after placement of bilateral breast implants and removal of port-a-cath on 01/13/19.  She is doing great. Incisions are c/d/i. All healing very nicely. No sign of infection, seroma, hematoma. No erythema noted.  She notes the pain along her lateral breasts has resolved but has been noticing some pain just over the left breast superior pole. Stabbing in nature and resolves with APAP or over a few hours times without medication.  No fevers, chills, n/v.  Review of Systems  Constitutional: Negative for chills, diaphoresis, fever and weight loss.  HENT: Negative.   Respiratory: Negative.   Cardiovascular: Negative.   Genitourinary: Negative.   Musculoskeletal: Negative for back pain and neck pain.       + left breast pain   Skin: Negative for itching and rash.  Neurological: Positive for sensory change.     Objective:   Vital Signs BP 113/78 (BP Location: Left Arm, Patient Position: Sitting, Cuff Size: Normal)   Pulse 88   Temp (!) 97.5 F (36.4 C) (Temporal)   Ht 5\' 4"  (1.626 m)   Wt 161 lb (73 kg)   SpO2 98%   BMI 27.64 kg/m  Vital Signs and Nursing Note Reviewed  Physical Exam  Constitutional: She is oriented to person, place, and time and well-developed, well-nourished, and in no distress. No distress.  HENT:  Head: Normocephalic and atraumatic.  Cardiovascular: Normal rate.  Pulmonary/Chest: Effort normal.    Musculoskeletal: Normal range of motion.  Neurological: She is alert and oriented to person, place, and time. Gait normal.  Skin: Skin is warm and dry. No rash noted. She is not diaphoretic. No erythema. No pallor.  Psychiatric: Mood and affect normal.      Assessment/Plan:     ICD-10-CM    1. Malignant neoplasm of upper-outer quadrant of left breast in female, estrogen receptor negative ([place])  C50.412    Z17.1   2. S/P mastectomy, bilateral  Z90.13    Mrs. Samantha Mcneil is doing great. She is going to follow up with oncology in 2 months. She is very pleased with her progress.  Follow up in 6 months - 1 year. If she is doing great in 6 months, she can come back in 1 year and cancel 6 month appointment.   Samantha Rhine Samantha Nathanson, PA-C 02/23/2019, 12:51 PM

## 2019-03-11 ENCOUNTER — Telehealth: Payer: Self-pay

## 2019-03-11 NOTE — Telephone Encounter (Signed)
Patient called with concerns about her breasts after reconstruction. Patient states that her breasts feel hard and sore. Patient offered appointment for tomorrow and answered the following questions: 1. To the best of your knowledge, have you been in close contact with any one with a confirmed diagnosis of COVID-19? No 2. Have you had any one or more of the following; fever, chills, cough, shortness of breath, or any flu-like symptoms? No 3. Have you been diagnosed with or have a previous diagnosis of COVID 19? No 4. I am going to go over a few other symptoms with you. Please let me know if you are experiencing any of the following: None of the below a. Ear, nose, or throat discomfort b. A sore throat c. Headache d. Muscle pain e. Diarrhea f. Loss of taste or smell

## 2019-03-12 ENCOUNTER — Encounter: Payer: Self-pay | Admitting: Plastic Surgery

## 2019-03-12 ENCOUNTER — Ambulatory Visit (INDEPENDENT_AMBULATORY_CARE_PROVIDER_SITE_OTHER): Payer: BC Managed Care – PPO | Admitting: Plastic Surgery

## 2019-03-12 ENCOUNTER — Other Ambulatory Visit: Payer: Self-pay

## 2019-03-12 VITALS — BP 118/80 | HR 80 | Temp 97.9°F | Ht 64.0 in | Wt 162.0 lb

## 2019-03-12 DIAGNOSIS — Z171 Estrogen receptor negative status [ER-]: Secondary | ICD-10-CM

## 2019-03-12 DIAGNOSIS — Z803 Family history of malignant neoplasm of breast: Secondary | ICD-10-CM

## 2019-03-12 DIAGNOSIS — C50412 Malignant neoplasm of upper-outer quadrant of left female breast: Secondary | ICD-10-CM

## 2019-03-12 DIAGNOSIS — Z9013 Acquired absence of bilateral breasts and nipples: Secondary | ICD-10-CM

## 2019-03-12 MED ORDER — DIAZEPAM 2 MG PO TABS: 2.0000 mg | ORAL_TABLET | Freq: Two times a day (BID) | ORAL | 0 refills | Status: AC | PRN

## 2019-03-12 MED FILL — diazePAM 2 MG TABS: 2 | 5 days supply | Qty: 10 | Fill #0

## 2019-03-12 NOTE — Progress Notes (Signed)
   Subjective:    Patient ID: Samantha Mcneil, female    DOB: 11/04/1985, 33 y.o.   MRN: [id number]  The patient is a 33 year old female here for follow-up on her breast reconstruction.  She underwent bilateral mastectomies with reconstruction with expanders.  She has already had her implants placed.  She has pretty good symmetry.  There is a little bit of swelling on the left side laterally as well as the left arm.  She says is a little bit tender as well.  This is concerning first the start of lymphedema.  Incisions are healing very well.  The breast is soft.  There is a little bit of firmness on the left medial breast.  This is most likely the capsule.  I do not see anything of concern.     Review of Systems  Constitutional: Negative.   HENT: Negative.   Eyes: Negative.   Respiratory: Negative.   Cardiovascular: Negative.   Gastrointestinal: Negative.   Endocrine: Negative.   Genitourinary: Negative.   Musculoskeletal: Negative.   Hematological: Negative.   Psychiatric/Behavioral: Negative.        Objective:   Physical Exam Vitals signs and nursing note reviewed.  Constitutional:      Appearance: Normal appearance.  Cardiovascular:     Rate and Rhythm: Normal rate.     Pulses: Normal pulses.  Pulmonary:     Effort: Pulmonary effort is normal.  Abdominal:     General: Abdomen is flat.  Neurological:     General: No focal deficit present.     Mental Status: She is alert and oriented to person, place, and time.  Psychiatric:        Mood and Affect: Mood normal.        Behavior: Behavior normal.         Assessment & Plan:     ICD-10-CM   1. S/P mastectomy, bilateral  Z90.13   2. Malignant neoplasm of upper-outer quadrant of left breast in female, estrogen receptor negative ([place])  C50.412    Z17.1   3. Family history of breast cancer  Z80.3     Recommend physical therapy for left arm edema.  Consult placed.  Follow-up in 2 to 3 months.  Patient ordered a  compression sleeve on line while in the office.

## 2019-03-12 NOTE — Addendum Note (Signed)
Addended by: Wallace Going on: 03/12/2019 12:10 PM   Modules accepted: Orders

## 2019-03-17 ENCOUNTER — Inpatient Hospital Stay: Payer: BC Managed Care – PPO | Attending: Adult Health | Admitting: Gynecologic Oncology

## 2019-03-17 ENCOUNTER — Other Ambulatory Visit: Payer: Self-pay

## 2019-03-17 ENCOUNTER — Inpatient Hospital Stay: Payer: BC Managed Care – PPO

## 2019-03-17 ENCOUNTER — Encounter: Payer: Self-pay | Admitting: Gynecologic Oncology

## 2019-03-17 VITALS — BP 120/56 | HR 81 | Temp 98.5°F | Resp 18 | Ht 64.0 in | Wt 163.2 lb

## 2019-03-17 DIAGNOSIS — Z1501 Genetic susceptibility to malignant neoplasm of breast: Secondary | ICD-10-CM

## 2019-03-17 DIAGNOSIS — Z9013 Acquired absence of bilateral breasts and nipples: Secondary | ICD-10-CM | POA: Diagnosis not present

## 2019-03-17 DIAGNOSIS — Z148 Genetic carrier of other disease: Secondary | ICD-10-CM | POA: Diagnosis not present

## 2019-03-17 DIAGNOSIS — Z1502 Genetic susceptibility to malignant neoplasm of ovary: Secondary | ICD-10-CM | POA: Diagnosis not present

## 2019-03-17 DIAGNOSIS — Z171 Estrogen receptor negative status [ER-]: Secondary | ICD-10-CM | POA: Diagnosis not present

## 2019-03-17 DIAGNOSIS — F419 Anxiety disorder, unspecified: Secondary | ICD-10-CM | POA: Insufficient documentation

## 2019-03-17 DIAGNOSIS — Z853 Personal history of malignant neoplasm of breast: Secondary | ICD-10-CM | POA: Diagnosis not present

## 2019-03-17 DIAGNOSIS — F329 Major depressive disorder, single episode, unspecified: Secondary | ICD-10-CM | POA: Insufficient documentation

## 2019-03-17 DIAGNOSIS — Z79899 Other long term (current) drug therapy: Secondary | ICD-10-CM | POA: Diagnosis not present

## 2019-03-17 DIAGNOSIS — Z1509 Genetic susceptibility to other malignant neoplasm: Secondary | ICD-10-CM

## 2019-03-17 MED ORDER — NORGESTIMATE-ETH ESTRADIOL 0.25-35 MG-MCG PO TABS: 1.0000 | ORAL_TABLET | Freq: Every day | ORAL | 11 refills | Status: DC

## 2019-03-17 MED FILL — NORGESTIMATE-ETH ESTRADIOL: 0.25-35 | 28 days supply | Qty: 28 | Fill #0

## 2019-03-17 NOTE — Patient Instructions (Signed)
Dr Denman George is recommending ultrasound every 6 months and CA 125 evaluations (tumor marker) every 6 months to monitor for ovarian cancer. When you are closer to age 33 or strongly desire to have the ovaries removed, we can perform a procedure to remove the tubes and ovaries.  In the meantime, taking birth control pills will decrease your risk for developing ovarian cancer and will prevent pregnancy. These have been phoned in to the [facility].  Dr Denman George recommends that you follow-up with Dr Phineas Real in 6 months for your wellness visit with a CA 125 and ultrasound at that visit.  Please contact Dr Serita Grit office (at [phone]) in July, 2021 to request an appointment with her for September, 2021 with another repeat US and CA 125.

## 2019-03-17 NOTE — Progress Notes (Signed)
Consult Note: Gyn-Onc  Consult was requested by Dr. Lindi Adie for Samantha evaluation of Samantha Mcneil 33 y.o. female  CC:  Chief Complaint  Mcneil presents with  . brca gene mutation positive in female    Assessment/Plan:  Samantha Mcneil  is a 33 y.o.  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  We discussed Samantha nature of BRCA2 germline mutations and how they can induce malignancy.  I discussed Samantha lifetime risk for ovarian cancer is 20 to 30% in patients with a BRCA2 germline mutation.  Typically Samantha average age of onset for these malignancies is in patients 6 or 7 decades of life therefore recommendation is for risk reducing surgery to be considered at or after age 92.  Prior to that age I recommendation would be to continue surveillance with 6 monthly ultrasound scans and Ca1 25 evaluations.  I explained Samantha limitations in Samantha screening test in their poor ability to detect cancers at an early stage however these are Samantha best studies available and oophorectomy at her age would induce an early surgical menopause that does not appear medically necessary at her time.  She is interested in delaying risk reducing surgery until closer to age 55.  She will see Dr. Phineas Real in 6 months time for her routine gynecologic evaluation we will obtain a Ca1 25 and ultrasound at that time.  I have ordered Ca1 25 and pelvic ultrasound now.  I will see her back in 1 year for continued observation surveillance.  In Samantha meantime I recommended chemoprophylaxis with oral contraceptive pills.  This will also accomplish her desire to avoid future childbearing.  I explained risks of oral contraceptive pills including increased VTE risk, migraine risk, and irregular vaginal bleeding.  She will notify either me or Dr. Phineas Real if she poorly tolerates this  medical regimen.   HPI: Samantha Mcneil is a 33 year old P2 who is seen in consultation at Samantha request of Dr Lindi Adie for a deleterious mutation in [place].   Samantha Mcneil has a personal history of ER PR negative HER-2/neu negative breast cancer diagnosed in September 2019.  This was in Samantha left breast and was stage IIb.  This was treated with neoadjuvant chemotherapy followed by bilateral mastectomy and left sentinel lymph node biopsy.  As part of her work-up she received genetic testing which revealed a deleterious mutation in BRCA2 with also a variant of unknown significance in Samantha PAL B2 gene.  Since completing her therapy in April 2020 she is remained free of disease.  Samantha Mcneil's medical history is otherwise significant for having had 2 prior cesarean section deliveries.  She has anxiety and depression.  Her family history is significant for breast cancer in aunts and cousins.  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  This establishment is in [place].  She reported that she is completed childbearing but has concerns about early menopause. Her mother was tested and found to be positive for Samantha same gene and underwent a risk reducing BSO.  Current Meds:  Outpatient Encounter Medications as of 03/17/2019  Medication Sig  . diazepam (VALIUM) 2 MG tablet Take 1 tablet (2 mg total) by mouth every 12 (twelve) hours as needed for up to 5 days for anxiety.  . norgestimate-ethinyl estradiol (ORTHO-CYCLEN, 28,) 0.25-35 MG-MCG tablet Take  1 tablet by mouth daily.  Marland Kitchen venlafaxine XR (EFFEXOR-XR) 75 MG 24 hr capsule Take 1 capsule (75 mg total) by mouth daily with breakfast.  . [DISCONTINUED] prochlorperazine (COMPAZINE) 10 MG tablet Take 1 tablet (10 mg total) by mouth every 6 (six) hours as needed (Nausea or vomiting).   No facility-administered encounter medications on file as of 03/17/2019.     Allergy: No Known Allergies  Social Hx:   Social  History   Socioeconomic History  . Marital status: Married    Spouse name: Not on file  . Number of children: Not on file  . Years of education: Not on file  . Highest education level: Not on file  Occupational History  . Not on file  Social Needs  . Financial resource strain: Not on file  . Food insecurity    Worry: Not on file    Inability: Not on file  . Transportation needs    Medical: Not on file    Non-medical: Not on file  Tobacco Use  . Smoking status: Never Smoker  . Smokeless tobacco: Never Used  Substance and Sexual Activity  . Alcohol use: Yes    Comment: Rare  . Drug use: Never  . Sexual activity: Yes    Birth control/protection: None    Comment: 1st intercourse 33 yo-Fewer than 5 partners  Lifestyle  . Physical activity    Days per week: Not on file    Minutes per session: Not on file  . Stress: Not on file  Relationships  . Social Herbalist on phone: Not on file    Gets together: Not on file    Attends religious service: Not on file    Active member of club or organization: Not on file    Attends meetings of clubs or organizations: Not on file    Relationship status: Not on file  . Intimate partner violence    Fear of current or ex partner: Not on file    Emotionally abused: Not on file    Physically abused: Not on file    Forced sexual activity: Not on file  Other Topics Concern  . Not on file  Social History Narrative  . Not on file    Past Surgical Hx:  Past Surgical History:  Procedure Laterality Date  . BREAST RECONSTRUCTION WITH PLACEMENT OF TISSUE EXPANDER AND FLEX HD (ACELLULAR HYDRATED DERMIS) Bilateral 10/13/2018   Procedure: BILATERAL IMMEDIATE BREAST RECONSTRUCTION WITH PLACEMENT OF TISSUE EXPANDER AND FLEX HD (ACELLULAR HYDRATED DERMIS);  Surgeon: Wallace Going, DO;  Location: [facility];  Service: Plastics;  Laterality: Bilateral;  . CESAREAN SECTION    . MASTECTOMY W/ SENTINEL NODE BIOPSY Bilateral  10/13/2018   Procedure: BILATERAL SKIN SPARING MASTECTOMIES WITH LEFT SENTINEL  LYMPH NODE BIOPSY.;  Surgeon: [name], MD;  Location: [facility];  Service: General;  Laterality: Bilateral;  . OTHER SURGICAL HISTORY    . PORT-A-CATH REMOVAL N/A 01/13/2019   Procedure: REMOVAL PORT-A-CATH;  Surgeon: Wallace Going, DO;  Location: [facility];  Service: Plastics;  Laterality: N/A;  total case time is 2 hours  . PORTACATH PLACEMENT Left 04/01/2018   Procedure: INSERTION PORT-A-CATH;  Surgeon: [name], MD;  Location: [facility];  Service: General;  Laterality: Left;  . REMOVAL OF BILATERAL TISSUE EXPANDERS WITH PLACEMENT OF BILATERAL BREAST IMPLANTS Bilateral 01/13/2019   Procedure: REMOVAL OF BILATERAL TISSUE EXPANDERS WITH PLACEMENT OF BILATERAL BREAST IMPLANTS;  Surgeon: Wallace Going, DO;  Location: [facility];  Service: Plastics;  Laterality: Bilateral;    Past Medical Hx:  Past Medical History:  Diagnosis Date  . Anxiety   . Depression   . Family history of breast cancer   . Family history of liver cancer   . Family history of lung cancer   . Headache    migraines  . History of kidney stones     Past Gynecological History:  See HPI No LMP recorded. (Menstrual status: Chemotherapy).  Family Hx:  Family History  Problem Relation Age of Onset  . Heart disease Father        d. 47  . Breast cancer Maternal Aunt        d. 50  . Breast cancer Cousin        dx 42  . Liver cancer Maternal Grandmother        d. 51  . Lung cancer Maternal Grandfather        d. 31  . Breast cancer Cousin        dx 60s    Review of Systems:  Constitutional  Feels well,   ENT Normal appearing ears and nares bilaterally Skin/Breast  No rash, sores, jaundice, itching, dryness Cardiovascular  No chest pain, shortness of breath, or edema  Pulmonary  No cough or wheeze.  Gastro Intestinal  No nausea, vomitting, or  diarrhoea. No bright red blood per rectum, no abdominal pain, change in bowel movement, or constipation.  Genito Urinary  No frequency, urgency, dysuria, no abnormal bleeding, she has resumed menses since completing chemotherapy. Musculo Skeletal  No myalgia, arthralgia, joint swelling or pain  Neurologic  No weakness, numbness, change in gait,  Psychology  No depression, anxiety, insomnia.   Vitals:  Blood pressure (!) 120/56, pulse 81, temperature 98.5 F (36.9 C), temperature source Oral, resp. rate 18, height '5\' 4"'  (1.626 m), weight 163 lb 3.2 oz (74 kg), SpO2 100 %.  Physical Exam: WD in NAD Neck  Supple NROM, without any enlargements.  Lymph Node Survey No cervical supraclavicular or inguinal adenopathy Cardiovascular  Pulse normal rate, regularity and rhythm. S1 and S2 normal.  Lungs  Clear to auscultation bilateraly, without wheezes/crackles/rhonchi. Good air movement.  Skin  No rash/lesions/breakdown  Psychiatry  Alert and oriented to person, place, and time  Abdomen  Normoactive bowel sounds, abdomen soft, non-tender and nonobese without evidence of hernia. Back No CVA tenderness Genito Urinary  Vulva/vagina: Normal external female genitalia.  No lesions. No discharge or bleeding.  Bladder/urethra:  No lesions or masses, well supported bladder  Vagina: normal  Cervix: Normal appearing, no lesions.  Uterus:  Small, mobile, no parametrial involvement or nodularity.  Adnexa: no palpable masses. Rectal  deferred Extremities  No bilateral cyanosis, clubbing or edema.   Thereasa Solo, MD  03/17/2019, 5:42 PM

## 2019-03-18 LAB — CA 125: Cancer Antigen (CA) 125: 64 U/mL — ABNORMAL HIGH (ref 0.0–38.1)

## 2019-03-22 ENCOUNTER — Encounter: Payer: Self-pay | Admitting: Gynecology

## 2019-03-23 ENCOUNTER — Ambulatory Visit (HOSPITAL_COMMUNITY): Admission: RE | Admit: 2019-03-23 | Payer: BC Managed Care – PPO | Source: Ambulatory Visit

## 2019-03-23 ENCOUNTER — Encounter: Payer: Self-pay | Admitting: Physical Therapy

## 2019-03-23 ENCOUNTER — Ambulatory Visit: Payer: BC Managed Care – PPO | Attending: Plastic Surgery | Admitting: Physical Therapy

## 2019-03-23 ENCOUNTER — Other Ambulatory Visit: Payer: Self-pay

## 2019-03-23 DIAGNOSIS — M25612 Stiffness of left shoulder, not elsewhere classified: Secondary | ICD-10-CM

## 2019-03-23 DIAGNOSIS — M25512 Pain in left shoulder: Secondary | ICD-10-CM | POA: Diagnosis present

## 2019-03-23 DIAGNOSIS — R293 Abnormal posture: Secondary | ICD-10-CM

## 2019-03-23 DIAGNOSIS — Z483 Aftercare following surgery for neoplasm: Secondary | ICD-10-CM | POA: Diagnosis present

## 2019-03-23 NOTE — Therapy (Signed)
[place], [place], [address] Phone: [phone]   Fax:  [phone]  Physical Therapy Evaluation  Patient Details  Name: Samantha Mcneil MRN: [id number] Date of Birth: Mar 01, 1986 Referring Provider (PT): Dr. Marla Roe   Encounter Date: 03/23/2019  PT End of Session - 03/23/19 0916    Visit Number  1    Number of Visits  4    Date for PT Re-Evaluation  04/20/19    Authorization Type  Medicaid    Authorization Time Period  sending initial request 03/23/19    Authorization - Visit Number  1    Authorization - Number of Visits  4    PT Start Time  0850   pt arrived late   PT Stop Time  0915    PT Time Calculation (min)  25 min    Activity Tolerance  Patient tolerated treatment well    Behavior During Therapy  [facility] for tasks assessed/performed       Past Medical History:  Diagnosis Date  . Anxiety   . Depression   . Family history of breast cancer   . Family history of liver cancer   . Family history of lung cancer   . Headache    migraines  . History of kidney stones     Past Surgical History:  Procedure Laterality Date  . BREAST RECONSTRUCTION WITH PLACEMENT OF TISSUE EXPANDER AND FLEX HD (ACELLULAR HYDRATED DERMIS) Bilateral 10/13/2018   Procedure: BILATERAL IMMEDIATE BREAST RECONSTRUCTION WITH PLACEMENT OF TISSUE EXPANDER AND FLEX HD (ACELLULAR HYDRATED DERMIS);  Surgeon: Wallace Going, DO;  Location: [facility];  Service: Plastics;  Laterality: Bilateral;  . CESAREAN SECTION    . MASTECTOMY W/ SENTINEL NODE BIOPSY Bilateral 10/13/2018   Procedure: BILATERAL SKIN SPARING MASTECTOMIES WITH LEFT SENTINEL  LYMPH NODE BIOPSY.;  Surgeon: [name], MD;  Location: [facility];  Service: General;  Laterality: Bilateral;  . OTHER SURGICAL HISTORY    . PORT-A-CATH REMOVAL N/A 01/13/2019   Procedure: REMOVAL PORT-A-CATH;  Surgeon: Wallace Going,  DO;  Location: [facility];  Service: Plastics;  Laterality: N/A;  total case time is 2 hours  . PORTACATH PLACEMENT Left 04/01/2018   Procedure: INSERTION PORT-A-CATH;  Surgeon: [name], MD;  Location: [facility];  Service: General;  Laterality: Left;  . REMOVAL OF BILATERAL TISSUE EXPANDERS WITH PLACEMENT OF BILATERAL BREAST IMPLANTS Bilateral 01/13/2019   Procedure: REMOVAL OF BILATERAL TISSUE EXPANDERS WITH PLACEMENT OF BILATERAL BREAST IMPLANTS;  Surgeon: Wallace Going, DO;  Location: [facility];  Service: Plastics;  Laterality: Bilateral;    There were no vitals filed for this visit.   Subjective Assessment - 03/23/19 0852    Subjective  I have some tightness when I lift my left arm. I was having some swelling but that is better now. I am having some pain under my left arm and in my upper arm.    Pertinent History  s/p bilateral mastectomy with L SLNB 10/13/18 for treatment of left breast cancer (triple negative stage II), BRCA 2 +, pt has completed chemo, expanders placed 10/13/18 and implants placed 01/13/19    Patient Stated Goals  to not have any more pain, I want to be able to do what I used to do because I now I can't do anything    Currently in Pain?  No/denies    Pain Score  3     Pain  Location  Other (Comment)   left upper quadrant   Pain Orientation  Left;Upper    Pain Descriptors / Indicators  Sore    Pain Type  Surgical pain    Pain Radiating Towards  in to upper arm when it gets bad    Pain Onset  1 to 4 weeks ago    Pain Frequency  Constant    Aggravating Factors   moving the arm    Pain Relieving Factors  staying still    Effect of Pain on Daily Activities  can't do anything at home, has trouble cooking and cleaning at home, trouble with laundry         [facility] PT Assessment - 03/23/19 0001      Assessment   Medical Diagnosis  left breast cancer    Referring Provider (PT)  Dr. Marla Roe    Onset Date/Surgical  Date  10/13/18    Hand Dominance  Right    Prior Therapy  none      Precautions   Precautions  Other (comment)    Precaution Comments  at risk of lymphedema      Restrictions   Weight Bearing Restrictions  No      Balance Screen   Has the patient fallen in the past 6 months  No    Has the patient had a decrease in activity level because of a fear of falling?   No    Is the patient reluctant to leave their home because of a fear of falling?   No      Home Environment   Living Environment  Private residence    Living Arrangements  Spouse/significant other;Children    Available Help at Discharge  Family    Type of [place]      Prior Function   Level of Independence  Needs assistance with homemaking   someitmes needs assist with driving   Vocation  Self employed    Vocation Requirements  Scientist, forensic    Leisure  pt does not currently exercise      Cognition   Overall Cognitive Status  Within Functional Limits for tasks assessed      ROM / Strength   AROM / PROM / Strength  AROM      AROM   AROM Assessment Site  Shoulder    Right/Left Shoulder  Right;Left    Right Shoulder Flexion  163 Degrees    Right Shoulder ABduction  175 Degrees    Right Shoulder Internal Rotation  29 Degrees    Right Shoulder External Rotation  90 Degrees    Left Shoulder Flexion  143 Degrees    Left Shoulder ABduction  140 Degrees   with pain in shoulder and axilla   Left Shoulder Internal Rotation  46 Degrees    Left Shoulder External Rotation  90 Degrees        LYMPHEDEMA/ONCOLOGY QUESTIONNAIRE - 03/23/19 0905      Type   Cancer Type  left breast cancer      Surgeries   Mastectomy Date  10/13/18    Saline Implant Reconstruction Date  01/13/19    Sentinel Lymph Node Biopsy Date  10/13/18    Number Lymph Nodes Removed  1      Treatment   Active Chemotherapy Treatment  No    Past Chemotherapy Treatment  Yes    Active Radiation Treatment  No    Past Radiation Treatment  No     Current Hormone Treatment  No    Past Hormone Therapy  No      What other symptoms do you have   Are you Having Heaviness or Tightness  Yes    Are you having Pain  Yes    Are you having pitting edema  No    Is it Hard or Difficult finding clothes that fit  No    Do you have infections  --    Is there Decreased scar mobility  --      Lymphedema Assessments   Lymphedema Assessments  Upper extremities      Right Upper Extremity Lymphedema   15 cm Proximal to Olecranon Process  35 cm    Olecranon Process  26 cm    15 cm Proximal to Ulnar Styloid Process  26 cm    Just Proximal to Ulnar Styloid Process  15.9 cm    Across Hand at [employer]  18.3 cm    At [place] of 2nd Digit  6.6 cm      Left Upper Extremity Lymphedema   15 cm Proximal to Olecranon Process  34.3 cm    Olecranon Process  26.6 cm    15 cm Proximal to Ulnar Styloid Process  26 cm    Just Proximal to Ulnar Styloid Process  15.5 cm    Across Hand at [employer]  17.2 cm    At [place] of 2nd Digit  5.5 cm          Quick Dash - 03/23/19 0001    Open a tight or new jar  Severe difficulty    Do heavy household chores (wash walls, wash floors)  Severe difficulty    Carry a shopping bag or briefcase  Mild difficulty    Wash your back  No difficulty    Use a knife to cut food  Mild difficulty    Recreational activities in which you take some force or impact through your arm, shoulder, or hand (golf, hammering, tennis)  Severe difficulty    During the past week, to what extent has your arm, shoulder or hand problem interfered with your normal social activities with family, friends, neighbors, or groups?  Modererately    During the past week, to what extent has your arm, shoulder or hand problem limited your work or other regular daily activities  Modererately    Arm, shoulder, or hand pain.  Severe    Tingling (pins and needles) in your arm, shoulder, or hand  Severe    Difficulty Sleeping  Moderate difficulty    DASH  Score  52.27 %        Objective measurements completed on examination: See above findings.              PT Education - 03/23/19 [phone]    Education Details  anatomy and physiology of lymphatic system, post op breast exercises    Person(s) Educated  Patient    Methods  Explanation    Comprehension  Verbalized understanding          PT Long Term Goals - 03/23/19 0921      PT LONG TERM GOAL #1   Title  Pt will demonstrate 165 degrees of left shoulder flexion to allow her to reach items overhead    Baseline  143    Time  4    Period  Weeks    Status  New    Target Date  04/20/19      PT LONG TERM  GOAL #2   Title  Pt will demonstrate 165 degrees of left shoulder abduction to allow her to reach out to the side    Baseline  140    Time  4    Period  Weeks    Status  New    Target Date  04/20/19      PT LONG TERM GOAL #3   Title  Pt will be independent in a home exercise program for continued strengthening and stretching    Time  4    Period  Weeks    Status  New    Target Date  04/20/19      PT LONG TERM GOAL #4   Title  Pt will report a 75% improvement in tightness in left upper quadrant to allow improved comfort    Time  4    Period  Weeks    Status  New    Target Date  04/20/19             Plan - 03/23/19 5697    Clinical Impression Statement  Pt presents to PT following a bilateral mastectomy for treatment of left triple negative breast cancer with L SLNB on 10/13/18. She had expanders placed and in July 2020 she had implants placed. She has completed chemotherapy and does not require radiation. She is having left upper quadrant pain and tightness since the expanders were placed. She reports her scars are healed. She demonstrates decreased left shoulder ROM. She would benefit from skilled PT services to improve left shoulder ROM, decrease left axillary and pec tightness to decrease pain and progress pt towards independence with a home exercise program.     Examination-Activity Limitations  Reach Overhead;Carry;Sleep    Examination-Participation Restrictions  Driving;Meal Prep;Yard Work;Community Activity;Cleaning;Laundry    Stability/Clinical Decision Making  Stable/Uncomplicated    Clinical Decision Making  Moderate    Rehab Potential  Excellent    PT Frequency  --   3 visits per Josem Kaufmann   PT Duration  4 weeks    PT Treatment/Interventions  ADLs/Self Care Home Management;Therapeutic exercise;Therapeutic activities;Patient/family education;Manual techniques;Scar mobilization;Passive range of motion;Taping    PT Next Visit Plan  issue lymphedema risk reduction practices, begin AAROM and PROM to left shoulder, give supine dowel exercises    PT Home Exercise Plan  post op breast    Consulted and Agree with Plan of Care  Patient       Patient will benefit from skilled therapeutic intervention in order to improve the following deficits and impairments:  Decreased knowledge of precautions, Decreased range of motion, Decreased strength, Impaired UE functional use, Postural dysfunction, Pain  Visit Diagnosis: Stiffness of left shoulder, not elsewhere classified  Acute pain of left shoulder  Aftercare following surgery for neoplasm  Abnormal posture     Problem List Patient Active Problem List   Diagnosis Date Noted  . S/P mastectomy, bilateral 10/29/2018  . Acquired absence of breast 10/20/2018  . Breast cancer ([place]) 10/13/2018  . BRCA2 gene mutation positive 04/28/2018  . Genetic testing 04/24/2018  . Family history of breast cancer   . Family history of lung cancer   . Family history of liver cancer   . Port-A-Cath in place 04/09/2018  . Malignant neoplasm of upper-outer quadrant of left breast in female, estrogen receptor negative ([place]) 03/18/2018    Allyson Sabal [facility] 03/23/2019, 9:26 AM  [place], [place], [address] Phone: [phone]   Fax:  [phone]  Name: Samantha Mcneil MRN: [id number] Date of Birth: Apr 25, 1986  Manus Gunning, PT 03/23/19 9:26 AM

## 2019-03-31 ENCOUNTER — Ambulatory Visit: Payer: BC Managed Care – PPO

## 2019-03-31 ENCOUNTER — Other Ambulatory Visit: Payer: Self-pay

## 2019-03-31 DIAGNOSIS — M25612 Stiffness of left shoulder, not elsewhere classified: Secondary | ICD-10-CM | POA: Diagnosis not present

## 2019-03-31 DIAGNOSIS — Z483 Aftercare following surgery for neoplasm: Secondary | ICD-10-CM

## 2019-03-31 DIAGNOSIS — R293 Abnormal posture: Secondary | ICD-10-CM

## 2019-03-31 DIAGNOSIS — M25512 Pain in left shoulder: Secondary | ICD-10-CM

## 2019-03-31 NOTE — Therapy (Signed)
[place], [place], [address] Phone: [phone]   Fax:  [phone]  Physical Therapy Treatment  Patient Details  Name: Samantha Mcneil MRN: [id number] Date of Birth: Nov 05, 1985 Referring Provider (PT): Dr. Marla Roe   Encounter Date: 03/31/2019  PT End of Session - 03/31/19 0854    Visit Number  2    Number of Visits  4    Date for PT Re-Evaluation  04/20/19    Authorization Type  Medicaid    Authorization Time Period  sending initial request 03/23/19; initial approval from 03/31/19-04/27/19 for 3 visits    Authorization - Visit Number  2    Authorization - Number of Visits  4    PT Start Time  0820   pt arrived late   PT Stop Time  0855    PT Time Calculation (min)  35 min    Activity Tolerance  Patient tolerated treatment well    Behavior During Therapy  [facility] for tasks assessed/performed       Past Medical History:  Diagnosis Date  . Anxiety   . Depression   . Family history of breast cancer   . Family history of liver cancer   . Family history of lung cancer   . Headache    migraines  . History of kidney stones     Past Surgical History:  Procedure Laterality Date  . BREAST RECONSTRUCTION WITH PLACEMENT OF TISSUE EXPANDER AND FLEX HD (ACELLULAR HYDRATED DERMIS) Bilateral 10/13/2018   Procedure: BILATERAL IMMEDIATE BREAST RECONSTRUCTION WITH PLACEMENT OF TISSUE EXPANDER AND FLEX HD (ACELLULAR HYDRATED DERMIS);  Surgeon: Wallace Going, DO;  Location: [place];  Service: Plastics;  Laterality: Bilateral;  . CESAREAN SECTION    . MASTECTOMY W/ SENTINEL NODE BIOPSY Bilateral 10/13/2018   Procedure: BILATERAL SKIN SPARING MASTECTOMIES WITH LEFT SENTINEL  LYMPH NODE BIOPSY.;  Surgeon: [name], MD;  Location: [place];  Service: General;  Laterality: Bilateral;  . OTHER SURGICAL HISTORY    . PORT-A-CATH REMOVAL N/A 01/13/2019   Procedure:  REMOVAL PORT-A-CATH;  Surgeon: Wallace Going, DO;  Location: [place];  Service: Plastics;  Laterality: N/A;  total case time is 2 hours  . PORTACATH PLACEMENT Left 04/01/2018   Procedure: INSERTION PORT-A-CATH;  Surgeon: [name], MD;  Location: [place];  Service: General;  Laterality: Left;  . REMOVAL OF BILATERAL TISSUE EXPANDERS WITH PLACEMENT OF BILATERAL BREAST IMPLANTS Bilateral 01/13/2019   Procedure: REMOVAL OF BILATERAL TISSUE EXPANDERS WITH PLACEMENT OF BILATERAL BREAST IMPLANTS;  Surgeon: Wallace Going, DO;  Location: [place];  Service: Plastics;  Laterality: Bilateral;    There were no vitals filed for this visit.  Subjective Assessment - 03/31/19 0821    Subjective  Sorry I'm running late, just ran into alot of traffic. Having alot of pressure in my Lt shoulder this morning.    Pertinent History  s/p bilateral mastectomy with L SLNB 10/13/18 for treatment of left breast cancer (triple negative stage II), BRCA 2 +, pt has completed chemo, expanders placed 10/13/18 and implants placed 01/13/19    Patient Stated Goals  to not have any more pain, I want to be able to do what I used to do because I now I can't do anything    Currently in Pain?  Yes    Pain Score  2     Pain Location  Shoulder    Pain Orientation  Left    Pain Descriptors / Indicators  Pressure    Pain Type  Surgical pain    Pain Radiating Towards  into upper arm    Pain Onset  1 to 4 weeks ago    Pain Frequency  Constant    Aggravating Factors   moving the arm    Pain Relieving Factors  staying still                       [facility] Adult PT Treatment/Exercise - 03/31/19 0001      Shoulder Exercises: Supine   Horizontal ABduction  AAROM;Left;5 reps   with dowel, 5 sec holds   Horizontal ABduction Limitations  this was very good stetch for pt so did not move into abduction    External Rotation  AAROM;Left;5 reps   with dowel, 5 sec  holds   External Rotation Limitations  also with fingers clasped behind head and abducting elbows but stretch felt so stopped    Flexion  AAROM;Both;5 reps   with dowel, 5 sec holds     Manual Therapy   Manual Therapy  Myofascial release;Passive ROM    Myofascial Release  In Supine to Lt axilla during P/ROM and UE pulling throughout P/ROM    Passive ROM  In Supine to pts tolerance into flexion, abduction and D2; pt with very limited tolerance due to tightness             PT Education - 03/31/19 1058    Education Details  Supine dowel exericses    Person(s) Educated  Patient    Methods  Explanation;Demonstration;Handout    Comprehension  Verbalized understanding;Returned demonstration;Need further instruction          PT Long Term Goals - 03/23/19 0921      PT LONG TERM GOAL #1   Title  Pt will demonstrate 165 degrees of left shoulder flexion to allow her to reach items overhead    Baseline  143    Time  4    Period  Weeks    Status  New    Target Date  04/20/19      PT LONG TERM GOAL #2   Title  Pt will demonstrate 165 degrees of left shoulder abduction to allow her to reach out to the side    Baseline  140    Time  4    Period  Weeks    Status  New    Target Date  04/20/19      PT LONG TERM GOAL #3   Title  Pt will be independent in a home exercise program for continued strengthening and stretching    Time  4    Period  Weeks    Status  New    Target Date  04/20/19      PT LONG TERM GOAL #4   Title  Pt will report a 75% improvement in tightness in left upper quadrant to allow improved comfort    Time  4    Period  Weeks    Status  New    Target Date  04/20/19            Plan - 03/31/19 0858    Clinical Impression Statement  Pt arrived 20 mins late for appt today. Was able to instruct her in supine dowel for HEP which she did well with. Then began manual therapy of myofascial release and P/ROM to Lt shoulder. She is very limited by  tightness and  then pain at end of motions. Educated in lymphedema risk reduction practices and infection prevention during manual therapy today. Also educated her inimportance of obtaining a compression sleeve has she had a ALND increasing her risk of lymphedema and pt is agreeable to this and having her demographics sent to [facility] medical. She is also agreeable to coming 04/12/19 to be measured by fitter Melissa at 1000.    Examination-Activity Limitations  Reach Overhead;Carry;Sleep    Examination-Participation Restrictions  Driving;Meal Prep;Yard Work;Community Activity;Cleaning;Laundry    Stability/Clinical Decision Making  Stable/Uncomplicated    Rehab Potential  Excellent    PT Frequency  Other (comment)   3 visits per Josem Kaufmann   PT Duration  4 weeks    PT Treatment/Interventions  ADLs/Self Care Home Management;Therapeutic exercise;Therapeutic activities;Patient/family education;Manual techniques;Scar mobilization;Passive range of motion;Taping    PT Next Visit Plan  Cont AAROM and PROM to left shoulder, review prn supine dowel exercises; try pulleys and ball roll up wall    PT Home Exercise Plan  post op breast    Recommended Other Services  Sent demographics to [facility] and script to Dr. Chalmers Guest and Agree with Plan of Care  Patient       Patient will benefit from skilled therapeutic intervention in order to improve the following deficits and impairments:  Decreased knowledge of precautions, Decreased range of motion, Decreased strength, Impaired UE functional use, Postural dysfunction, Pain  Visit Diagnosis: Stiffness of left shoulder, not elsewhere classified  Acute pain of left shoulder  Aftercare following surgery for neoplasm  Abnormal posture     Problem List Patient Active Problem List   Diagnosis Date Noted  . S/P mastectomy, bilateral 10/29/2018  . Acquired absence of breast 10/20/2018  . Breast cancer ([place]) 10/13/2018  . BRCA2 gene mutation positive 04/28/2018  .  Genetic testing 04/24/2018  . Family history of breast cancer   . Family history of lung cancer   . Family history of liver cancer   . Port-A-Cath in place 04/09/2018  . Malignant neoplasm of upper-outer quadrant of left breast in female, estrogen receptor negative ([place]) 03/18/2018    Otelia Limes, PTA 03/31/2019, 11:00 AM  [place], [place], [address] Phone: [phone]   Fax:  [phone]  Name: Samantha Mcneil MRN: [id number] Date of Birth: 22-Jun-1986

## 2019-03-31 NOTE — Patient Instructions (Addendum)
SHOULDER: Flexion - Supine (Cane)        Cancer Rehab [phone]    Hold cane in both hands. Raise arms up overhead. Do not allow back to arch. Hold _5__ seconds. Do __5-10__ times; __1-2__ times a day.   SELF ASSISTED WITH OBJECT: Shoulder Abduction / Adduction - Supine    Hold cane with both hands. Move both arms from side to side, keep elbows straight.  Hold when stretch felt for __5__ seconds. Repeat __5-10__ times; __1-2__ times a day. Once this becomes easier progress to third picture bringing affected arm towards ear by staying out to side. Same hold for _5_seconds. Repeat  _5-10_ times, _1-2_ times/day.   SHOULDER: External Rotation - Supine (Cane)    Hold cane with both hands. Rotate arm away from body. Keep elbow on floor and next to body. _5-10__ reps per set, hold 5 seconds, _1-2__ sets per day. Add towel to keep elbow at side.  Copyright  VHI. All rights reserved.       

## 2019-04-06 ENCOUNTER — Other Ambulatory Visit: Payer: Self-pay

## 2019-04-06 ENCOUNTER — Ambulatory Visit: Payer: BC Managed Care – PPO | Attending: Plastic Surgery

## 2019-04-06 DIAGNOSIS — M25612 Stiffness of left shoulder, not elsewhere classified: Secondary | ICD-10-CM | POA: Diagnosis not present

## 2019-04-06 DIAGNOSIS — M25512 Pain in left shoulder: Secondary | ICD-10-CM | POA: Insufficient documentation

## 2019-04-06 DIAGNOSIS — R293 Abnormal posture: Secondary | ICD-10-CM | POA: Diagnosis present

## 2019-04-06 DIAGNOSIS — Z483 Aftercare following surgery for neoplasm: Secondary | ICD-10-CM | POA: Diagnosis present

## 2019-04-06 NOTE — Therapy (Signed)
[place], [place], [address] Phone: [phone]   Fax:  [phone]  Physical Therapy Treatment  Patient Details  Name: Samantha Mcneil MRN: [id number] Date of Birth: 07-05-1985 Referring Provider (PT): Dr. Marla Roe   Encounter Date: 04/06/2019  PT End of Session - 04/06/19 0819    Visit Number  3    Number of Visits  4    Date for PT Re-Evaluation  04/20/19    Authorization Type  Medicaid    Authorization Time Period  sending initial request 03/23/19; initial approval from 03/31/19-04/27/19 for 3 visits    Authorization - Visit Number  3    Authorization - Number of Visits  4    PT Start Time  0804    PT Stop Time  0855    PT Time Calculation (min)  51 min    Activity Tolerance  Patient tolerated treatment well    Behavior During Therapy  [facility] for tasks assessed/performed       Past Medical History:  Diagnosis Date  . Anxiety   . Depression   . Family history of breast cancer   . Family history of liver cancer   . Family history of lung cancer   . Headache    migraines  . History of kidney stones     Past Surgical History:  Procedure Laterality Date  . BREAST RECONSTRUCTION WITH PLACEMENT OF TISSUE EXPANDER AND FLEX HD (ACELLULAR HYDRATED DERMIS) Bilateral 10/13/2018   Procedure: BILATERAL IMMEDIATE BREAST RECONSTRUCTION WITH PLACEMENT OF TISSUE EXPANDER AND FLEX HD (ACELLULAR HYDRATED DERMIS);  Surgeon: Wallace Going, DO;  Location: [place];  Service: Plastics;  Laterality: Bilateral;  . CESAREAN SECTION    . MASTECTOMY W/ SENTINEL NODE BIOPSY Bilateral 10/13/2018   Procedure: BILATERAL SKIN SPARING MASTECTOMIES WITH LEFT SENTINEL  LYMPH NODE BIOPSY.;  Surgeon: [name], MD;  Location: [place];  Service: General;  Laterality: Bilateral;  . OTHER SURGICAL HISTORY    . PORT-A-CATH REMOVAL N/A 01/13/2019   Procedure: REMOVAL PORT-A-CATH;   Surgeon: Wallace Going, DO;  Location: [place];  Service: Plastics;  Laterality: N/A;  total case time is 2 hours  . PORTACATH PLACEMENT Left 04/01/2018   Procedure: INSERTION PORT-A-CATH;  Surgeon: [name], MD;  Location: [place];  Service: General;  Laterality: Left;  . REMOVAL OF BILATERAL TISSUE EXPANDERS WITH PLACEMENT OF BILATERAL BREAST IMPLANTS Bilateral 01/13/2019   Procedure: REMOVAL OF BILATERAL TISSUE EXPANDERS WITH PLACEMENT OF BILATERAL BREAST IMPLANTS;  Surgeon: Wallace Going, DO;  Location: [place];  Service: Plastics;  Laterality: Bilateral;    There were no vitals filed for this visit.  Subjective Assessment - 04/06/19 0811    Subjective  My Lt shoulder is getting alot better. I'm still doing the stretches but I still have a good bit of tightness at the end of the motion.    Pertinent History  s/p bilateral mastectomy with L SLNB 10/13/18 for treatment of left breast cancer (triple negative stage II), BRCA 2 +, pt has completed chemo, expanders placed 10/13/18 and implants placed 01/13/19    Patient Stated Goals  to not have any more pain, I want to be able to do what I used to do because I now I can't do anything    Currently in Pain?  No/denies         [facility] PT Assessment - 04/06/19 0001  AROM   Left Shoulder Flexion  149 Degrees    Left Shoulder ABduction  157 Degrees   just tightness in axilla now                  [facility] Adult PT Treatment/Exercise - 04/06/19 0001      Shoulder Exercises: Supine   Horizontal ABduction  Strengthening;Both;5 reps;Theraband    Theraband Level (Shoulder Horizontal ABduction)  Level 1 (Yellow)    External Rotation  Strengthening;Both;5 reps;Theraband    Theraband Level (Shoulder External Rotation)  Level 1 (Yellow)    Flexion  Strengthening;Both;5 reps;Theraband   Narrow and Wide Grip, 5 times each   Theraband Level (Shoulder Flexion)  Level 1  (Yellow)    Diagonals  Strengthening;Right;Left;5 reps;Theraband    Theraband Level (Shoulder Diagonals)  Level 1 (Yellow)    Diagonals Limitations  Pt returned therapist demo for all above exercises      Shoulder Exercises: Pulleys   Flexion  2 minutes    Flexion Limitations  Pt returned therapist demo and min VCs to relax Lt shoulder    ABduction  2 minutes    ABduction Limitations  Pt returning therapist demo and VCs throughout to relax Lt shoulder      Shoulder Exercises: Therapy Ball   Flexion  Both;10 reps   forward lean into end of stretch   Flexion Limitations  Pt returning therapist demo    ABduction  Left;5 reps   same side lean into end of stretch   ABduction Limitations  Demo for technique      Shoulder Exercises: Stretch   Other Shoulder Stretches  Modified downward dog on wall 5x, 5 sec holds      Manual Therapy   Manual Therapy  Myofascial release;Passive ROM    Myofascial Release  In Supine to Lt axilla during P/ROM and UE pulling throughout P/ROM    Passive ROM  In Supine to pts tolerance into flexion, abduction and D2; pt with very limited tolerance due to tightness             PT Education - 04/06/19 0830    Education Details  Supine scapular series with yellow theraband instructing pt to only do these every other day for now, still focsuing on improving her end A/ROM with stretching throughout day    Person(s) Educated  Patient    Methods  Explanation;Demonstration;Handout    Comprehension  Verbalized understanding;Returned demonstration          PT Long Term Goals - 03/23/19 4696      PT LONG TERM GOAL #1   Title  Pt will demonstrate 165 degrees of left shoulder flexion to allow her to reach items overhead    Baseline  143    Time  4    Period  Weeks    Status  New    Target Date  04/20/19      PT LONG TERM GOAL #2   Title  Pt will demonstrate 165 degrees of left shoulder abduction to allow her to reach out to the side    Baseline  140     Time  4    Period  Weeks    Status  New    Target Date  04/20/19      PT LONG TERM GOAL #3   Title  Pt will be independent in a home exercise program for continued strengthening and stretching    Time  4    Period  Weeks  Status  New    Target Date  04/20/19      PT LONG TERM GOAL #4   Title  Pt will report a 75% improvement in tightness in left upper quadrant to allow improved comfort    Time  4    Period  Weeks    Status  New    Target Date  04/20/19            Plan - 04/06/19 9311    Clinical Impression Statement  Progressed AA/ROM stretches today and despite cuing required throughout to relax Lt shoulder, pt tolerated all well and reported feeling good stretches, especially with modified downward dog on wall. Continued with Lt shoulder P/ROM and measured A/ROM which is progressing well. Also progressed HEP to include supine scapular series. Pt was able to return good demo and no pain reported.    Examination-Activity Limitations  Reach Overhead;Carry;Sleep    Examination-Participation Restrictions  Driving;Meal Prep;Yard Work;Community Activity;Cleaning;Laundry    Stability/Clinical Decision Making  Stable/Uncomplicated    Rehab Potential  Excellent    PT Frequency  Other (comment)   3 visits per Josem Kaufmann   PT Duration  4 weeks    PT Treatment/Interventions  ADLs/Self Care Home Management;Therapeutic exercise;Therapeutic activities;Patient/family education;Manual techniques;Scar mobilization;Passive range of motion;Taping    PT Next Visit Plan  Renewal for Medicaid needed next if pt would like to cont. Cont AAROM and PROM to left shoulder, review supine scpaular series and cont pulleys and ball roll up wall    PT Home Exercise Plan  post op breast; supine dowel exs; supine scapular series    Consulted and Agree with Plan of Care  Patient       Patient will benefit from skilled therapeutic intervention in order to improve the following deficits and impairments:   Decreased knowledge of precautions, Decreased range of motion, Decreased strength, Impaired UE functional use, Postural dysfunction, Pain  Visit Diagnosis: Stiffness of left shoulder, not elsewhere classified  Acute pain of left shoulder  Aftercare following surgery for neoplasm  Abnormal posture     Problem List Patient Active Problem List   Diagnosis Date Noted  . S/P mastectomy, bilateral 10/29/2018  . Acquired absence of breast 10/20/2018  . Breast cancer ([place]) 10/13/2018  . BRCA2 gene mutation positive 04/28/2018  . Genetic testing 04/24/2018  . Family history of breast cancer   . Family history of lung cancer   . Family history of liver cancer   . Port-A-Cath in place 04/09/2018  . Malignant neoplasm of upper-outer quadrant of left breast in female, estrogen receptor negative ([place]) 03/18/2018    Otelia Limes, PTA 04/06/2019, 9:06 AM  [place], [place], [address] Phone: [phone]   Fax:  [phone]  Name: Samantha Mcneil MRN: [id number] Date of Birth: 1986/02/14

## 2019-04-06 NOTE — Patient Instructions (Signed)

## 2019-04-08 MED FILL — VENLAFAXINE HCL ER 75 MG CA: 75 | 90 days supply | Qty: 90 | Fill #2

## 2019-04-13 ENCOUNTER — Ambulatory Visit: Payer: BC Managed Care – PPO

## 2019-04-13 ENCOUNTER — Other Ambulatory Visit: Payer: Self-pay

## 2019-04-13 DIAGNOSIS — M25612 Stiffness of left shoulder, not elsewhere classified: Secondary | ICD-10-CM | POA: Diagnosis not present

## 2019-04-13 DIAGNOSIS — Z483 Aftercare following surgery for neoplasm: Secondary | ICD-10-CM

## 2019-04-13 DIAGNOSIS — R293 Abnormal posture: Secondary | ICD-10-CM

## 2019-04-13 DIAGNOSIS — M25512 Pain in left shoulder: Secondary | ICD-10-CM

## 2019-04-13 NOTE — Patient Instructions (Signed)
3 Way Raises:     Cancer Rehab [phone]      Starting Position:  Leaning against wall, walk feet a few inches away from the wall and make tummy tight (tuck hips underneath you) Press back/shoulders/head against wall as much as possible. Keep thumbs up to ceiling, elbows straight and shoulders relaxed/down throughout.  1. Lift arms in front to shoulder height 2. Lift arms a little wider into a "V" to shoulder height 3. Lift arms out to sides in a "T" to shoulder height  Perform 10 times in each direction. Hold 1-2 lbs to start with and work up to 2-3 sets of 10/day. Perform 3-4 times/week. Increase weight as able, decreasing sets of 10 each time you increase weights, then slowly working your way back up to 2-3 sets each time.    Over Head Pull: Narrow and Wide Grip   On back, knees bent, feet flat, band across thighs, elbows straight but relaxed. Pull hands apart (start). Keeping elbows straight, bring arms up and over head, hands toward floor. Keep pull steady on band. Hold momentarily. Return slowly, keeping pull steady, back to start. Then do same with a wider grip on the band (past shoulder width) Repeat _5-10__ times. Band color __yellow____   Side Pull: Double Arm   On back, knees bent, feet flat. Arms perpendicular to body, shoulder level, elbows straight but relaxed. Pull arms out to sides, elbows straight. Resistance band comes across collarbones, hands toward floor. Hold momentarily. Slowly return to starting position. Repeat _5-10__ times. Band color _yellow____   Sword   On back, knees bent, feet flat, left hand on left hip, right hand above left. Pull right arm DIAGONALLY (hip to shoulder) across chest. Bring right arm along head toward floor. Hold momentarily. Slowly return to starting position. Repeat _5-10__ times. Do with left arm. Band color _yellow_____   Shoulder Rotation: Double Arm   On back, knees bent, feet flat, elbows tucked at sides, bent 90, hands  palms up. Pull hands apart and down toward floor, keeping elbows near sides. Hold momentarily. Slowly return to starting position. Repeat _5-10__ times. Band color __yellow____   CHEST: Doorway, Bilateral - Standing    Standing in doorway, place hands on wall with elbows bent at shoulder height. Lean forward. Hold _20-30__ seconds. _3__ reps per set, _2-3__ sets per day

## 2019-04-13 NOTE — Therapy (Addendum)
[place], [place], [address] Phone: [phone]   Fax:  [phone]  Physical Therapy Treatment  Patient Details  Name: Samantha Mcneil MRN: [id number] Date of Birth: Apr 01, 1986 Referring Provider (PT): Dr. Marla Roe   Encounter Date: 04/13/2019  PT End of Session - 04/13/19 0859    Visit Number  4    Number of Visits  4    Date for PT Re-Evaluation  04/20/19    Authorization Time Period  sending initial request 03/23/19; initial approval from 03/31/19-04/27/19 for 3 visits    PT Start Time  0805    PT Stop Time  0859    PT Time Calculation (min)  54 min    Activity Tolerance  Patient tolerated treatment well    Behavior During Therapy  [facility] for tasks assessed/performed       Past Medical History:  Diagnosis Date  . Anxiety   . Depression   . Family history of breast cancer   . Family history of liver cancer   . Family history of lung cancer   . Headache    migraines  . History of kidney stones     Past Surgical History:  Procedure Laterality Date  . BREAST RECONSTRUCTION WITH PLACEMENT OF TISSUE EXPANDER AND FLEX HD (ACELLULAR HYDRATED DERMIS) Bilateral 10/13/2018   Procedure: BILATERAL IMMEDIATE BREAST RECONSTRUCTION WITH PLACEMENT OF TISSUE EXPANDER AND FLEX HD (ACELLULAR HYDRATED DERMIS);  Surgeon: Wallace Going, DO;  Location: [place];  Service: Plastics;  Laterality: Bilateral;  . CESAREAN SECTION    . MASTECTOMY W/ SENTINEL NODE BIOPSY Bilateral 10/13/2018   Procedure: BILATERAL SKIN SPARING MASTECTOMIES WITH LEFT SENTINEL  LYMPH NODE BIOPSY.;  Surgeon: [name], MD;  Location: [place];  Service: General;  Laterality: Bilateral;  . OTHER SURGICAL HISTORY    . PORT-A-CATH REMOVAL N/A 01/13/2019   Procedure: REMOVAL PORT-A-CATH;  Surgeon: Wallace Going, DO;  Location: [place];  Service: Plastics;  Laterality:  N/A;  total case time is 2 hours  . PORTACATH PLACEMENT Left 04/01/2018   Procedure: INSERTION PORT-A-CATH;  Surgeon: [name], MD;  Location: [place];  Service: General;  Laterality: Left;  . REMOVAL OF BILATERAL TISSUE EXPANDERS WITH PLACEMENT OF BILATERAL BREAST IMPLANTS Bilateral 01/13/2019   Procedure: REMOVAL OF BILATERAL TISSUE EXPANDERS WITH PLACEMENT OF BILATERAL BREAST IMPLANTS;  Surgeon: Wallace Going, DO;  Location: [place];  Service: Plastics;  Laterality: Bilateral;    There were no vitals filed for this visit.  Subjective Assessment - 04/13/19 0810    Subjective  My Lt shoulder pain and motion is doing alot better.    Pertinent History  s/p bilateral mastectomy with L SLNB 10/13/18 for treatment of left breast cancer (triple negative stage II), BRCA 2 +, pt has completed chemo, expanders placed 10/13/18 and implants placed 01/13/19    Patient Stated Goals  to not have any more pain, I want to be able to do what I used to do because I now I can't do anything    Currently in Pain?  No/denies         [facility] PT Assessment - 04/13/19 0001      AROM   Left Shoulder Flexion  165 Degrees    Left Shoulder ABduction  166 Degrees                   [facility] Adult PT Treatment/Exercise -  04/13/19 0001      Self-Care   Self-Care  Other Self-Care Comments    Other Self-Care Comments   Spent time answering pts questions and educating her about proper time to wear her compression sleeve and how to progress exercises "low and slow"      Shoulder Exercises: Standing   Other Standing Exercises  Bil UE 3 way raises with 1 lb with pt returning hterapist demo into flexion, scaptiona nd abduction to shoulder height only 10 times each    Other Standing Exercises  Doorway pectoralis stretch 3x, 10 sec holds      Manual Therapy   Myofascial Release  In Supine to Lt axilla during P/ROM instructing pt how to do this at home in doorway and UE  pulling throughout P/ROM    Passive ROM  In Supine to pts tolerance into flexion, abduction and D2; pt with very limited tolerance due to tightness             PT Education - 04/13/19 0902    Education Details  Reissued supine scapular series per pt request; bil UE 3 way raises and doorway pectoralis stretch    Person(s) Educated  Patient    Methods  Explanation;Demonstration;Handout    Comprehension  Verbalized understanding;Returned demonstration          PT Long Term Goals - 04/13/19 0824      PT LONG TERM GOAL #1   Title  Pt will demonstrate 165 degrees of left shoulder flexion to allow her to reach items overhead    Baseline  143; 165 degrees - 04/13/19    Status  Achieved      PT LONG TERM GOAL #2   Title  Pt will demonstrate 165 degrees of left shoulder abduction to allow her to reach out to the side    Baseline  140; 166 degrees - 04/13/19    Status  Achieved      PT LONG TERM GOAL #3   Title  Pt will be independent in a home exercise program for continued strengthening and stretching    Status  Achieved      PT LONG TERM GOAL #4   Title  Pt will report a 75% improvement in tightness in left upper quadrant to allow improved comfort    Baseline  90% improvement reported - 04/13/19    Status  Achieved            Plan - 04/13/19 0859    Clinical Impression Statement  Pt has done well with this episode of care and is ready for D/C at this time. She was confused about converstaion about compression sleeve so did not come yesterday but after further discussion it was determined that pt ordered a compression sleeve from online a while ago and so instructed her on best times to wear this, which isn't often now as she has no swelling. Mostly just if she starts to notice swelling with exercising. Pt verbalized understanding.    Examination-Activity Limitations  Reach Overhead;Carry;Sleep    Examination-Participation Restrictions  Driving;Meal Prep;Yard  Work;Community Activity;Cleaning;Laundry    Stability/Clinical Decision Making  Stable/Uncomplicated    Rehab Potential  Excellent    PT Frequency  Other (comment)   3 visits per Josem Kaufmann   PT Duration  4 weeks    PT Treatment/Interventions  ADLs/Self Care Home Management;Therapeutic exercise;Therapeutic activities;Patient/family education;Manual techniques;Scar mobilization;Passive range of motion;Taping    PT Next Visit Plan  D/C this visit.    PT Home  Exercise Plan  post op breast; supine dowel exs; supine scapular series; 3 way raises    Consulted and Agree with Plan of Care  Patient       Patient will benefit from skilled therapeutic intervention in order to improve the following deficits and impairments:  Decreased knowledge of precautions, Decreased range of motion, Decreased strength, Impaired UE functional use, Postural dysfunction, Pain  Visit Diagnosis: Stiffness of left shoulder, not elsewhere classified  Acute pain of left shoulder  Aftercare following surgery for neoplasm  Abnormal posture     Problem List Patient Active Problem List   Diagnosis Date Noted  . S/P mastectomy, bilateral 10/29/2018  . Acquired absence of breast 10/20/2018  . Breast cancer ([facility]) 10/13/2018  . BRCA2 gene mutation positive 04/28/2018  . Genetic testing 04/24/2018  . Family history of breast cancer   . Family history of lung cancer   . Family history of liver cancer   . Port-A-Cath in place 04/09/2018  . Malignant neoplasm of upper-outer quadrant of left breast in female, estrogen receptor negative ([place]) 03/18/2018    Samantha Mcneil, PTA 04/13/2019, 9:06 AM  [place], [place], [address] Phone: [phone]   Fax:  [phone]  Name: Samantha Mcneil MRN: [id number] Date of Birth: 1986/06/28  PHYSICAL THERAPY DISCHARGE SUMMARY  Visits from Start of Care: 4  Current functional  level related to goals / functional outcomes: All goals met   Remaining deficits: none   Education / Equipment: HEP  Plan: Patient agrees to discharge.  Patient goals were met. Patient is being discharged due to meeting the stated rehab goals.  ?????    Samantha Mcneil, [place] 04/20/19 12:01 PM

## 2019-04-20 MED FILL — NORGESTIMATE-ETH ESTRADIOL: 0.25-35 | 28 days supply | Qty: 28 | Fill #1

## 2019-04-25 NOTE — Progress Notes (Signed)
Patient Care Team: Patient, [name] as PCP - General (General Practice) Dillingham, Loel Lofty, DO as Attending Physician (Plastic Surgery) [name], MD as Consulting Physician (General Surgery) Nicholas Lose, MD as Consulting Physician (Hematology and Oncology)  DIAGNOSIS:    ICD-10-CM   1. Malignant neoplasm of upper-outer quadrant of left breast in female, estrogen receptor negative ([place])  C50.412    Z17.1     SUMMARY OF ONCOLOGIC HISTORY: Oncology History  Malignant neoplasm of upper-outer quadrant of left breast in female, estrogen receptor negative ([place])  03/12/2018 Initial Diagnosis   Palpable masses in both breasts with family history of breast cancer, breast density category D, ultrasound measured these hypoechoic irregular mass left breast 2.3 cm, right breast no abnormality, biopsy left breast mass: IDC grade 3 ER 0%, PR 0%, HER-2 negative, Ki-67 90%, lymph node biopsy benign: T2N0   03/18/2018 Cancer Staging   Staging form: Breast, AJCC 8th Edition - Clinical: Stage IIB (cT2, cN0, cM0, G3, ER-, PR-, HER2-) - Signed by Nicholas Lose, MD on 03/18/2018   03/30/2018 Breast MRI   Single biopsy-proven malignancy in the left 12 o'clock breast measures 2.3 x 1.3 x 1.5 cm.  Right breast without any malignancy detected.   04/02/2018 - 08/20/2018 Neo-Adjuvant Chemotherapy   Neoadjuvant chemotherapy with dose dense Adriamycin and Cytoxan followed by Taxol and carboplatin   04/23/2018 Genetic Testing   Positive genetic testing: A pathogenic variant was identified in BRCA2 called [id number] (p.Ala2951Glyfs*26) on the Common Hereditary Cancers Panel. The Common Hereditary Cancers Panel offered by [facility] includes sequencing and/or deletion duplication testing of the following 47 genes: APC, ATM, AXIN2, BARD1, BMPR1A, BRCA1, BRCA2, BRIP1, CDH1, CDKN2A (p14ARF), CDKN2A (p16INK4a), CKD4, CHEK2, CTNNA1, DICER1, EPCAM (Deletion/duplication testing only), GREM1 (promoter region  deletion/duplication testing only), KIT, MEN1, MLH1, MSH2, MSH3, MSH6, MUTYH, NBN, NF1, NHTL1, PALB2, PDGFRA, PMS2, POLD1, POLE, PTEN, RAD50, RAD51C, RAD51D, SDHB, SDHC, SDHD, SMAD4, SMARCA4. STK11, TP53, TSC1, TSC2, and VHL.  The following genes were evaluated for sequence changes only: SDHA and HOXB13 c.251G>A variant only. Genetic testing did detect a Variant of Unknown Significance (VUS) in the PALB2 gene called c.205C>T.  At this time, it is unknown if this variant is associated with increased cancer risk or if this is a normal finding, but most variants such as this get reclassified to being inconsequential. It should not be used to make medical management decisions.   The report date is 04/23/2018.    10/13/2018 Surgery   Bilateral mastectomies: right, benign; left, complete therapeutic response, no residual carcinoma, 3 SLN negative   10/15/2018 Cancer Staging   Staging form: Breast, AJCC 8th Edition - Pathologic: No Stage Recommended (ypT0, pN0, cM0) - Signed by [name], NP on 10/15/2018     CHIEF COMPLIANT: Surveillance of triple negative left breast cancer  INTERVAL HISTORY: Samantha Mcneil is a 33 y.o. with above-mentioned history of triple negative left breast cancer who completed neoadjuvant chemotherapy and underwent bilateral mastectomies with complete response to treatment. She presents today to the clinic today for follow-up.  The breast pain related to reconstruction has resolved. 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 [employer]:   Constitutional: Denies fevers, chills or abnormal weight loss Eyes: Denies blurriness of vision Ears, nose, mouth, throat, and face: Denies mucositis or sore throat Respiratory: Denies cough, dyspnea or wheezes Cardiovascular: Denies palpitation, chest discomfort Gastrointestinal: Denies nausea, heartburn or change in bowel habits Skin: Denies abnormal skin rashes Lymphatics: Denies  new  lymphadenopathy or easy bruising Neurological: Denies numbness, tingling or new weaknesses Behavioral/Psych: Mood is stable, no new changes  Extremities: No lower extremity edema Breast: denies any pain or lumps or nodules in either breasts All other systems were reviewed with the patient and are negative.  I have reviewed the past medical history, past surgical history, social history and family history with the patient and they are unchanged from previous note.  ALLERGIES:  has No Known Allergies.  MEDICATIONS:  Current Outpatient Medications  Medication Sig Dispense Refill  . norgestimate-ethinyl estradiol (ORTHO-CYCLEN, 28,) 0.25-35 MG-MCG tablet Take 1 tablet by mouth daily. 1 Package 11  . venlafaxine XR (EFFEXOR-XR) 75 MG 24 hr capsule Take 1 capsule (75 mg total) by mouth daily with breakfast. 90 capsule 3   No current facility-administered medications for this visit.     PHYSICAL EXAMINATION: ECOG PERFORMANCE STATUS: 1 - Symptomatic but completely ambulatory  Vitals:   04/26/19 1453  BP: 125/90  Pulse: 87  Resp: 18  Temp: 98.9 F (37.2 C)  SpO2: 100%   Filed Weights   04/26/19 1453  Weight: 162 lb 6.4 oz (73.7 kg)    GENERAL: alert, no distress and comfortable SKIN: skin color, texture, turgor are normal, no rashes or significant lesions EYES: normal, Conjunctiva are pink and non-injected, sclera clear OROPHARYNX: no exudate, no erythema and lips, buccal mucosa, and tongue normal  NECK: supple, thyroid normal size, non-tender, without nodularity LYMPH: no palpable lymphadenopathy in the cervical, axillary or inguinal LUNGS: clear to auscultation and percussion with normal breathing effort HEART: regular rate & rhythm and no murmurs and no lower extremity edema ABDOMEN: abdomen soft, non-tender and normal bowel sounds MUSCULOSKELETAL: no cyanosis of digits and no clubbing  NEURO: alert & oriented x 3 with fluent speech, no focal motor/sensory deficits  EXTREMITIES: No lower extremity edema BREAST: No palpable masses or nodules in either right or left breasts. No palpable axillary supraclavicular or infraclavicular adenopathy no breast tenderness or nipple discharge. (exam performed in the presence of a chaperone)  LABORATORY DATA:  I have reviewed the data as listed CMP Latest Ref Rng & Units 08/20/2018 08/13/2018 08/06/2018  Glucose 70 - 99 mg/dL 98 117(H) 91  BUN 6 - 20 mg/dL '8 12 12  ' Creatinine 0.44 - 1.00 mg/dL 0.66 0.66 0.63  Sodium 135 - 145 mmol/L 142 142 142  Potassium 3.5 - 5.1 mmol/L 3.7 4.1 4.1  Chloride 98 - 111 mmol/L 106 106 108  CO2 22 - 32 mmol/L '27 28 26  ' Calcium 8.9 - 10.3 mg/dL 9.4 9.7 9.5  Total Protein 6.5 - 8.1 g/dL 6.7 6.9 6.7  Total Bilirubin 0.3 - 1.2 mg/dL 0.3 0.3 0.4  Alkaline Phos 38 - 126 U/L 114 106 90  AST 15 - 41 U/L 18 41 21  ALT 0 - 44 U/L 33 62(H) 32    Lab Results  Component Value Date   WBC 6.7 04/26/2019   HGB 10.2 (L) 04/26/2019   HCT 33.3 (L) 04/26/2019   MCV 79.5 (L) 04/26/2019   PLT 355 04/26/2019   NEUTROABS 4.2 04/26/2019    ASSESSMENT & PLAN:  Malignant neoplasm of upper-outer quadrant of left breast in female, estrogen receptor negative ([name]) /05/2018:Palpable masses in both breasts with family history of breast cancer, breast density category D, ultrasound measured these hypoechoic irregular mass left breast 2.3 cm, right breast no abnormality, biopsy left breast mass: IDC grade 3 ER 0%, PR 0%, HER-2 negative, Ki-67 90%, lymph node  biopsy benign: T2N0, stage IIb  Recommendationbased on multidisciplinary tumor board: Genetics consultation 1. Neoadjuvant chemotherapy with Adriamycin and Cytoxan dose dense 4 followed byTaxol with carboplatinweekly 12completed 08/20/2018 2. Followed bybilateral mastectomies with reconstructionwith targeted axillary dissection 10/13/2018: Complete pathologic response, 0/3 lymph nodes negative  BRCA2 mutation: Bilateral mastectomies 10/13/2018  Oophorectomy after the age of 47 ---------------------------------------------------------------------------------------------------------------- Plan: surveillance. 1.  Breast examination: Bilateral reconstructed breasts without any lumps or nodules of concern 2.  No role of imaging studies because she had bilateral mastectomies. Depression: Currently on Effexor 75 mg daily, today we reduce the dose of 37.5. After few months she will cut it down to 3 times a week then twice a week then once a week and then discontinue it in about 6 months.  Return to clinic in 1 year for surveillance and follow-up  No orders of the defined types were placed in this encounter.  The patient has a good understanding of the overall plan. she agrees with it. she will call with any problems that may develop before the next visit here.  Nicholas Lose, MD 04/26/2019  Julious Oka Dorshimer am acting as scribe for Dr. Nicholas Lose.  I have reviewed the above documentation for accuracy and completeness, and I agree with the above.

## 2019-04-26 ENCOUNTER — Inpatient Hospital Stay: Payer: BC Managed Care – PPO

## 2019-04-26 ENCOUNTER — Other Ambulatory Visit: Payer: Self-pay

## 2019-04-26 ENCOUNTER — Inpatient Hospital Stay: Payer: BC Managed Care – PPO | Attending: Adult Health | Admitting: Hematology and Oncology

## 2019-04-26 VITALS — BP 125/90 | HR 87 | Temp 98.9°F | Resp 18 | Ht 64.0 in | Wt 162.4 lb

## 2019-04-26 DIAGNOSIS — F329 Major depressive disorder, single episode, unspecified: Secondary | ICD-10-CM | POA: Insufficient documentation

## 2019-04-26 DIAGNOSIS — Z171 Estrogen receptor negative status [ER-]: Secondary | ICD-10-CM | POA: Diagnosis not present

## 2019-04-26 DIAGNOSIS — C50412 Malignant neoplasm of upper-outer quadrant of left female breast: Secondary | ICD-10-CM

## 2019-04-26 DIAGNOSIS — Z23 Encounter for immunization: Secondary | ICD-10-CM | POA: Insufficient documentation

## 2019-04-26 DIAGNOSIS — Z803 Family history of malignant neoplasm of breast: Secondary | ICD-10-CM | POA: Diagnosis not present

## 2019-04-26 DIAGNOSIS — Z79899 Other long term (current) drug therapy: Secondary | ICD-10-CM | POA: Diagnosis not present

## 2019-04-26 LAB — CBC WITH DIFFERENTIAL (CANCER CENTER ONLY)
Abs Immature Granulocytes: 0.02 10*3/uL (ref 0.00–0.07)
Basophils Absolute: 0 10*3/uL (ref 0.0–0.1)
Basophils Relative: 1 %
Eosinophils Absolute: 0.1 10*3/uL (ref 0.0–0.5)
Eosinophils Relative: 1 %
HCT: 33.3 % — ABNORMAL LOW (ref 36.0–46.0)
Hemoglobin: 10.2 g/dL — ABNORMAL LOW (ref 12.0–15.0)
Immature Granulocytes: 0 %
Lymphocytes Relative: 28 %
Lymphs Abs: 1.9 10*3/uL (ref 0.7–4.0)
MCH: 24.3 pg — ABNORMAL LOW (ref 26.0–34.0)
MCHC: 30.6 g/dL (ref 30.0–36.0)
MCV: 79.5 fL — ABNORMAL LOW (ref 80.0–100.0)
Monocytes Absolute: 0.5 10*3/uL (ref 0.1–1.0)
Monocytes Relative: 7 %
Neutro Abs: 4.2 10*3/uL (ref 1.7–7.7)
Neutrophils Relative %: 63 %
Platelet Count: 355 10*3/uL (ref 150–400)
RBC: 4.19 MIL/uL (ref 3.87–5.11)
RDW: 14 % (ref 11.5–15.5)
WBC Count: 6.7 10*3/uL (ref 4.0–10.5)
nRBC: 0 % (ref 0.0–0.2)

## 2019-04-26 MED ORDER — VENLAFAXINE HCL ER 37.5 MG PO CP24: 37.5000 mg | ORAL_CAPSULE | Freq: Every day | ORAL | 3 refills | Status: DC

## 2019-04-26 MED ORDER — INFLUENZA VAC SPLIT QUAD 0.5 ML IM SUSY
0.5000 mL | PREFILLED_SYRINGE | Freq: Once | INTRAMUSCULAR | Status: AC
  Administered 2019-04-26: 0.5 mL via INTRAMUSCULAR

## 2019-04-26 MED ORDER — INFLUENZA VAC SPLIT QUAD 0.5 ML IM SUSY
PREFILLED_SYRINGE | INTRAMUSCULAR | Status: AC
  Filled 2019-04-26: qty 0.5

## 2019-04-26 MED FILL — VENLAFAXINE HCL ER 37.5 MG: 37.5 | 90 days supply | Qty: 90 | Fill #0

## 2019-04-26 NOTE — Patient Instructions (Signed)
Influenza Virus Vaccine injection What is this medicine? INFLUENZA VIRUS VACCINE (in floo EN zuh VAHY ruhs vak SEEN) helps to reduce the risk of getting influenza also known as the flu. The vaccine only helps protect you against some strains of the flu. This medicine may be used for other purposes; ask your health care provider or pharmacist if you have questions. COMMON BRAND NAME(S): Afluria, Afluria Quadrivalent, Agriflu, Alfuria, FLUAD, Fluarix, Fluarix Quadrivalent, Flublok, Flublok Quadrivalent, FLUCELVAX, Flulaval, Fluvirin, Fluzone, Fluzone High-Dose, Fluzone Intradermal What should I tell my health care provider before I take this medicine? They need to know if you have any of these conditions:  bleeding disorder like hemophilia  fever or infection  Guillain-Barre syndrome or other neurological problems  immune system problems  infection with the human immunodeficiency virus (HIV) or AIDS  low blood platelet counts  multiple sclerosis  an unusual or allergic reaction to influenza virus vaccine, latex, other medicines, foods, dyes, or preservatives. Different brands of vaccines contain different allergens. Some may contain latex or eggs. Talk to your doctor about your allergies to make sure that you get the right vaccine.  pregnant or trying to get pregnant  breast-feeding How should I use this medicine? This vaccine is for injection into a muscle or under the skin. It is given by a health care professional. A copy of Vaccine Information Statements will be given before each vaccination. Read this sheet carefully each time. The sheet may change frequently. Talk to your healthcare provider to see which vaccines are right for you. Some vaccines should not be used in all age groups. Overdosage: If you think you have taken too much of this medicine contact a poison control center or emergency room at once. NOTE: This medicine is only for you. Do not share this medicine with  others. What if I miss a dose? This does not apply. What may interact with this medicine?  chemotherapy or radiation therapy  medicines that lower your immune system like etanercept, anakinra, infliximab, and adalimumab  medicines that treat or prevent blood clots like warfarin  phenytoin  steroid medicines like prednisone or cortisone  theophylline  vaccines This list may not describe all possible interactions. Give your health care provider a list of all the medicines, herbs, non-prescription drugs, or dietary supplements you use. Also tell them if you smoke, drink alcohol, or use illegal drugs. Some items may interact with your medicine. What should I watch for while using this medicine? Report any side effects that do not go away within 3 days to your doctor or health care professional. Call your health care provider if any unusual symptoms occur within 6 weeks of receiving this vaccine. You may still catch the flu, but the illness is not usually as bad. You cannot get the flu from the vaccine. The vaccine will not protect against colds or other illnesses that may cause fever. The vaccine is needed every year. What side effects may I notice from receiving this medicine? Side effects that you should report to your doctor or health care professional as soon as possible:  allergic reactions like skin rash, itching or hives, swelling of the face, lips, or tongue Side effects that usually do not require medical attention (report to your doctor or health care professional if they continue or are bothersome):  fever  headache  muscle aches and pains  pain, tenderness, redness, or swelling at the injection site  tiredness This list may not describe all possible side effects. Call your   doctor for medical advice about side effects. You may report side effects to FDA at 1-800-FDA-1088. Where should I keep my medicine? The vaccine will be given by a health care professional in a  clinic, pharmacy, doctor's office, or other health care setting. You will not be given vaccine doses to store at home. NOTE: This sheet is a summary. It may not cover all possible information. If you have questions about this medicine, talk to your doctor, pharmacist, or health care provider.  2020 Elsevier/Gold Standard (2018-05-12 08:45:43)  

## 2019-04-26 NOTE — Assessment & Plan Note (Signed)
/  05/2018:Palpable masses in both breasts with family history of breast cancer, breast density category D, ultrasound measured these hypoechoic irregular mass left breast 2.3 cm, right breast no abnormality, biopsy left breast mass: IDC grade 3 ER 0%, PR 0%, HER-2 negative, Ki-67 90%, lymph node biopsy benign: T2N0, stage IIb  Recommendationbased on multidisciplinary tumor board: Genetics consultation 1. Neoadjuvant chemotherapy with Adriamycin and Cytoxan dose dense 4 followed byTaxol with carboplatinweekly 12completed 08/20/2018 2. Followed bybilateral mastectomies with reconstructionwith targeted axillary dissection 10/13/2018: Complete pathologic response, 0/3 lymph nodes negative  BRCA2 mutation: Bilateral mastectomies 10/13/2018 Oophorectomy after the age of 84 ---------------------------------------------------------------------------------------------------------------- Plan: surveillance. 1.  Breast examination: Bilateral reconstructed breasts without any lumps or nodules of concern 2.  No role of imaging studies because she had bilateral mastectomies. Depression: Currently on Effexor 75 mg daily  Return to clinic in 1 year for surveillance and follow-up

## 2019-04-27 ENCOUNTER — Telehealth: Payer: Self-pay | Admitting: Hematology and Oncology

## 2019-04-27 NOTE — Telephone Encounter (Signed)
I talk with patient regarding schedule  

## 2019-04-30 ENCOUNTER — Other Ambulatory Visit: Payer: Self-pay

## 2019-04-30 ENCOUNTER — Ambulatory Visit (HOSPITAL_COMMUNITY)
Admission: RE | Admit: 2019-04-30 | Discharge: 2019-04-30 | Disposition: A | Discharge status: Home / Self Care | Payer: BC Managed Care – PPO | Source: Ambulatory Visit | Attending: Gynecologic Oncology | Admitting: Gynecologic Oncology

## 2019-04-30 DIAGNOSIS — Z1501 Genetic susceptibility to malignant neoplasm of breast: Secondary | ICD-10-CM | POA: Diagnosis not present

## 2019-04-30 DIAGNOSIS — Z1509 Genetic susceptibility to other malignant neoplasm: Secondary | ICD-10-CM | POA: Diagnosis present

## 2019-05-03 ENCOUNTER — Telehealth: Payer: Self-pay

## 2019-05-03 NOTE — Telephone Encounter (Signed)
LM for Samantha Mcneil that the US showed right ovary is normal.  The left ovary could not be seen due to bowels in the way but no large mass seen per [name], NP. She is to follow up with Dr. Phineas Real with in March 2021with [place] and CA-125. Call Dr. Serita Grit office in July in July to set up and [place], lab , and visit in September with Dr. Denman George. She can call the office if she has any questions or concerns.

## 2019-05-18 MED FILL — NORGESTIMATE-ETH ESTRADIOL: 0.25-35 | 28 days supply | Qty: 28 | Fill #2

## 2019-06-11 ENCOUNTER — Ambulatory Visit: Payer: Medicaid Other | Admitting: Plastic Surgery

## 2019-06-14 MED FILL — NORGESTIMATE-ETH ESTRADIOL: 0.25-35 | 28 days supply | Qty: 28 | Fill #3

## 2019-06-21 ENCOUNTER — Other Ambulatory Visit: Payer: Self-pay

## 2019-06-21 ENCOUNTER — Ambulatory Visit (INDEPENDENT_AMBULATORY_CARE_PROVIDER_SITE_OTHER): Payer: BC Managed Care – PPO | Admitting: Plastic Surgery

## 2019-06-21 ENCOUNTER — Encounter: Payer: Self-pay | Admitting: Plastic Surgery

## 2019-06-21 VITALS — BP 117/72 | HR 85 | Temp 97.3°F | Ht 64.0 in | Wt 167.6 lb

## 2019-06-21 DIAGNOSIS — Z9889 Other specified postprocedural states: Secondary | ICD-10-CM | POA: Diagnosis not present

## 2019-06-21 DIAGNOSIS — N651 Disproportion of reconstructed breast: Secondary | ICD-10-CM | POA: Diagnosis not present

## 2019-06-21 DIAGNOSIS — Z9013 Acquired absence of bilateral breasts and nipples: Secondary | ICD-10-CM

## 2019-06-21 NOTE — Progress Notes (Signed)
   Subjective:    Patient ID: Samantha Mcneil, female    DOB: 1985/10/20, 33 y.o.   MRN: [id number]  The patient is a 33 year old female here for follow-up on her bilateral breast reconstruction.  Her implants were placed in July.  They are much softer and overall she looks great.  She is very pleased with her result.  For some reason that we were not able to completely figure out she was a little tearful today.  Time of year and the holiday stress.  The incisions are healing nicely.  There is no sign of infection.  She seems happy with her size as well.  She does not like the loss of volume of the right upper breast.  This is commonly seen with mastectomies.  It is more so on the right than the left.     Review of Systems  Constitutional: Negative.   HENT: Negative.   Eyes: Negative.   Respiratory: Negative.   Cardiovascular: Negative.   Gastrointestinal: Negative.   Endocrine: Negative.   Genitourinary: Negative.   Musculoskeletal: Negative.        Objective:   Physical Exam Vitals and nursing note reviewed.  Constitutional:      Appearance: Normal appearance.  HENT:     Head: Normocephalic and atraumatic.  Cardiovascular:     Rate and Rhythm: Normal rate.  Pulmonary:     Effort: Pulmonary effort is normal.  Skin:    General: Skin is warm.     Capillary Refill: Capillary refill takes less than 2 seconds.  Neurological:     General: No focal deficit present.     Mental Status: She is alert and oriented to person, place, and time.  Psychiatric:        Mood and Affect: Mood normal.        Behavior: Behavior normal.        Thought Content: Thought content normal.        Assessment & Plan:     ICD-10-CM   1. S/P mastectomy, bilateral  Z90.13   2. Acquired absence of both breasts  Z90.13   3. S/P breast reconstruction  Z98.890   4. Breast asymmetry following reconstructive surgery  N65.1     Recommend fat grafting for improved symmetry and contour of the  breasts.  Pictures were obtained of the patient and placed in the chart with the patient's or guardian's permission. Request for procedure placed in chart.  The [place] was signed into law in 2016 which includes the topic of electronic health records.  This provides immediate access to information in MyChart.  This includes consultation notes, operative notes, office notes, lab results and pathology reports.  If you have any questions about what you read please let us know at your next visit or call us at the office.  We are right here with you.

## 2019-07-16 MED FILL — NORGESTIMATE-ETH ESTRADIOL: 0.25-35 | 28 days supply | Qty: 28 | Fill #4

## 2019-08-26 MED FILL — VYLIBRA 0.25-35 MG-MCG TABS: 0.25-35 | 28 days supply | Qty: 28 | Fill #5

## 2019-09-23 ENCOUNTER — Ambulatory Visit: Payer: 59 | Attending: Internal Medicine

## 2019-09-23 DIAGNOSIS — Z23 Encounter for immunization: Secondary | ICD-10-CM

## 2019-09-23 MED FILL — VYLIBRA 0.25-35 MG-MCG TABS: 0.25-35 | 28 days supply | Qty: 28 | Fill #6

## 2019-09-23 NOTE — Progress Notes (Signed)
   Covid-19 Vaccination Clinic  Name:  Kailah Marron    MRN: [id number] DOB: 12-11-1985  09/23/2019  Ms. Kawa was observed post Covid-19 immunization for 15 minutes without incident. She was provided with Vaccine Information Sheet and instruction to access the V-Safe system.   Ms. Pigman was instructed to call 911 with any severe reactions post vaccine: Marland Kitchen Difficulty breathing  . Swelling of face and throat  . A fast heartbeat  . A bad rash all over body  . Dizziness and weakness   Immunizations Administered    Name Date Dose VIS Date Route   Pfizer COVID-19 Vaccine 09/23/2019  8:42 AM 0.3 mL 06/11/2019 Intramuscular   Manufacturer: [place]   Lot: [id number]   [place]: [id number]

## 2019-10-13 IMAGING — MR MR BILATERAL BREAST WITHOUT AND WITH CONTRAST
6 of 15 series · 18 of 48 positions shown · IV contrast (gadavist)
Comparison: 03/30/2018 and earlier
COMPARISON: 03/30/2018 and earlier

Addendum:
CLINICAL DATA: Invasive breast cancer, stage II or IIa. Status post
neoadjuvant systemic therapy. Assess treatment response.

LABS:  None obtained at the time of imaging.
EXAM:
BILATERAL BREAST MRI WITH AND WITHOUT CONTRAST
TECHNIQUE: Multiplanar, multisequence MR images of both breasts were obtained
prior to and following the intravenous administration of 7 ml of
Gadavist

[Series 1: 3 plane loc · axial · 7.0mm · 1.56mm/px · 1 of 25 slices shown]
[im 1/25]
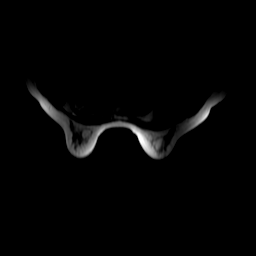

[Series 4: ax ir · axial · 3.0mm · 0.59mm/px · 1 of 70 slices shown]
[im 1/70]
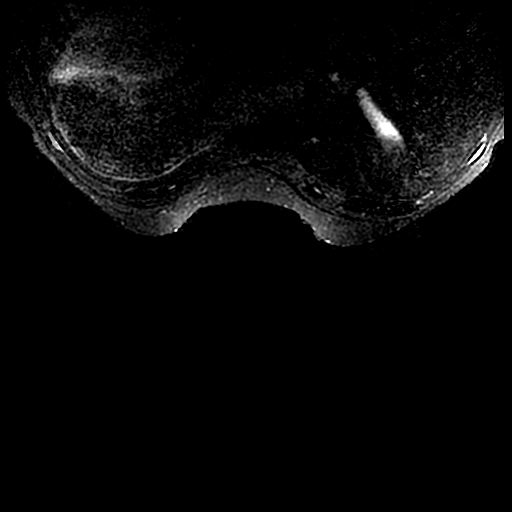

[Series 500: 7ml vibrant mph · axial · 1.8mm · 0.59mm/px · z∈[-162,+46]mm · 5 of 232 slices shown]
[im 1/232]
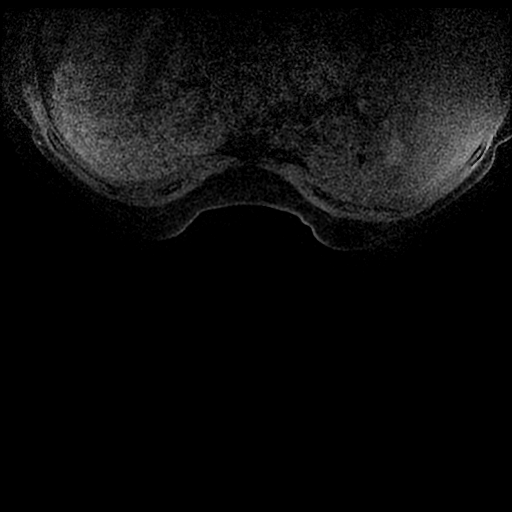
[im 58/232]
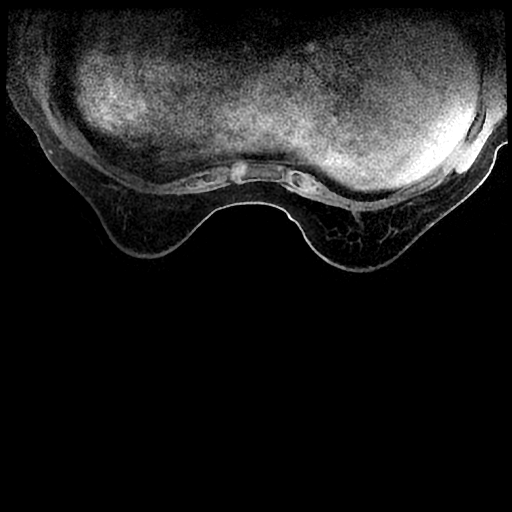
[im 116/232]
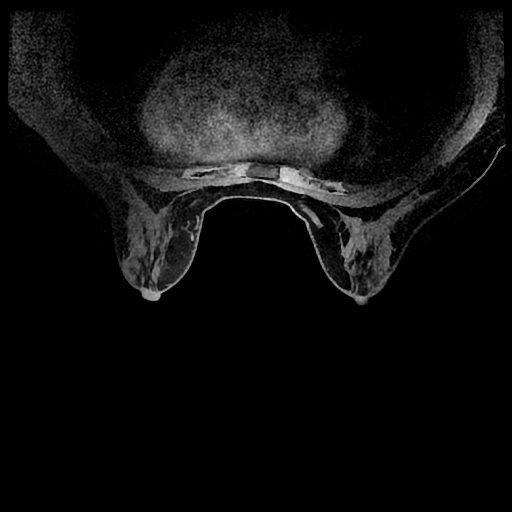
[im 174/232]
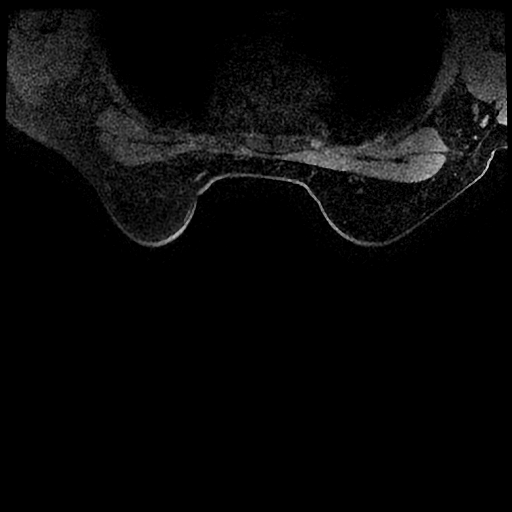
[im 232/232]
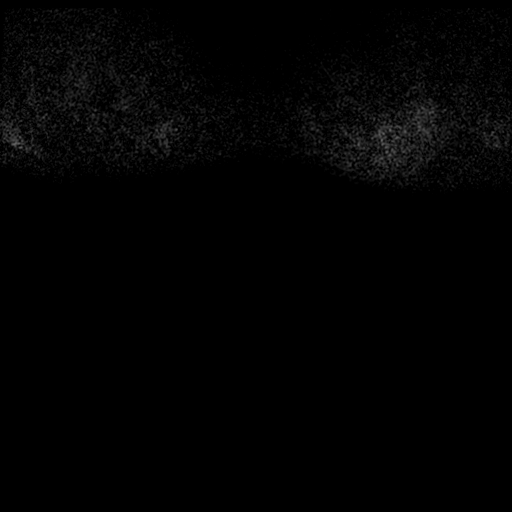

[Series 501: ph1/7ml vibrant mph · axial · 1.8mm · 0.59mm/px · z∈[-162,+46]mm · 5 of 232 slices shown]
[im 1/232]
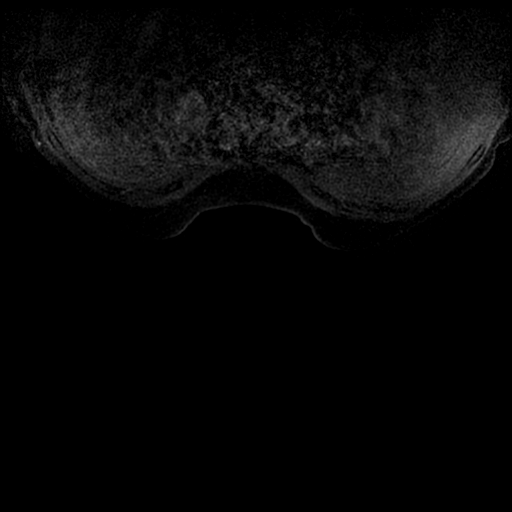
[im 58/232]
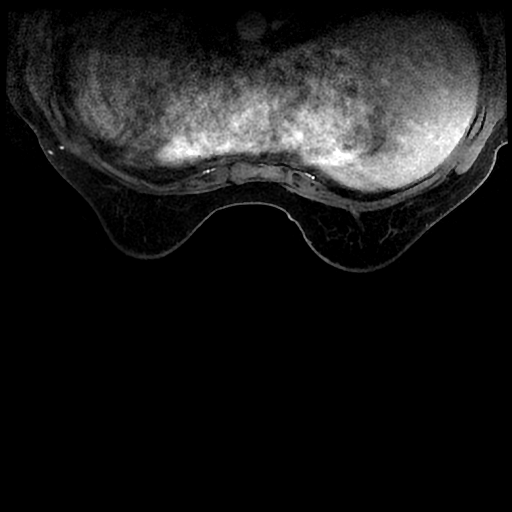
[im 116/232]
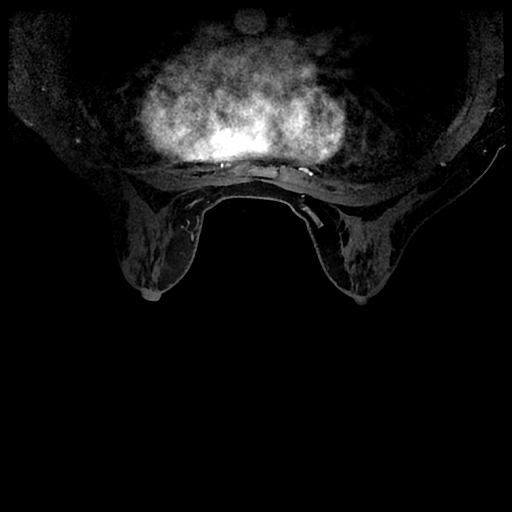
[im 174/232]
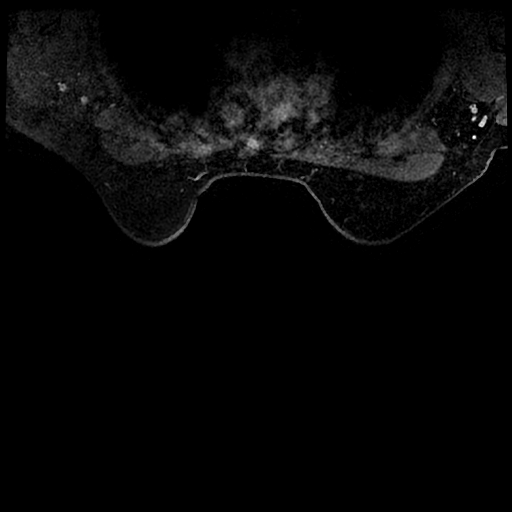
[im 232/232]
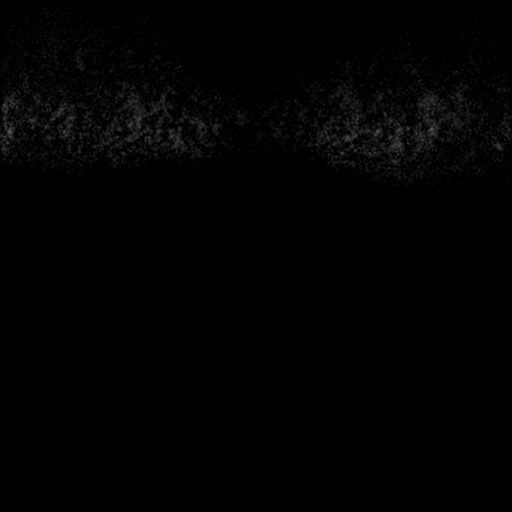

[Series 502: ph2/7ml vibrant mph · axial · 1.8mm · 0.59mm/px · z∈[-162,+46]mm · 5 of 232 slices shown]
[im 1/232]
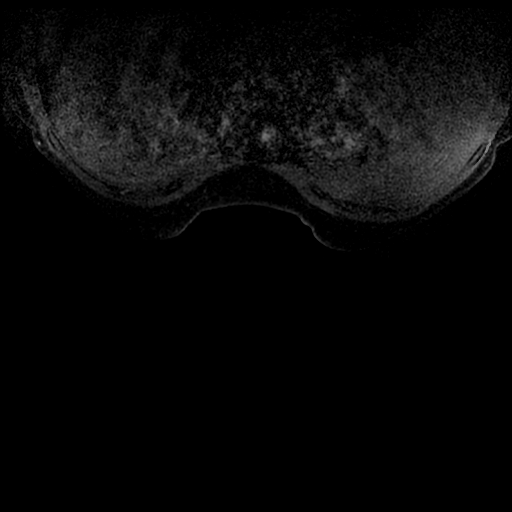
[im 58/232]
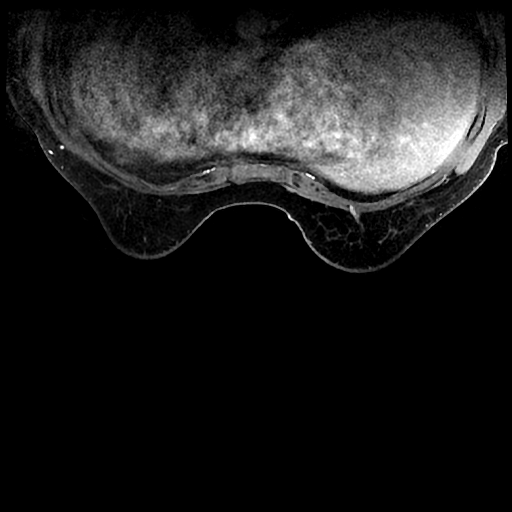
[im 116/232]
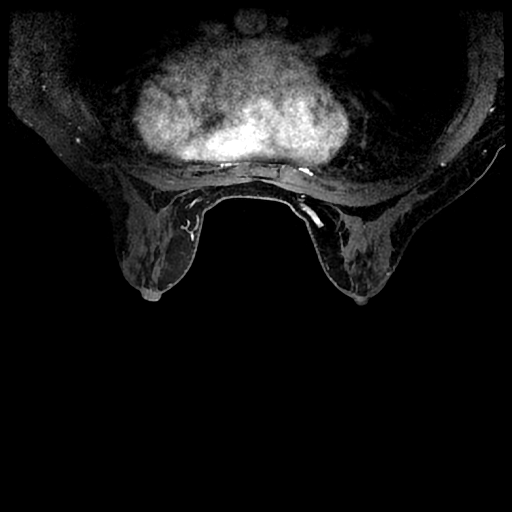
[im 174/232]
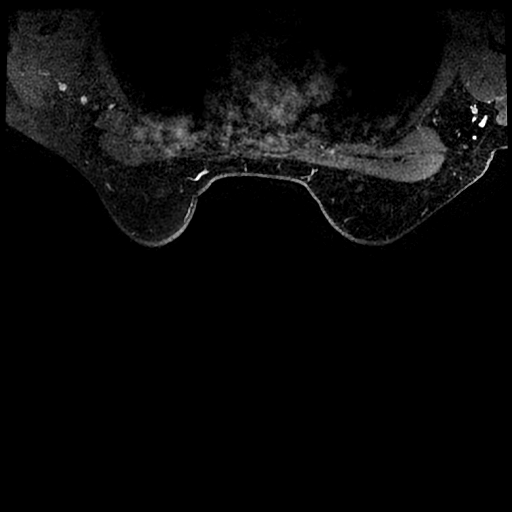
[im 232/232]
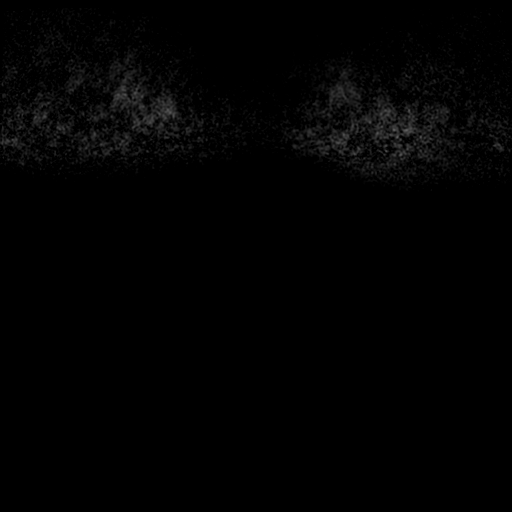

[Series 503: ph3/7ml vibrant mph · axial · 1.8mm · 0.59mm/px · 1 of 232 slices shown]
[im 1/232]
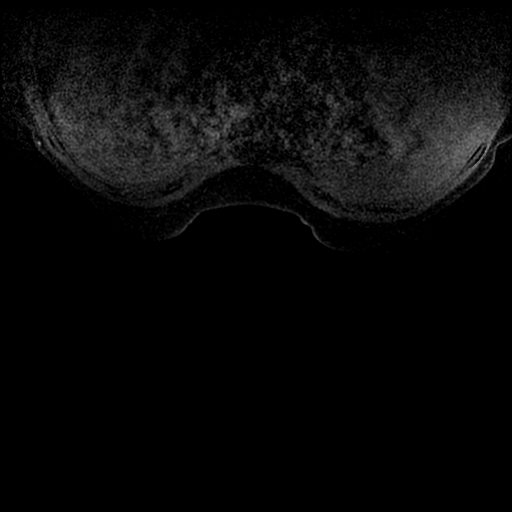

[18 of 48 positions shown; findings below may reference images not displayed]

Three-dimensional MR images were rendered by post-processing of the
original MR data on an independent workstation. The
three-dimensional MR images were interpreted, and findings are
reported in the following complete MRI report for this study. Three
dimensional images were evaluated at the independent DynaC[employer]
workstation
FINDINGS: Breast composition: c. Heterogeneous fibroglandular tissue.

Background parenchymal enhancement: Minimal

Right breast: No mass or abnormal enhancement.

Left breast: A tissue marker clip is identified in the UPPER central
portion of the LEFT breast, marking the site of known malignancy.
There has been complete resolution of mass and abnormal enhancement
in this region. No new sites of concern in the LEFT breast.

Lymph nodes: No abnormal appearing lymph nodes.

Ancillary findings:  None.
IMPRESSION: Interval resolution of mass and enhancement in the UPPER central
portion of the LEFT breast.

No new areas of concern.

RECOMMENDATION:
Treatment plan for known malignancy.

BI-RADS CATEGORY  6: Known biopsy-proven malignancy.

ADDENDUM:
The patient returned to the [REDACTED] [HOSPITAL] on
10/09/2018 for radioactive seed localization of the LEFT axillary
lymph node in anticipation of targeted node dissection. However, the
previously biopsied lymph node was not identified as all of the LEFT
axillary lymph nodes are normal in appearance on sonographic
evaluation. I was not able to reliably identify the lymph node with
the HydroMark tissue marker clip. Therefore, a radioactive seed was
not placed.

This was discussed directly by telephone with Dr. Hans on
10/09/2018 at 11 o'clock a.m.

*** End of Addendum ***
Three-dimensional MR images were rendered by post-processing of the
original MR data on an independent workstation. The
three-dimensional MR images were interpreted, and findings are
reported in the following complete MRI report for this study. Three
dimensional images were evaluated at the independent DynaCad
workstation
FINDINGS: Breast composition: c. Heterogeneous fibroglandular tissue.

Background parenchymal enhancement: Minimal

Right breast: No mass or abnormal enhancement.

Left breast: A tissue marker clip is identified in the UPPER central
portion of the LEFT breast, marking the site of known malignancy.
There has been complete resolution of mass and abnormal enhancement
in this region. No new sites of concern in the LEFT breast.

Lymph nodes: No abnormal appearing lymph nodes.

Ancillary findings:  None.
IMPRESSION: Interval resolution of mass and enhancement in the UPPER central
portion of the LEFT breast.

No new areas of concern.

RECOMMENDATION:
Treatment plan for known malignancy.

BI-RADS CATEGORY  6: Known biopsy-proven malignancy.

## 2019-10-18 ENCOUNTER — Ambulatory Visit: Payer: 59 | Attending: Internal Medicine

## 2019-10-18 DIAGNOSIS — Z23 Encounter for immunization: Secondary | ICD-10-CM

## 2019-10-18 NOTE — Progress Notes (Signed)
   Covid-19 Vaccination Clinic  Name:  Samantha Mcneil    MRN: [id number] DOB: 1986-01-30  10/18/2019  [name] was observed post Covid-19 immunization for 15 minutes without incident. She was provided with Vaccine Information Sheet and instruction to access the V-Safe system.   Ms. Money was instructed to call 911 with any severe reactions post vaccine: Marland Kitchen Difficulty breathing  . Swelling of face and throat  . A fast heartbeat  . A bad rash all over body  . Dizziness and weakness   Immunizations Administered    Name Date Dose VIS Date Route   Pfizer COVID-19 Vaccine 10/18/2019  8:44 AM 0.3 mL 08/25/2018 Intramuscular   Manufacturer: [place]   Lot: [id number]   [place]: [id number]

## 2019-10-29 ENCOUNTER — Other Ambulatory Visit: Payer: Self-pay

## 2019-11-01 ENCOUNTER — Encounter: Payer: Self-pay | Admitting: Obstetrics and Gynecology

## 2019-11-01 ENCOUNTER — Other Ambulatory Visit: Payer: Self-pay

## 2019-11-01 ENCOUNTER — Ambulatory Visit (INDEPENDENT_AMBULATORY_CARE_PROVIDER_SITE_OTHER): Payer: 59 | Admitting: Obstetrics and Gynecology

## 2019-11-01 VITALS — BP 116/74 | Ht 62.5 in | Wt 167.0 lb

## 2019-11-01 DIAGNOSIS — Z853 Personal history of malignant neoplasm of breast: Secondary | ICD-10-CM

## 2019-11-01 DIAGNOSIS — Z1509 Genetic susceptibility to other malignant neoplasm: Secondary | ICD-10-CM

## 2019-11-01 DIAGNOSIS — Z1501 Genetic susceptibility to malignant neoplasm of breast: Secondary | ICD-10-CM

## 2019-11-01 DIAGNOSIS — N9089 Other specified noninflammatory disorders of vulva and perineum: Secondary | ICD-10-CM | POA: Diagnosis not present

## 2019-11-01 DIAGNOSIS — Z01411 Encounter for gynecological examination (general) (routine) with abnormal findings: Secondary | ICD-10-CM | POA: Diagnosis not present

## 2019-11-01 DIAGNOSIS — Z3009 Encounter for other general counseling and advice on contraception: Secondary | ICD-10-CM | POA: Diagnosis not present

## 2019-11-01 DIAGNOSIS — N898 Other specified noninflammatory disorders of vagina: Secondary | ICD-10-CM

## 2019-11-01 LAB — WET PREP FOR TRICH, YEAST, CLUE

## 2019-11-01 MED ORDER — NYSTATIN-TRIAMCINOLONE 100000-0.1 UNIT/GM-% EX OINT: 1.0000 "application " | TOPICAL_OINTMENT | Freq: Two times a day (BID) | CUTANEOUS | 1 refills | Status: AC

## 2019-11-01 MED FILL — NYSTATIN-TRIAMCINOLONE OINT: 100000-0.1 | 7 days supply | Qty: 60 | Fill #0

## 2019-11-01 NOTE — Progress Notes (Signed)
Maryln Eastham 06-24-1986 [id number]  SUBJECTIVE:  34 y.o. G2P2 female for annual routine gynecologic exam.  She had a ParaGard IUD removed 03/2018.  She was diagnosed with breast cancer and had a bilateral mastectomy followed by chemotherapy and reconstructive surgery.  She is BRCA2 positive.  The breast cancer was ER/PR/HER-2 negative.  She has been followed by Dr. Lindi Adie.  She has seen Dr. Denman George regarding surveillance for ovarian cancer and she recommended prophylactic BSO by age 84. She was put on a combined OCP which she had been taking but had stopped it this month.  She initially was having hot flashes.  Chemotherapy stopped in about February 2020.  Hot flashes got better until stopping the birth control pill this month.  She is having some left axillary pain following the lymph node dissection, combined with the hot flashes, leading her to have trouble sleeping.  She also has been having irritation on her bottom with "blister-like" lesions which she has had in the past since the chemotherapy, she denies any itching or abnormal discharge.  She says she was given some sort of cream in the past which did help.  She does shave over the labial area, does not tolerate waxing.  Current Outpatient Medications  Medication Sig Dispense Refill  . norgestimate-ethinyl estradiol (ORTHO-CYCLEN, 28,) 0.25-35 MG-MCG tablet Take 1 tablet by mouth daily. 1 Package 11   No current facility-administered medications for this visit.   Allergies: Patient has no known allergies.  Patient's last menstrual period was 10/04/2019.  Past medical history,surgical history, problem list, medications, allergies, family history and social history were all reviewed and documented as reviewed in the EPIC chart.  ROS:  Feeling well. No dyspnea or chest pain on exertion.  No abdominal pain, change in bowel habits, black or bloody stools.  No urinary tract symptoms. GYN ROS: normal menses, no abnormal bleeding, pelvic  pain or discharge, no breast pain or new or enlarging lumps on self exam. No neurological complaints.   OBJECTIVE:  Ht 5' 2.5" (1.588 m)   Wt 167 lb (75.8 kg)   LMP 10/04/2019   BMI 30.06 kg/m  The patient appears well, alert, oriented x 3, in no distress. ENT normal.  Neck supple. No cervical or supraclavicular adenopathy or thyromegaly.  Lungs are clear, good air entry, no wheezes, rhonchi or rales. S1 and S2 normal, no murmurs, regular rate and rhythm.  Abdomen soft without tenderness, guarding, mass or organomegaly.  Neurological is normal, no focal findings.  BREAST EXAM: Previous bilateral mastectomy with reconstructions, breasts appear normal, no suspicious masses, no skin or nipple changes or axillary nodes  PELVIC EXAM: VULVA: Bilateral erythema over the labia majora with 3-4 cm glistening ulcerative patch bilaterally and symmetrically on the inner labia majora, otherwise normal appearing vulva with no masses, tenderness or lesions, VAGINA: normal appearing vagina with normal color and discharge, no lesions, CERVIX: normal appearing cervix without discharge or lesions, UTERUS: uterus is normal size, shape, consistency and nontender, ADNEXA: normal adnexa in size, nontender and no masses, RECTAL: normal rectal, no masses, WET MOUNT done - results: negative for pathogens, normal epithelial cells   Chaperone: [name] present during the examination  ASSESSMENT:  34 y.o. G2P2 here for annual gynecologic exam  PLAN:   1. Hot flashes. I suspect since this just started to recur coinciding with when she stopped taking the birth control pill that it may have to do with that alone.  I told her I would be careful of  taking hormones given the breast cancer history.  I did offer her a trial of Effexor but she declines.  2. Pap smear/HPV 2019.  I would recommend the next Pap smear in 2022 following the current guidelines recommending th 3 year cytology only interval. 3. Contraception.   Currently on oral contraceptive pills per Dr. Denman George.  I encouraged the patient to exercise caution with use of hormonal contraceptives in the history of breast cancer (even though the lesion was ER/PR negative).  She says she had cramping symptoms with the ParaGard IUD in the past, may be interested in trying a hormonal IUD, but before pursuing that I would seek the opinion of her oncology specialist, but I would still be wary of using hormonal contraception methods.  Permanent sterilization via salpingectomy would also be another option, but would also consider timing of when it would be best to perform BSO if she is certain about being finished with childbearing in that case. 4. BRCA 2 positive. History of left breast cancer (invasive ductal carcinoma). S/p bilateral mastectomy and reconstructions. Breast exam normal today.  Continue to follow-up with Dr. Lindi Adie for recommended surveillance.  Prophylactic bilateral salpingo-oophorectomy recommended by age 49 per Dr. Denman George.  Also recommended CA 125 and pelvic ultrasound every 6 months.  Baseline CA 125 elevation noted in 03/2019.  Will recheck CA 125 today and schedule ultrasound at check out.  5. Vulvar lesions.  Rx for Mycolog given.  I suspect this is a cutaneous side effect from her previous use of chemotherapy since that is about when she started to notice these lesions.  May need to consider biopsying the area if still peristent in the next month or two to rule out vulvar dysplasia. 6. Health maintenance.  No routine screening labs today (just the CA 125) as she normally has these completed with her primary care provider.    Additional time was spent in this encounter reviewing recent medical history and addressing multiple other concerns beyond the scope of a routine annual exam.  Return 1 month or sooner, prn.   Joseph Pierini MD 11/01/19

## 2019-11-01 NOTE — Patient Instructions (Signed)
Please schedule a pelvic ultrasound to screen the ovaries for any concerning changes

## 2019-11-02 LAB — CA 125: CA 125: 8 U/mL (ref <35)

## 2019-12-06 ENCOUNTER — Other Ambulatory Visit: Payer: Self-pay

## 2019-12-07 ENCOUNTER — Ambulatory Visit (INDEPENDENT_AMBULATORY_CARE_PROVIDER_SITE_OTHER): Payer: 59 | Admitting: Obstetrics and Gynecology

## 2019-12-07 ENCOUNTER — Ambulatory Visit (INDEPENDENT_AMBULATORY_CARE_PROVIDER_SITE_OTHER): Payer: 59

## 2019-12-07 ENCOUNTER — Encounter: Payer: Self-pay | Admitting: Obstetrics and Gynecology

## 2019-12-07 VITALS — BP 122/76

## 2019-12-07 DIAGNOSIS — Z1509 Genetic susceptibility to other malignant neoplasm: Secondary | ICD-10-CM

## 2019-12-07 DIAGNOSIS — Z853 Personal history of malignant neoplasm of breast: Secondary | ICD-10-CM

## 2019-12-07 DIAGNOSIS — Z1501 Genetic susceptibility to malignant neoplasm of breast: Secondary | ICD-10-CM

## 2019-12-07 DIAGNOSIS — N854 Malposition of uterus: Secondary | ICD-10-CM | POA: Diagnosis not present

## 2019-12-07 DIAGNOSIS — Z3009 Encounter for other general counseling and advice on contraception: Secondary | ICD-10-CM

## 2019-12-07 NOTE — Progress Notes (Signed)
   Samantha Mcneil 28-Feb-1986 [id number]  SUBJECTIVE:  34 y.o. G2P2 female presents for a pelvic ultrasound for ovarian surveillance.  CA 125 was normal on 11/01/2019.  She is BRCA2 positive with a history of a left breast cancer and is s/p bilateral mastectomy with reconstructions.  She says she did resume menstrual period a few weeks ago and overall has been feeling much better lately.  She did not end up using the Mycolog cream for the blistering lesions on her vulvar area but says that this has resolved.   Current Outpatient Medications  Medication Sig Dispense Refill  . norgestimate-ethinyl estradiol (ORTHO-CYCLEN, 28,) 0.25-35 MG-MCG tablet Take 1 tablet by mouth daily. 1 Package 11   No current facility-administered medications for this visit.   Allergies: Patient has no known allergies.  No LMP recorded.  Past medical history,surgical history, problem list, medications, allergies, family history and social history were all reviewed and documented as reviewed in the EPIC chart.    OBJECTIVE:  There were no vitals taken for this visit. The patient appears well, alert, oriented x 3, in no distress.  Pelvic ultrasound shows anteverted uterus, 7.9 x 4.3 x 4.0 cm Endometrial lining 4.3 mm. Bilateral ovaries normal, right ovary with 18 mm simple follicle. No adnexal masses.  No free fluid.   ASSESSMENT:  34 y.o. G2P2 with BRCA2 mutation and prior breast cancer history here for ovarian surveillance  PLAN:  The patient is reassured by the normal pelvic ultrasound with normal ovarian appearance today.  Recent CA-125 was normal.  She will continue surveillance with pelvic ultrasound every 6 months until she is ready to consider prophylactic BSO.  She says her next appointment is with Dr. Denman George in December so she will have that follow-up visit with her.   She has resumed menstruation after being amenorrheic following breast cancer treatments.  She does need contraception and  inquired about going back on the oral contraceptive pill.  I did tell her that I think nonhormonal options would be safest for her given her history, but I encouraged her to seek advice from Dr. Denman George about that and to see if she would endorse her going back on the combined OCPs previously prescribed her. We will see her back for her routine annual visit and ovarian surveillance next spring or sooner if any issues arise before then.  Joseph Pierini MD 12/07/19

## 2020-02-01 MED FILL — VYLIBRA 0.25-35 MG-MCG TABS: 0.25-35 | 28 days supply | Qty: 28 | Fill #7

## 2020-02-14 MED FILL — VYLIBRA 0.25-35 MG-MCG TABS: 0.25-35 | 28 days supply | Qty: 28 | Fill #7

## 2020-03-11 MED FILL — VYLIBRA 0.25-35 MG-MCG TABS: 0.25-35 | 28 days supply | Qty: 28 | Fill #8

## 2020-04-04 ENCOUNTER — Other Ambulatory Visit: Payer: Self-pay | Admitting: Gynecologic Oncology

## 2020-04-04 DIAGNOSIS — Z1501 Genetic susceptibility to malignant neoplasm of breast: Secondary | ICD-10-CM

## 2020-04-06 MED FILL — VYLIBRA 0.25-35 MG-MCG TABS: 0.25-35 | 28 days supply | Qty: 28 | Fill #0

## 2020-04-25 NOTE — Progress Notes (Signed)
Patient Care Team: Patient, [name] as PCP - General (General Practice) Dillingham, Samantha Lofty, DO as Attending Physician (Plastic Surgery) Samantha Klein, MD as Consulting Physician (General Surgery) Samantha Lose, MD as Consulting Physician (Hematology and Oncology)  DIAGNOSIS:    ICD-10-CM   1. Malignant neoplasm of upper-outer quadrant of left breast in female, estrogen receptor negative (Samantha Mcneil)  C50.412    Z17.1     SUMMARY OF ONCOLOGIC HISTORY: Oncology History  Malignant neoplasm of upper-outer quadrant of left breast in female, estrogen receptor negative ([name])  03/12/2018 Initial Diagnosis   Palpable masses in both breasts with family history of breast cancer, breast density category D, ultrasound measured these hypoechoic irregular mass left breast 2.3 cm, right breast no abnormality, biopsy left breast mass: IDC grade 3 ER 0%, PR 0%, HER-2 negative, Ki-67 90%, lymph node biopsy benign: T2N0   03/18/2018 Cancer Staging   Staging form: Breast, AJCC 8th Edition - Clinical: Stage IIB (cT2, cN0, cM0, G3, ER-, PR-, HER2-) - Signed by Samantha Lose, MD on 03/18/2018   03/30/2018 Breast MRI   Single biopsy-proven malignancy in the left 12 o'clock breast measures 2.3 x 1.3 x 1.5 cm.  Right breast without any malignancy detected.   04/02/2018 - 08/20/2018 Neo-Adjuvant Chemotherapy   Neoadjuvant chemotherapy with dose dense Adriamycin and Cytoxan followed by Taxol and carboplatin   04/23/2018 Genetic Testing   Positive genetic testing: A pathogenic variant was identified in BRCA2 called c.[id number] (p.Ala2951Glyfs*26) on the Common Hereditary Cancers Panel. The Common Hereditary Cancers Panel offered by [facility] includes sequencing and/or deletion duplication testing of the following 47 genes: APC, ATM, AXIN2, BARD1, BMPR1A, BRCA1, BRCA2, BRIP1, CDH1, CDKN2A (p14ARF), CDKN2A (p16INK4a), CKD4, CHEK2, CTNNA1, DICER1, EPCAM (Deletion/duplication testing only), GREM1 (promoter region  deletion/duplication testing only), KIT, MEN1, MLH1, MSH2, MSH3, MSH6, MUTYH, NBN, NF1, NHTL1, PALB2, PDGFRA, PMS2, POLD1, POLE, PTEN, RAD50, RAD51C, RAD51D, SDHB, SDHC, SDHD, SMAD4, SMARCA4. STK11, TP53, TSC1, TSC2, and VHL.  The following genes were evaluated for sequence changes only: SDHA and HOXB13 c.251G>A variant only. Genetic testing did detect a Variant of Unknown Significance (VUS) in the PALB2 gene called c.205C>T.  At this time, it is unknown if this variant is associated with increased cancer risk or if this is a normal finding, but most variants such as this get reclassified to being inconsequential. It should not be used to make medical management decisions.   The report date is 04/23/2018.    10/13/2018 Surgery   Bilateral mastectomies: right, benign; left, complete therapeutic response, no residual carcinoma, 3 SLN negative   10/15/2018 Cancer Staging   Staging form: Breast, AJCC 8th Edition - Pathologic: No Stage Recommended (ypT0, pN0, cM0) - Signed by [name], NP on 10/15/2018     CHIEF COMPLIANT: Surveillance of triple negative left breast cancer  INTERVAL HISTORY: Samantha Mcneil is a 34 y.o. with above-mentioned history of triple negative left breast cancer who completed neoadjuvant chemotherapy and underwent bilateral mastectomies with complete response to treatment. She presents today to the clinic today for follow-up.    ALLERGIES:  has No Known Allergies.  MEDICATIONS:  Current Outpatient Medications  Medication Sig Dispense Refill  . VYLIBRA 0.25-35 MG-MCG tablet TAKE 1 TABLET BY MOUTH DAILY. 28 tablet 11   No current facility-administered medications for this visit.    PHYSICAL EXAMINATION: ECOG PERFORMANCE STATUS: 1 - Symptomatic but completely ambulatory  There were no vitals filed for this visit. There were no vitals filed for this visit.  BREAST: No palpable masses or nodules in either right or left breasts. No palpable  axillary supraclavicular or infraclavicular adenopathy no breast tenderness or nipple discharge. (exam performed in the presence of a chaperone)  LABORATORY DATA:  I have reviewed the data as listed CMP Latest Ref Rng & Units 08/20/2018 08/13/2018 08/06/2018  Glucose 70 - 99 mg/dL 98 117(H) 91  BUN 6 - 20 mg/dL '8 12 12  ' Creatinine 0.44 - 1.00 mg/dL 0.66 0.66 0.63  Sodium 135 - 145 mmol/L 142 142 142  Potassium 3.5 - 5.1 mmol/L 3.7 4.1 4.1  Chloride 98 - 111 mmol/L 106 106 108  CO2 22 - 32 mmol/L '27 28 26  ' Calcium 8.9 - 10.3 mg/dL 9.4 9.7 9.5  Total Protein 6.5 - 8.1 g/dL 6.7 6.9 6.7  Total Bilirubin 0.3 - 1.2 mg/dL 0.3 0.3 0.4  Alkaline Phos 38 - 126 U/L 114 106 90  AST 15 - 41 U/L 18 41 21  ALT 0 - 44 U/L 33 62(H) 32    Lab Results  Component Value Date   WBC 6.7 04/26/2019   HGB 10.2 (L) 04/26/2019   HCT 33.3 (L) 04/26/2019   MCV 79.5 (L) 04/26/2019   PLT 355 04/26/2019   NEUTROABS 4.2 04/26/2019    ASSESSMENT & PLAN:  Malignant neoplasm of upper-outer quadrant of left breast in female, estrogen receptor negative ([name]) /05/2018:Palpable masses in both breasts with family history of breast cancer, breast density category D, ultrasound measured these hypoechoic irregular mass left breast 2.3 cm, right breast no abnormality, biopsy left breast mass: IDC grade 3 ER 0%, PR 0%, HER-2 negative, Ki-67 90%, lymph node biopsy benign: T2N0, stage IIb  Recommendationbased on multidisciplinary tumor board: Genetics consultation 1. Neoadjuvant chemotherapy with Adriamycin and Cytoxan dose dense 4 followed byTaxol with carboplatinweekly 12completed 08/20/2018 2. Followed bybilateral mastectomies with reconstructionwith targeted axillary dissection4/14/2020: Complete pathologic response, 0/3 lymph nodes negative  BRCA2 mutation:Bilateral mastectomies 10/13/2018 Oophorectomy after the age of  24 ---------------------------------------------------------------------------------------------------------------- Plan: surveillance. 1.  Breast examination: 04/25/2020: Bilateral reconstructed breasts without any lumps or nodules of concern 2.  No role of imaging studies because she had bilateral mastectomies.   Shingles on the abdominal wall: I sent a prescription for Valtrex thousand p.o. twice daily for 7 days.  She plans to go to her home country [place] and have more plastic surgery done including liposuction. Return to clinic in 1 year for surveillance and follow-up    No orders of the defined types were placed in this encounter.  The patient has a good understanding of the overall plan. she agrees with it. she will call with any problems that may develop before the next visit here.  Total time spent: 20 mins including face to face time and time spent for planning, charting and coordination of care  Samantha Lose, MD 04/26/2020  I, Cloyde Reams Dorshimer, am acting as scribe for Dr. Nicholas Mcneil.  I have reviewed the above documentation for accuracy and completeness, and I agree with the above.

## 2020-04-25 NOTE — Assessment & Plan Note (Signed)
/  05/2018:Palpable masses in both breasts with family history of breast cancer, breast density category D, ultrasound measured these hypoechoic irregular mass left breast 2.3 cm, right breast no abnormality, biopsy left breast mass: IDC grade 3 ER 0%, PR 0%, HER-2 negative, Ki-67 90%, lymph node biopsy benign: T2N0, stage IIb  Recommendationbased on multidisciplinary tumor board: Genetics consultation 1. Neoadjuvant chemotherapy with Adriamycin and Cytoxan dose dense 4 followed byTaxol with carboplatinweekly 12completed 08/20/2018 2. Followed bybilateral mastectomies with reconstructionwith targeted axillary dissection4/14/2020: Complete pathologic response, 0/3 lymph nodes negative  BRCA2 mutation:Bilateral mastectomies 10/13/2018 Oophorectomy after the age of 15 ---------------------------------------------------------------------------------------------------------------- Plan: surveillance. 1.  Breast examination: 04/25/2020: Bilateral reconstructed breasts without any lumps or nodules of concern 2.  No role of imaging studies because she had bilateral mastectomies. Depression: Tapered and discontinued Effexor  Return to clinic in 1 year for surveillance and follow-up

## 2020-04-26 ENCOUNTER — Other Ambulatory Visit: Payer: Self-pay

## 2020-04-26 ENCOUNTER — Other Ambulatory Visit: Payer: Self-pay | Admitting: Hematology and Oncology

## 2020-04-26 ENCOUNTER — Inpatient Hospital Stay: Payer: 59 | Attending: Hematology and Oncology | Admitting: Hematology and Oncology

## 2020-04-26 DIAGNOSIS — Z803 Family history of malignant neoplasm of breast: Secondary | ICD-10-CM | POA: Diagnosis not present

## 2020-04-26 DIAGNOSIS — Z23 Encounter for immunization: Secondary | ICD-10-CM | POA: Insufficient documentation

## 2020-04-26 DIAGNOSIS — Z79899 Other long term (current) drug therapy: Secondary | ICD-10-CM | POA: Insufficient documentation

## 2020-04-26 DIAGNOSIS — C50412 Malignant neoplasm of upper-outer quadrant of left female breast: Secondary | ICD-10-CM | POA: Insufficient documentation

## 2020-04-26 DIAGNOSIS — F329 Major depressive disorder, single episode, unspecified: Secondary | ICD-10-CM | POA: Insufficient documentation

## 2020-04-26 DIAGNOSIS — Z171 Estrogen receptor negative status [ER-]: Secondary | ICD-10-CM | POA: Diagnosis not present

## 2020-04-26 MED ORDER — INFLUENZA VAC SPLIT QUAD 0.5 ML IM SUSY
PREFILLED_SYRINGE | INTRAMUSCULAR | Status: AC
  Filled 2020-04-26: qty 0.5

## 2020-04-26 MED ORDER — INFLUENZA VAC SPLIT QUAD 0.5 ML IM SUSY
0.5000 mL | PREFILLED_SYRINGE | Freq: Once | INTRAMUSCULAR | Status: AC
  Administered 2020-04-26: 0.5 mL via INTRAMUSCULAR

## 2020-04-26 MED ORDER — VALACYCLOVIR HCL 1 G PO TABS
1000.0000 mg | ORAL_TABLET | Freq: Two times a day (BID) | ORAL | 0 refills | Status: DC
Start: 2020-04-26 — End: 2020-04-26

## 2020-04-26 MED FILL — valACYclovir HCL 1 GM TABS: 1 | 7 days supply | Qty: 14 | Fill #0

## 2020-07-01 NOTE — L&D Delivery Note (Signed)
Alerted while I was in another delivery that this baby had delivered in the bed spontaneously.  Per RN, went to check patient when she said she felt "something".  When they opened her legs to examine, baby delivered spontaneously followed by placental delivery without traction on cord.  Patient was     NSVD  Inconclusive gendered infant, Apgars 0/0, weight pending.   The patient had no laceration. Fundus was firm. EBL was expected amount. Placenta was delivered intact. Vagina was clear.  Fetal demise was moved to warmer for examination. Anora specimen collected and paperwork signed.  Placenta to pathology for evaluation.  Pt declines autopsy.  Allyn Kenner

## 2020-08-22 ENCOUNTER — Telehealth: Payer: Self-pay | Admitting: Hematology and Oncology

## 2020-08-22 NOTE — Telephone Encounter (Signed)
Patient informed me that she went to [place] and preoperative checkup revealed that she was pregnant at [redacted] weeks.  She saw her gynecologist locally who told her that the baby looks fine. She is of course quite surprised and shocked. She is coming back in a couple of weeks and will see her gynecologist here for a checkup.

## 2020-09-23 ENCOUNTER — Other Ambulatory Visit: Payer: Self-pay

## 2020-09-23 ENCOUNTER — Inpatient Hospital Stay (HOSPITAL_COMMUNITY): Payer: 59

## 2020-09-23 ENCOUNTER — Inpatient Hospital Stay (HOSPITAL_COMMUNITY)
Admission: AD | Admit: 2020-09-23 | Discharge: 2020-09-23 | Disposition: A | Discharge status: Home / Self Care | Payer: 59 | Attending: Obstetrics and Gynecology | Admitting: Obstetrics and Gynecology

## 2020-09-23 ENCOUNTER — Encounter (HOSPITAL_COMMUNITY): Payer: Self-pay | Admitting: Obstetrics and Gynecology

## 2020-09-23 DIAGNOSIS — O209 Hemorrhage in early pregnancy, unspecified: Secondary | ICD-10-CM | POA: Insufficient documentation

## 2020-09-23 DIAGNOSIS — Z3A13 13 weeks gestation of pregnancy: Secondary | ICD-10-CM | POA: Diagnosis not present

## 2020-09-23 DIAGNOSIS — O26891 Other specified pregnancy related conditions, first trimester: Secondary | ICD-10-CM | POA: Insufficient documentation

## 2020-09-23 DIAGNOSIS — R109 Unspecified abdominal pain: Secondary | ICD-10-CM | POA: Insufficient documentation

## 2020-09-23 DIAGNOSIS — O26899 Other specified pregnancy related conditions, unspecified trimester: Secondary | ICD-10-CM

## 2020-09-23 DIAGNOSIS — O4691 Antepartum hemorrhage, unspecified, first trimester: Secondary | ICD-10-CM

## 2020-09-23 DIAGNOSIS — O4690 Antepartum hemorrhage, unspecified, unspecified trimester: Secondary | ICD-10-CM

## 2020-09-23 LAB — CBC WITH DIFFERENTIAL/PLATELET
Abs Immature Granulocytes: 0.05 10*3/uL (ref 0.00–0.07)
Basophils Absolute: 0 10*3/uL (ref 0.0–0.1)
Basophils Relative: 0 %
Eosinophils Absolute: 0.1 10*3/uL (ref 0.0–0.5)
Eosinophils Relative: 1 %
HCT: 34.6 % — ABNORMAL LOW (ref 36.0–46.0)
Hemoglobin: 12 g/dL (ref 12.0–15.0)
Immature Granulocytes: 1 %
Lymphocytes Relative: 27 %
Lymphs Abs: 2.4 10*3/uL (ref 0.7–4.0)
MCH: 30.3 pg (ref 26.0–34.0)
MCHC: 34.7 g/dL (ref 30.0–36.0)
MCV: 87.4 fL (ref 80.0–100.0)
Monocytes Absolute: 0.8 10*3/uL (ref 0.1–1.0)
Monocytes Relative: 9 %
Neutro Abs: 5.6 10*3/uL (ref 1.7–7.7)
Neutrophils Relative %: 62 %
Platelets: 321 10*3/uL (ref 150–400)
RBC: 3.96 MIL/uL (ref 3.87–5.11)
RDW: 13 % (ref 11.5–15.5)
WBC: 8.9 10*3/uL (ref 4.0–10.5)
nRBC: 0 % (ref 0.0–0.2)

## 2020-09-23 LAB — POCT PREGNANCY, URINE: Preg Test, Ur: POSITIVE — AB

## 2020-09-23 LAB — WET PREP, GENITAL
Clue Cells Wet Prep HPF POC: NONE SEEN
Sperm: NONE SEEN
Trich, Wet Prep: NONE SEEN
Yeast Wet Prep HPF POC: NONE SEEN

## 2020-09-23 LAB — COMPREHENSIVE METABOLIC PANEL
ALT: 13 U/L (ref 0–44)
AST: 16 U/L (ref 15–41)
Albumin: 3.3 g/dL — ABNORMAL LOW (ref 3.5–5.0)
Alkaline Phosphatase: 54 U/L (ref 38–126)
Anion gap: 6 (ref 5–15)
BUN: 6 mg/dL (ref 6–20)
CO2: 27 mmol/L (ref 22–32)
Calcium: 9.2 mg/dL (ref 8.9–10.3)
Chloride: 101 mmol/L (ref 98–111)
Creatinine, Ser: 0.62 mg/dL (ref 0.44–1.00)
GFR, Estimated: >60 mL/min (ref >60)
Glucose, Bld: 104 mg/dL — ABNORMAL HIGH (ref 70–99)
Potassium: 3.8 mmol/L (ref 3.5–5.1)
Sodium: 134 mmol/L — ABNORMAL LOW (ref 135–145)
Total Bilirubin: 0.3 mg/dL (ref 0.3–1.2)
Total Protein: 7 g/dL (ref 6.5–8.1)

## 2020-09-23 LAB — HIV ANTIBODY (ROUTINE TESTING W REFLEX): HIV Screen 4th Generation wRfx: NONREACTIVE

## 2020-09-23 LAB — ABO/RH: ABO/RH(D): A POS

## 2020-09-23 LAB — HCG, QUANTITATIVE, PREGNANCY: hCG, Beta Chain, Quant, S: 77536 m[IU]/mL — ABNORMAL HIGH (ref <5)

## 2020-09-23 NOTE — Discharge Instructions (Signed)
Vaginal Bleeding During Pregnancy, First Trimester A small amount of bleeding from the vagina is common during early pregnancy. This kind of bleeding is also called spotting. Sometimes the bleeding is normal and does not cause problems. At other times, though, bleeding may be a sign of something serious. Normal bleeding in pregnancy can happen:  When the fertilized egg attaches itself to your womb.  When blood vessels change because of the pregnancy.  When you have pelvic exams.  When you have sex. Abnormal bleeding can happen:  When you have an infection.  When you have growths in your womb. The growths are called polyps.  If you are having a miscarriage or at risk of having one.  If you have other problems in your pregnancy. Tell your doctor right away about any bleeding from your vagina. Follow these instructions at home: Watch your bleeding  Watch your condition for any changes. Let your doctor know if you are worried about something.  Try to know what causes your bleeding. Ask yourself these questions: ? Does the bleeding start on its own? ? Does the bleeding start after something is done, such as sex or a pelvic exam?  Use a diary to write the things you see about your bleeding. Write in your diary: ? If the bleeding flows freely without stopping, or if it starts and stops, and then starts again. ? If the bleeding is heavy or light. ? How many pads you use in a day and how much blood is in them.  Tell your doctor if you pass tissue. He or she may want to see it.   Activity  Follow your doctor's instructions about how active you can be. Ask what activities are safe for you.  Do not have sex or orgasms until your doctor says that this is safe.  If needed, make plans for someone to help with your normal activities. General instructions  Take over-the-counter and prescription medicines only as told by your doctor.  Do not take aspirin because it can cause  bleeding.  Do not use tampons.  Do not douche.  Keep all follow-up visits. Contact a doctor if:  You have vaginal bleeding at any time while you are pregnant.  You have cramps.  You have a fever or chills. Get help right away if:  You have very bad cramps in your back or belly (abdomen).  You pass large clots or a lot of tissue from your vagina.  Your bleeding gets worse.  You feel light-headed.  You feel weak.  You pass out (faint).  You have chills.  You are leaking fluid from your vagina.  You have a gush of fluid from your vagina. Summary  Sometimes vaginal bleeding during pregnancy is normal and does not cause problems. At other times, bleeding may be a sign of something serious.  Tell your doctor right away about any bleeding from your vagina.  Follow your doctor's instructions about how active you can be. You may need someone to help you with your normal activities.  Keep all follow-up visits. This information is not intended to replace advice given to you by your health care provider. Make sure you discuss any questions you have with your health care provider. Document Revised: 03/09/2020 Document Reviewed: 03/09/2020 Elsevier Patient Education  2021 [employer]vier Inc.  

## 2020-09-23 NOTE — MAU Note (Signed)
Pt reports that she was trying to get plastic surgery and they did an ultrasound and told ehr that she was pregnant on February 14. Pt reports the next week she went to her gynecologist to confirm.  Pt reports that this was in the [place]   Pt reports vaginal bleeding that started last night.   Pt reports lower abdominal pain that also started yesterday.

## 2020-09-23 NOTE — MAU Provider Note (Signed)
History     CSN: [id number]  Arrival date and time: 09/23/20 2011   Event Date/Time   First Provider Initiated Contact with Patient 09/23/20 2103      Chief Complaint  Patient presents with  . Abdominal Pain  . Vaginal Bleeding   HPI   Samantha Mcneil is a 35 y.o. female G3P2 @ [redacted]w[redacted]d here with abdominal pain and vaginal bleeding. The pain started yesterday and the bleeding started today. The bleeding that she is seeing is brown in color. The bleeding is not heavy enough that she has to wear a pad. The pain is located in her lower abdomen. The pain is described as "heavy, pain, pressure". The pain comes and goes. She has not taken anything for the pain.   OB History    Gravida  3   Para  2   Term      Preterm      AB      Living  2     SAB      IAB      Ectopic      Multiple      Live Births  2           Past Medical History:  Diagnosis Date  . Anxiety   . Cancer [facility])    Breast cancer  . Depression   . Headache    migraines  . History of kidney stones     Past Surgical History:  Procedure Laterality Date  . BREAST RECONSTRUCTION WITH PLACEMENT OF TISSUE EXPANDER AND FLEX HD (ACELLULAR HYDRATED DERMIS) Bilateral 10/13/2018   Procedure: BILATERAL IMMEDIATE BREAST RECONSTRUCTION WITH PLACEMENT OF TISSUE EXPANDER AND FLEX HD (ACELLULAR HYDRATED DERMIS);  Surgeon: Wallace Going, DO;  Location: [place];  Service: Plastics;  Laterality: Bilateral;  . CESAREAN SECTION    . MASTECTOMY W/ SENTINEL NODE BIOPSY Bilateral 10/13/2018   Procedure: BILATERAL SKIN SPARING MASTECTOMIES WITH LEFT SENTINEL  LYMPH NODE BIOPSY.;  Surgeon: [name], MD;  Location: [place];  Service: General;  Laterality: Bilateral;  . OTHER SURGICAL HISTORY    . PORT-A-CATH REMOVAL N/A 01/13/2019   Procedure: REMOVAL PORT-A-CATH;  Surgeon: Wallace Going, DO;  Location: [place];  Service: Plastics;   Laterality: N/A;  total case time is 2 hours  . PORTACATH PLACEMENT Left 04/01/2018   Procedure: INSERTION PORT-A-CATH;  Surgeon: [name], MD;  Location: [place];  Service: General;  Laterality: Left;  . REMOVAL OF BILATERAL TISSUE EXPANDERS WITH PLACEMENT OF BILATERAL BREAST IMPLANTS Bilateral 01/13/2019   Procedure: REMOVAL OF BILATERAL TISSUE EXPANDERS WITH PLACEMENT OF BILATERAL BREAST IMPLANTS;  Surgeon: Wallace Going, DO;  Location: [place];  Service: Plastics;  Laterality: Bilateral;    Family History  Problem Relation Age of Onset  . Heart disease Father        d. 45  . Breast cancer Maternal Aunt        d. 76  . Breast cancer Cousin        dx 64  . Liver cancer Maternal Grandmother        d. 71  . Lung cancer Maternal Grandfather        d. 69  . Breast cancer Cousin        dx 24s    Social History   Tobacco Use  . Smoking status: Never Smoker  . Smokeless tobacco: Never Used  Vaping Use  .  Vaping Use: Never used  Substance Use Topics  . Alcohol use: Yes    Comment: Rare  . Drug use: Never    Allergies: No Known Allergies  Medications Prior to Admission  Medication Sig Dispense Refill Last Dose  . valACYclovir (VALTREX) 1000 MG tablet Take 1 tablet (1,000 mg total) by mouth 2 (two) times daily. 14 tablet 0    Results for orders placed or performed during the hospital encounter of 09/23/20 (from the past 48 hour(s))  Pregnancy, urine POC     Status: Abnormal   Collection Time: 09/23/20  8:39 PM  Result Value Ref Range   Preg Test, Ur POSITIVE (A) NEGATIVE    Comment:        THE SENSITIVITY OF THIS METHODOLOGY IS >24 mIU/mL   Wet prep, genital     Status: Abnormal   Collection Time: 09/23/20  9:15 PM   Specimen: Vaginal  Result Value Ref Range   Yeast Wet Prep HPF POC NONE SEEN NONE SEEN   Trich, Wet Prep NONE SEEN NONE SEEN   Clue Cells Wet Prep HPF POC NONE SEEN NONE SEEN   WBC, Wet Prep HPF POC MANY (A)  NONE SEEN   Sperm NONE SEEN     Comment: Performed at [facility], [address] [place], [place] [address]  CBC with Differential/Platelet     Status: Abnormal   Collection Time: 09/23/20  9:17 PM  Result Value Ref Range   WBC 8.9 4.0 - 10.5 K/uL   RBC 3.96 3.87 - 5.11 MIL/uL   Hemoglobin 12.0 12.0 - 15.0 g/dL   HCT 34.6 (L) 36.0 - 46.0 %   MCV 87.4 80.0 - 100.0 fL   MCH 30.3 26.0 - 34.0 pg   MCHC 34.7 30.0 - 36.0 g/dL   RDW 13.0 11.5 - 15.5 %   Platelets 321 150 - 400 K/uL   nRBC 0.0 0.0 - 0.2 %   Neutrophils Relative % 62 %   Neutro Abs 5.6 1.7 - 7.7 K/uL   Lymphocytes Relative 27 %   Lymphs Abs 2.4 0.7 - 4.0 K/uL   Monocytes Relative 9 %   Monocytes Absolute 0.8 0.1 - 1.0 K/uL   Eosinophils Relative 1 %   Eosinophils Absolute 0.1 0.0 - 0.5 K/uL   Basophils Relative 0 %   Basophils Absolute 0.0 0.0 - 0.1 K/uL   Immature Granulocytes 1 %   Abs Immature Granulocytes 0.05 0.00 - 0.07 K/uL    Comment: Performed at [facility] [address] [place],  [address]  Comprehensive metabolic panel     Status: Abnormal   Collection Time: 09/23/20  9:17 PM  Result Value Ref Range   Sodium 134 (L) 135 - 145 mmol/L   Potassium 3.8 3.5 - 5.1 mmol/L   Chloride 101 98 - 111 mmol/L   CO2 27 22 - 32 mmol/L   Glucose, Bld 104 (H) 70 - 99 mg/dL    Comment: Glucose reference range applies only to samples taken after fasting for at least 8 hours.   BUN 6 6 - 20 mg/dL   Creatinine, Ser 0.62 0.44 - 1.00 mg/dL   Calcium 9.2 8.9 - 10.3 mg/dL   Total Protein 7.0 6.5 - 8.1 g/dL   Albumin 3.3 (L) 3.5 - 5.0 g/dL   AST 16 15 - 41 U/L   ALT 13 0 - 44 U/L   Alkaline Phosphatase 54 38 - 126 U/L   Total Bilirubin 0.3  0.3 - 1.2 mg/dL   GFR, Estimated >60 >60 mL/min    Comment: (NOTE) Calculated using the CKD-EPI Creatinine Equation (2021)    Anion gap 6 5 - 15    Comment: Performed at [facility] [address] [place], [place] [address]  ABO/Rh     Status: None    Collection Time: 09/23/20  9:17 PM  Result Value Ref Range   ABO/RH(D) A POS    No rh immune globuloin      NOT A RH IMMUNE GLOBULIN CANDIDATE, PT RH POSITIVE Performed at [facility] [address], [place], [place] [address]   hCG, quantitative, pregnancy     Status: Abnormal   Collection Time: 09/23/20  9:17 PM  Result Value Ref Range   hCG, Beta Chain, Quant, S 77,536 (H) <5 mIU/mL    Comment:          GEST. AGE      CONC.  (mIU/mL)   <=1 WEEK        5 - 50     2 WEEKS       50 - 500     3 WEEKS       100 - 10,000     4 WEEKS     1,000 - 30,000     5 WEEKS     3,500 - 115,000   6-8 WEEKS     12,000 - 270,000    12 WEEKS     15,000 - 220,000        FEMALE AND NON-PREGNANT FEMALE:     LESS THAN 5 mIU/mL Performed at [facility], [place] [address], [place], [place] [address]    [place] Comp Less 14 Wks  Result Date: 09/23/2020 CLINICAL DATA:  Cramping, bleeding EXAM: OBSTETRIC <14 WK ULTRASOUND TECHNIQUE: Transabdominal ultrasound was performed for evaluation of the gestation as well as the maternal uterus and adnexal regions. COMPARISON:  None. FINDINGS: Intrauterine gestational sac: Single Yolk sac:  Not Visualized. Embryo:  Visualized. Cardiac Activity: Visualized. Heart Rate: 154 bpm CRL: 60.3 mm   13 w 1 d                  [place] EDC: 03/30/2021 Subchorionic hemorrhage:  None visualized. Maternal uterus/adnexae: Left ovary is not well visualized. Right ovary measures 4.1 x 2.6 x 3.0 cm with persistent corpus luteum cyst. No free fluid. IMPRESSION: 1. Single live intrauterine pregnancy as above, estimated age 46 weeks and 1 day. Electronically Signed   By: Randa Ngo M.D.   On: 09/23/2020 22:15   Review of Systems  Constitutional: Negative for fever.  Gastrointestinal: Positive for abdominal pain and nausea. Negative for vomiting.  Genitourinary: Positive for vaginal bleeding.   Physical Exam   Blood pressure 120/73, pulse (!) 58, temperature 98.4 F (36.9 C), resp.  rate 20, weight 71.3 kg, last menstrual period 07/01/2020, SpO2 100 %.  Physical Exam Constitutional:      Appearance: She is well-developed and normal weight.  HENT:     Head: Normocephalic.  Abdominal:     Tenderness: There is abdominal tenderness in the suprapubic area. There is no guarding or rebound.  Genitourinary:    Comments: Vagina - Small amount of pink/brown vaginal discharge, no odor  Cervix - No contact bleeding, no active bleeding  Bimanual exam: deferred  GC/Chlam, wet prep done Chaperone present for exam.  Skin:    General: Skin is warm.  Neurological:  Mental Status: She is alert.     MAU Course  Procedures  None  MDM  A positive blood type  RN unable to doppler fetal hear tones Wet prep & GC HIV, CBC, Hcg, ABO US OB transvaginal   Assessment and Plan   A:  1. Vaginal bleeding in pregnancy   2. Abdominal cramping affecting pregnancy   3. [redacted] weeks gestation of pregnancy     P:  Discharge home in stable condition  Start prenatal care Pelvic rest Return to MAU if symptoms worsen  Ahmari Duerson, Artist Pais, NP 09/23/2020 11:20 PM

## 2020-09-25 ENCOUNTER — Encounter: Payer: Self-pay | Admitting: Obstetrics and Gynecology

## 2020-09-25 LAB — GC/CHLAMYDIA PROBE AMP (~~LOC~~) NOT AT ARMC
Chlamydia: NEGATIVE
Comment: NEGATIVE
Comment: NORMAL
Neisseria Gonorrhea: NEGATIVE

## 2020-09-30 ENCOUNTER — Other Ambulatory Visit: Payer: Self-pay | Admitting: Obstetrics and Gynecology

## 2020-10-02 ENCOUNTER — Other Ambulatory Visit (HOSPITAL_COMMUNITY): Payer: Self-pay

## 2020-10-03 ENCOUNTER — Other Ambulatory Visit (HOSPITAL_COMMUNITY): Payer: Self-pay

## 2020-10-03 MED ORDER — TRIAMCINOLONE ACETONIDE 0.1 % EX CREA
TOPICAL_CREAM | CUTANEOUS | 1 refills | Status: DC
  Filled 2020-10-03: qty 30, 15d supply, fill #0

## 2020-10-12 ENCOUNTER — Other Ambulatory Visit: Payer: Self-pay | Admitting: Obstetrics and Gynecology

## 2020-10-25 ENCOUNTER — Other Ambulatory Visit: Payer: Self-pay | Admitting: Obstetrics and Gynecology

## 2020-10-25 DIAGNOSIS — Z363 Encounter for antenatal screening for malformations: Secondary | ICD-10-CM

## 2020-10-25 DIAGNOSIS — Z853 Personal history of malignant neoplasm of breast: Secondary | ICD-10-CM

## 2020-10-25 DIAGNOSIS — Z3A19 19 weeks gestation of pregnancy: Secondary | ICD-10-CM

## 2020-11-09 ENCOUNTER — Other Ambulatory Visit: Payer: Self-pay

## 2020-11-09 ENCOUNTER — Ambulatory Visit: Payer: 59 | Admitting: *Deleted

## 2020-11-09 ENCOUNTER — Encounter: Payer: Self-pay | Admitting: *Deleted

## 2020-11-09 ENCOUNTER — Ambulatory Visit: Payer: 59 | Attending: Obstetrics and Gynecology

## 2020-11-09 VITALS — BP 111/73 | HR 77

## 2020-11-09 DIAGNOSIS — O321XX0 Maternal care for breech presentation, not applicable or unspecified: Secondary | ICD-10-CM | POA: Diagnosis not present

## 2020-11-09 DIAGNOSIS — Z853 Personal history of malignant neoplasm of breast: Secondary | ICD-10-CM | POA: Diagnosis present

## 2020-11-09 DIAGNOSIS — O365920 Maternal care for other known or suspected poor fetal growth, second trimester, not applicable or unspecified: Secondary | ICD-10-CM | POA: Diagnosis not present

## 2020-11-09 DIAGNOSIS — Z3A19 19 weeks gestation of pregnancy: Secondary | ICD-10-CM | POA: Insufficient documentation

## 2020-11-09 DIAGNOSIS — Z363 Encounter for antenatal screening for malformations: Secondary | ICD-10-CM | POA: Insufficient documentation

## 2020-11-10 ENCOUNTER — Other Ambulatory Visit: Payer: Self-pay | Admitting: *Deleted

## 2020-11-10 DIAGNOSIS — O365990 Maternal care for other known or suspected poor fetal growth, unspecified trimester, not applicable or unspecified: Secondary | ICD-10-CM

## 2020-11-21 ENCOUNTER — Encounter: Payer: Self-pay | Admitting: Hematology and Oncology

## 2020-11-21 ENCOUNTER — Encounter (HOSPITAL_COMMUNITY): Payer: Self-pay | Admitting: Obstetrics and Gynecology

## 2020-11-21 ENCOUNTER — Inpatient Hospital Stay (HOSPITAL_COMMUNITY)
Admission: AD | Admit: 2020-11-21 | Discharge: 2020-11-23 | DRG: 807 | Disposition: A | Discharge status: Home / Self Care | Payer: 59 | Attending: Obstetrics and Gynecology | Admitting: Obstetrics and Gynecology

## 2020-11-21 ENCOUNTER — Encounter (HOSPITAL_COMMUNITY): Payer: Self-pay | Admitting: Anesthesiology

## 2020-11-21 DIAGNOSIS — Z3A21 21 weeks gestation of pregnancy: Secondary | ICD-10-CM | POA: Diagnosis not present

## 2020-11-21 DIAGNOSIS — Z20822 Contact with and (suspected) exposure to covid-19: Secondary | ICD-10-CM | POA: Diagnosis present

## 2020-11-21 DIAGNOSIS — Z800 Family history of malignant neoplasm of digestive organs: Secondary | ICD-10-CM

## 2020-11-21 DIAGNOSIS — Z853 Personal history of malignant neoplasm of breast: Secondary | ICD-10-CM | POA: Diagnosis not present

## 2020-11-21 DIAGNOSIS — O365920 Maternal care for other known or suspected poor fetal growth, second trimester, not applicable or unspecified: Secondary | ICD-10-CM | POA: Diagnosis present

## 2020-11-21 DIAGNOSIS — O364XX0 Maternal care for intrauterine death, not applicable or unspecified: Principal | ICD-10-CM | POA: Diagnosis present

## 2020-11-21 DIAGNOSIS — Z801 Family history of malignant neoplasm of trachea, bronchus and lung: Secondary | ICD-10-CM | POA: Diagnosis not present

## 2020-11-21 LAB — CBC
HCT: 34.1 % — ABNORMAL LOW (ref 36.0–46.0)
Hemoglobin: 11.4 g/dL — ABNORMAL LOW (ref 12.0–15.0)
MCH: 29.9 pg (ref 26.0–34.0)
MCHC: 33.4 g/dL (ref 30.0–36.0)
MCV: 89.5 fL (ref 80.0–100.0)
Platelets: 329 10*3/uL (ref 150–400)
RBC: 3.81 MIL/uL — ABNORMAL LOW (ref 3.87–5.11)
RDW: 12.4 % (ref 11.5–15.5)
WBC: 8.3 10*3/uL (ref 4.0–10.5)
nRBC: 0.2 % (ref 0.0–0.2)

## 2020-11-21 LAB — TYPE AND SCREEN
ABO/RH(D): A POS
Antibody Screen: NEGATIVE

## 2020-11-21 LAB — RESP PANEL BY RT-PCR (FLU A&B, COVID) ARPGX2
Influenza A by PCR: NEGATIVE
Influenza B by PCR: NEGATIVE
SARS Coronavirus 2 by RT PCR: NEGATIVE

## 2020-11-21 MED ORDER — OXYTOCIN-SODIUM CHLORIDE 30-0.9 UT/500ML-% IV SOLN
2.5000 [IU]/h | INTRAVENOUS | Status: DC
  Administered 2020-11-23: 2.5 [IU]/h via INTRAVENOUS
  Filled 2020-11-21: qty 500

## 2020-11-21 MED ORDER — PHENYLEPHRINE 40 MCG/ML (10ML) SYRINGE FOR IV PUSH (FOR BLOOD PRESSURE SUPPORT): 80.0000 ug | PREFILLED_SYRINGE | INTRAVENOUS | Status: DC | PRN

## 2020-11-21 MED ORDER — LIDOCAINE HCL (PF) 1 % IJ SOLN: 30.0000 mL | INTRAMUSCULAR | Status: DC | PRN

## 2020-11-21 MED ORDER — DIPHENHYDRAMINE HCL 50 MG/ML IJ SOLN: 12.5000 mg | INTRAMUSCULAR | Status: DC | PRN

## 2020-11-21 MED ORDER — EPHEDRINE 5 MG/ML INJ: 10.0000 mg | INTRAVENOUS | Status: DC | PRN

## 2020-11-21 MED ORDER — LACTATED RINGERS IV SOLN: INTRAVENOUS | Status: DC

## 2020-11-21 MED ORDER — FENTANYL-BUPIVACAINE-NACL 0.5-0.125-0.9 MG/250ML-% EP SOLN
12.0000 mL/h | EPIDURAL | Status: DC | PRN
  Administered 2020-11-22: 12 mL/h via EPIDURAL
  Filled 2020-11-21 (×2): qty 250

## 2020-11-21 MED ORDER — ONDANSETRON HCL 4 MG/2ML IJ SOLN: 4.0000 mg | Freq: Four times a day (QID) | INTRAMUSCULAR | Status: DC | PRN

## 2020-11-21 MED ORDER — OXYTOCIN BOLUS FROM INFUSION
333.0000 mL | Freq: Once | INTRAVENOUS | Status: AC
  Administered 2020-11-23: 333 mL via INTRAVENOUS

## 2020-11-21 MED ORDER — IBUPROFEN 600 MG PO TABS
600.0000 mg | ORAL_TABLET | Freq: Once | ORAL | Status: AC
  Administered 2020-11-21: 600 mg via ORAL
  Filled 2020-11-21: qty 1

## 2020-11-21 MED ORDER — MISOPROSTOL 50MCG HALF TABLET
ORAL_TABLET | ORAL | Status: AC
  Administered 2020-11-23: 200 ug via VAGINAL
  Filled 2020-11-21: qty 1

## 2020-11-21 MED ORDER — ACETAMINOPHEN 325 MG PO TABS
650.0000 mg | ORAL_TABLET | ORAL | Status: DC | PRN
  Administered 2020-11-21 – 2020-11-23 (×4): 650 mg via ORAL
  Filled 2020-11-21 (×4): qty 2

## 2020-11-21 MED ORDER — MISOPROSTOL 50MCG HALF TABLET
50.0000 ug | ORAL_TABLET | Freq: Once | ORAL | Status: AC
  Administered 2020-11-21: 50 ug via BUCCAL

## 2020-11-21 MED ORDER — LACTATED RINGERS IV SOLN: 500.0000 mL | INTRAVENOUS | Status: DC | PRN

## 2020-11-21 MED ORDER — LACTATED RINGERS IV SOLN
500.0000 mL | Freq: Once | INTRAVENOUS | Status: AC
  Administered 2020-11-22: 500 mL via INTRAVENOUS

## 2020-11-21 MED ORDER — OXYCODONE-ACETAMINOPHEN 5-325 MG PO TABS: 1.0000 | ORAL_TABLET | ORAL | Status: DC | PRN

## 2020-11-21 MED ORDER — MISOPROSTOL 200 MCG PO TABS
200.0000 ug | ORAL_TABLET | ORAL | Status: AC | PRN
  Administered 2020-11-21 – 2020-11-22 (×3): 200 ug via VAGINAL
  Filled 2020-11-21 (×3): qty 1

## 2020-11-21 MED ORDER — FENTANYL CITRATE (PF) 100 MCG/2ML IJ SOLN
50.0000 ug | INTRAMUSCULAR | Status: DC | PRN
  Administered 2020-11-22: 100 ug via INTRAVENOUS
  Filled 2020-11-21: qty 2

## 2020-11-21 MED ORDER — SOD CITRATE-CITRIC ACID 500-334 MG/5ML PO SOLN: 30.0000 mL | ORAL | Status: DC | PRN

## 2020-11-21 MED ORDER — OXYCODONE-ACETAMINOPHEN 5-325 MG PO TABS: 2.0000 | ORAL_TABLET | ORAL | Status: DC | PRN

## 2020-11-21 NOTE — H&P (Signed)
Samantha Mcneil is a 35 y.o. female presenting for Induction for IUFD  35 yo G3P2002@ 21+4 presents for induction for IUFD. The patient attended her normal prenatal visit earlier today and no fetal cardiac activity was detected on exam or by ultrasound consistent with fetal demise. At the anatomy US performed by MFM on 11/09/2020 overall EFW was 2.5% and all measurements were less than 5th percentile except for [facility] and cerebellum. No gross anomalies were noted. NIPT low risk female OB History    Gravida  3   Para  2   Term      Preterm      AB      Living  2     SAB      IAB      Ectopic      Multiple      Live Births  2          Past Medical History:  Diagnosis Date  . Anxiety   . Cancer [facility])    Breast cancer  . Depression   . Headache    migraines  . History of kidney stones    Past Surgical History:  Procedure Laterality Date  . BREAST RECONSTRUCTION WITH PLACEMENT OF TISSUE EXPANDER AND FLEX HD (ACELLULAR HYDRATED DERMIS) Bilateral 10/13/2018   Procedure: BILATERAL IMMEDIATE BREAST RECONSTRUCTION WITH PLACEMENT OF TISSUE EXPANDER AND FLEX HD (ACELLULAR HYDRATED DERMIS);  Surgeon: Wallace Going, DO;  Location: [place];  Service: Plastics;  Laterality: Bilateral;  . CESAREAN SECTION    . MASTECTOMY W/ SENTINEL NODE BIOPSY Bilateral 10/13/2018   Procedure: BILATERAL SKIN SPARING MASTECTOMIES WITH LEFT SENTINEL  LYMPH NODE BIOPSY.;  Surgeon: [name], MD;  Location: [place];  Service: General;  Laterality: Bilateral;  . OTHER SURGICAL HISTORY    . PORT-A-CATH REMOVAL N/A 01/13/2019   Procedure: REMOVAL PORT-A-CATH;  Surgeon: Wallace Going, DO;  Location: [place];  Service: Plastics;  Laterality: N/A;  total case time is 2 hours  . PORTACATH PLACEMENT Left 04/01/2018   Procedure: INSERTION PORT-A-CATH;  Surgeon: [name], MD;  Location: [place];  Service: General;   Laterality: Left;  . REMOVAL OF BILATERAL TISSUE EXPANDERS WITH PLACEMENT OF BILATERAL BREAST IMPLANTS Bilateral 01/13/2019   Procedure: REMOVAL OF BILATERAL TISSUE EXPANDERS WITH PLACEMENT OF BILATERAL BREAST IMPLANTS;  Surgeon: Wallace Going, DO;  Location: [place];  Service: Plastics;  Laterality: Bilateral;   Family History: family history includes Breast cancer in her cousin, cousin, and maternal aunt; Heart disease in her father; Liver cancer in her maternal grandmother; Lung cancer in her maternal grandfather. Social History:  reports that she has never smoked. She has never used smokeless tobacco. She reports current alcohol use. She reports that she does not use drugs.     Maternal Diabetes: No Genetic Screening: Normal, NIPT low risk female.  Maternal Ultrasounds/Referrals: IUGR Fetal Ultrasounds or other Referrals:  Referred to Materal Fetal Medicine  Maternal Substance Abuse:  No Significant Maternal Medications:  None Significant Maternal Lab Results:  None Other Comments:  None  Review of Systems History Dilation: Closed Effacement (%): Thick Exam by:: Yetta Glassman, RN Blood pressure 123/71, pulse 71, temperature 98.1 F (36.7 C), temperature source Oral, resp. rate 16, height 5\' 3"  (1.6 m), weight 73.5 kg, last menstrual period 07/01/2020. Exam Physical Exam  Prenatal labs: ABO, Rh: --/--/A POS (05/24 1340) Antibody: NEG (05/24 1340) Rubella:  imm RPR:  NR HBsAg:   Neg HIV: Non Reactive (03/26 2117)  GBS:     Assessment/Plan: 1) Admit 2) Bedside ultrasound again confirms fetal demise. Pt at this time does not desire autopsy. Will send Anora Microarray. Will need to obtain from OR. 3) Single medium laminaria placed intracervically with 56mcg buccal cytotec. Will continue with 215mcg vaginal cytotec Q 4 hrs 4) Pt may have epidural on request   Vanessa Kick 11/21/2020, 10:57 PM

## 2020-11-22 ENCOUNTER — Inpatient Hospital Stay (HOSPITAL_COMMUNITY): Payer: 59 | Admitting: Anesthesiology

## 2020-11-22 LAB — CBC
HCT: 32.4 % — ABNORMAL LOW (ref 36.0–46.0)
Hemoglobin: 11 g/dL — ABNORMAL LOW (ref 12.0–15.0)
MCH: 29.4 pg (ref 26.0–34.0)
MCHC: 34 g/dL (ref 30.0–36.0)
MCV: 86.6 fL (ref 80.0–100.0)
Platelets: 319 10*3/uL (ref 150–400)
RBC: 3.74 MIL/uL — ABNORMAL LOW (ref 3.87–5.11)
RDW: 12.3 % (ref 11.5–15.5)
WBC: 9.7 10*3/uL (ref 4.0–10.5)
nRBC: 0 % (ref 0.0–0.2)

## 2020-11-22 LAB — PROTIME-INR
INR: 0.9 (ref 0.8–1.2)
Prothrombin Time: 12.6 seconds (ref 11.4–15.2)

## 2020-11-22 LAB — FIBRINOGEN: Fibrinogen: 388 mg/dL (ref 210–475)

## 2020-11-22 LAB — RPR: RPR Ser Ql: NONREACTIVE

## 2020-11-22 LAB — APTT: aPTT: 25 seconds (ref 24–36)

## 2020-11-22 MED ORDER — LIDOCAINE HCL (PF) 1 % IJ SOLN
INTRAMUSCULAR | Status: DC | PRN
  Administered 2020-11-22: 10 mL via EPIDURAL

## 2020-11-22 MED ORDER — OXYTOCIN-SODIUM CHLORIDE 30-0.9 UT/500ML-% IV SOLN: 1.0000 m[IU]/min | INTRAVENOUS | Status: DC

## 2020-11-22 MED ORDER — TERBUTALINE SULFATE 1 MG/ML IJ SOLN: 0.2500 mg | Freq: Once | INTRAMUSCULAR | Status: DC | PRN

## 2020-11-22 MED ORDER — IBUPROFEN 600 MG PO TABS
600.0000 mg | ORAL_TABLET | Freq: Four times a day (QID) | ORAL | Status: DC | PRN
  Administered 2020-11-22: 600 mg via ORAL
  Filled 2020-11-22: qty 1

## 2020-11-22 MED ORDER — MISOPROSTOL 200 MCG PO TABS
ORAL_TABLET | ORAL | Status: AC
  Administered 2020-11-22: 200 ug via VAGINAL
  Filled 2020-11-22: qty 1

## 2020-11-22 MED ORDER — MISOPROSTOL 200 MCG PO TABS
200.0000 ug | ORAL_TABLET | ORAL | Status: DC
  Administered 2020-11-22 (×2): 200 ug via VAGINAL
  Filled 2020-11-22 (×2): qty 1

## 2020-11-22 NOTE — Anesthesia Procedure Notes (Signed)
Epidural Patient location during procedure: OB Start time: 11/22/2020 9:27 AM End time: 11/22/2020 9:37 AM  Staffing Anesthesiologist: Lidia Collum, MD Performed: anesthesiologist   Preanesthetic Checklist Completed: patient identified, IV checked, risks and benefits discussed, monitors and equipment checked, pre-op evaluation and timeout performed  Epidural Patient position: sitting Prep: DuraPrep Patient monitoring: heart rate, continuous pulse ox and blood pressure Approach: midline Location: L3-L4 Injection technique: LOR air  Needle:  Needle type: Tuohy  Needle gauge: 17 G Needle length: 9 cm Needle insertion depth: 5 cm Catheter type: closed end flexible Catheter size: 19 Gauge Catheter at skin depth: 10 cm Test dose: negative  Assessment Events: blood not aspirated, injection not painful, no injection resistance, no paresthesia and negative IV test  Additional Notes Reason for block:procedure for pain

## 2020-11-22 NOTE — Progress Notes (Signed)
Patient comfortable with epidural, emotionally doing ok SVE: FT externally A/P:  Continue 240mcg vaginal cyctoec q4hrs until favorable cervix Other routine care Discussed option of Anora testing, pt would like to proceed with this testing, continues to decline autopsy

## 2020-11-22 NOTE — Anesthesia Preprocedure Evaluation (Signed)
Anesthesia Evaluation  Patient identified by MRN, date of birth, ID band Patient awake    Reviewed: Allergy & Precautions, H&P , NPO status , Patient's Chart, lab work & pertinent test results  History of Anesthesia Complications Negative for: history of anesthetic complications  Airway Mallampati: II  TM Distance: >3 FB Neck ROM: full    Dental no notable dental hx.    Pulmonary neg pulmonary ROS,    Pulmonary exam normal        Cardiovascular negative cardio ROS Normal cardiovascular exam Rhythm:regular Rate:Normal     Neuro/Psych negative neurological ROS  negative psych ROS   GI/Hepatic negative GI ROS, Neg liver ROS,   Endo/Other  negative endocrine ROS  Renal/GU negative Renal ROS  negative genitourinary   Musculoskeletal   Abdominal   Peds  Hematology negative hematology ROS (+)   Anesthesia Other Findings   Reproductive/Obstetrics (+) Pregnancy                             Anesthesia Physical Anesthesia Plan  ASA: II  Anesthesia Plan: Epidural   Post-op Pain Management:    Induction:   PONV Risk Score and Plan:   Airway Management Planned:   Additional Equipment:   Intra-op Plan:   Post-operative Plan:   Informed Consent: I have reviewed the patients History and Physical, chart, labs and discussed the procedure including the risks, benefits and alternatives for the proposed anesthesia with the patient or authorized representative who has indicated his/her understanding and acceptance.       Plan Discussed with:   Anesthesia Plan Comments:         Anesthesia Quick Evaluation

## 2020-11-23 ENCOUNTER — Other Ambulatory Visit (HOSPITAL_COMMUNITY): Payer: Self-pay

## 2020-11-23 ENCOUNTER — Encounter: Payer: Self-pay | Admitting: Hematology and Oncology

## 2020-11-23 ENCOUNTER — Encounter (HOSPITAL_COMMUNITY): Payer: Self-pay | Admitting: Obstetrics and Gynecology

## 2020-11-23 MED ORDER — IBUPROFEN 600 MG PO TABS
600.0000 mg | ORAL_TABLET | Freq: Four times a day (QID) | ORAL | 0 refills | Status: DC | PRN
  Filled 2020-11-23: qty 60, 15d supply, fill #0

## 2020-11-23 NOTE — Discharge Summary (Signed)
Postpartum Discharge Summary  Date of Service updated 11/23/20     Patient Name: Samantha Mcneil DOB: 1985/12/28 MRN: [id number]  Date of admission: 11/21/2020 Delivery date:11/23/2020  Delivering provider: Allyn Kenner  Date of discharge: 11/23/2020  Admitting diagnosis: IUFD at 52 weeks or more of gestation [O36.4XX0] Intrauterine pregnancy: [redacted]w[redacted]d     Secondary diagnosis:  Active Problems:   IUFD at 56 weeks or more of gestation  Additional problems: Fetal growth restriction at anatomic survey    Discharge diagnosis: Intrauterine fetal demise                                              Post partum procedures:None Augmentation: Cytotec Complications: None  Hospital course: Induction of Labor With Vaginal Delivery   35 y.o. yo [id number] at [redacted]w[redacted]d was admitted to the hospital 11/21/2020 for induction of labor.  Indication for induction: fetal demise.  Patient had an uncomplicated labor course as follows: Membrane Rupture Time/Date: 3:33 AM ,11/23/2020   Delivery Method:Vaginal, Spontaneous  Episiotomy: None  Lacerations:  None  Details of delivery can be found in separate delivery note.  Patient had a routine postpartum course. She desired discharge home day of delivery.  She was able to ambulate, tolerate PO and pain was well controlled. Patient is discharged home 11/23/20.  Newborn Data: Birth date:11/23/2020  Birth time:3:33 AM  Gender:Female  Living status:Fetal Demise  Apgars:0 ,0  Weight:298 g     Physical exam  Vitals:   11/23/20 0330 11/23/20 0539 11/23/20 1030 11/23/20 1217  BP: 123/85  134/85 (!) 145/87  Pulse: 83  82 95  Resp:   16 15  Temp:  99.9 F (37.7 C)  98.5 F (36.9 C)  TempSrc:  Axillary  Oral  Weight:      Height:       General: alert, cooperative and no distress Lochia: appropriate Uterine Fundus: firm Incision: N/A DVT Evaluation: No evidence of DVT seen on physical exam. Labs: Lab Results  Component Value Date   WBC 9.7  11/22/2020   HGB 11.0 (L) 11/22/2020   HCT 32.4 (L) 11/22/2020   MCV 86.6 11/22/2020   PLT 319 11/22/2020   CMP Latest Ref Rng & Units 09/23/2020  Glucose 70 - 99 mg/dL 104(H)  BUN 6 - 20 mg/dL 6  Creatinine 0.44 - 1.00 mg/dL 0.62  Sodium 135 - 145 mmol/L 134(L)  Potassium 3.5 - 5.1 mmol/L 3.8  Chloride 98 - 111 mmol/L 101  CO2 22 - 32 mmol/L 27  Calcium 8.9 - 10.3 mg/dL 9.2  Total Protein 6.5 - 8.1 g/dL 7.0  Total Bilirubin 0.3 - 1.2 mg/dL 0.3  Alkaline Phos 38 - 126 U/L 54  AST 15 - 41 U/L 16  ALT 0 - 44 U/L 13   Edinburgh Score: No flowsheet data found.    After visit meds:  Allergies as of 11/23/2020   No Known Allergies     Medication List    STOP taking these medications   prenatal multivitamin Tabs tablet   valACYclovir 1000 MG tablet Commonly known as: VALTREX     TAKE these medications   ibuprofen 600 MG tablet Commonly known as: ADVIL Take 1 tablet (600 mg total) by mouth every 6 (six) hours as needed for cramping.   triamcinolone cream 0.1 % Commonly known as: KENALOG APPLY A THIN LAYER TO  THE AFFECTED AREA(S) BY TOPICAL ROUTE 2 TIMES PER DAY        Discharge home in stable condition  Discharge instruction: per After Visit Summary and Postpartum booklet. Activity: Advance as tolerated. Pelvic rest for 6 weeks.  Diet: routine diet Postpartum Appointment:2 weeks  Future Appointments: Future Appointments  Date Time Provider [place]  04/26/2021  2:15 PM Nicholas Lose, MD [facility] None   Follow up Visit:  Follow-up Information    Allyn Kenner, DO Follow up in 2 week(s).   Specialty: Obstetrics and Gynecology Contact information: [address] [place] [address] [phone]                   11/23/2020 [facility] Lars Masson, MD

## 2020-11-28 LAB — SURGICAL PATHOLOGY

## 2020-11-29 ENCOUNTER — Ambulatory Visit: Payer: 59

## 2020-11-29 NOTE — Anesthesia Postprocedure Evaluation (Signed)
Anesthesia Post Note  Patient: Samantha Mcneil  Procedure(s) Performed: AN AD [place]     Patient location during evaluation: Mother Baby Anesthesia Type: Epidural Level of consciousness: awake and alert Pain management: pain level controlled Vital Signs Assessment: post-procedure vital signs reviewed and stable Respiratory status: spontaneous breathing, nonlabored ventilation and respiratory function stable Cardiovascular status: stable Postop Assessment: no headache, no backache and able to ambulate Anesthetic complications: no   No complications documented.  Last Vitals: There were no vitals filed for this visit.  Last Pain: There were no vitals filed for this visit.               Merlinda Frederick

## 2020-12-01 ENCOUNTER — Other Ambulatory Visit (HOSPITAL_COMMUNITY): Payer: Self-pay

## 2020-12-26 ENCOUNTER — Other Ambulatory Visit (HOSPITAL_COMMUNITY): Payer: Self-pay

## 2020-12-26 MED ORDER — LEVONORGESTREL-ETHINYL ESTRAD 0.1-20 MG-MCG PO TABS
ORAL_TABLET | ORAL | 2 refills | Status: AC
  Filled 2020-12-26: qty 84, 84d supply, fill #0
  Filled 2021-03-31: qty 84, 84d supply, fill #1
  Filled 2021-06-15: qty 84, 84d supply, fill #2

## 2021-01-26 ENCOUNTER — Other Ambulatory Visit (HOSPITAL_COMMUNITY): Payer: Self-pay

## 2021-01-26 MED ORDER — NITROFURANTOIN MONOHYD MACRO 100 MG PO CAPS
ORAL_CAPSULE | ORAL | 0 refills | Status: DC
  Filled 2021-01-26: qty 10, 5d supply, fill #0

## 2021-02-06 ENCOUNTER — Encounter: Payer: Self-pay | Admitting: Neurology

## 2021-03-31 ENCOUNTER — Other Ambulatory Visit (HOSPITAL_COMMUNITY): Payer: Self-pay

## 2021-04-09 ENCOUNTER — Telehealth: Payer: Self-pay | Admitting: *Deleted

## 2021-04-09 NOTE — Telephone Encounter (Signed)
RN attempt x1 to contact pt regarding 05/03/21 appt and MD being out of office.  LVM with detailed information regarding rescheduled appt date and time.

## 2021-04-17 ENCOUNTER — Encounter: Payer: Self-pay | Admitting: Hematology and Oncology

## 2021-04-24 NOTE — Progress Notes (Signed)
NEUROLOGY CONSULTATION NOTE  [name] MRN: [id number] DOB: 1986/06/26  Referring provider: Irene Pap, MD Primary care provider: No PCP  Reason for consult:  headache  Assessment/Plan:   Migraine with and without aura  Migraine prevention:  CGRP inhibitor.  She will contact her insurance and let us know which is formulary ([place], [place], Teaching laboratory technician) Migraine rescue:  rizatriptan 10mg , Zofran 4mg  Stop Tylenol and Advil Limit use of pain relievers to no more than 2 days out of week to prevent risk of rebound or medication-overuse headache. Keep headache diary Follow up 6 months.    Subjective:  Samantha Mcneil is a 35 year old right-handed female with depression, anxiety and history of breast cancer and kidney stones who presents for headaches.  History supplemented by referring provider's note.  Onset:  childhood.  Headaches became more frequent when she moved to the [place] in 2009 - she had severe headaches every week.  Can't remember when she started having a near daily headache. Location:  across forehead and temples, sometimes across back of head Quality:  stabbing Intensity:  mild to severe.  She denies new headache, thunderclap headache or severe headache that wakes her from sleep. Aura:  sometimes scotoma Prodrome:  absent Associated symptoms:  Severe- nausea, vomiting, photophobia, phonophobia, osmophobia; Mild - dizziness, cold sweat.  She denies associated unilateral numbness or weakness. Duration:  Mild - 3 days; Severe - several hours but may occur the next day Frequency:  Mild- every 2-3 days; Severe - every 2 weeks Frequency of abortive medication: takes ibuprofen and acetaminophen every 3 to 4 days Triggers:  diary, chips, soda, change in weather, perfumes Relieving factors:  essential oils (mint) Activity:  severe - cannot function  Previous workup for headache was a CT head on 01/04/2019 which was normal.  Rescue protocol - ibuprofen and  acetaminophen Current NSAIDS/analgesics:  ibuprofen, Excedrin  Current triptans:  none Current ergotamine:  none Current anti-emetic:  none Current muscle relaxants:  none Current Antihypertensive medications:  none Current Antidepressant medications:  none Current Anticonvulsant medications:  none Current anti-CGRP:  none Current Vitamins/Herbal/Supplements:  none Current Antihistamines/Decongestants:  none Other therapy:  none Hormone/birth control:  Aviane   Past NSAIDS/analgesics:  Excedrin, Aleve Past abortive triptans:  none Past abortive ergotamine:  none Past muscle relaxants:  none Past anti-emetic:  Zofran, Compazine Past antihypertensive medications:  none.  Would rather avoid beta blockers as she has baseline low blood pressure. Past antidepressant medications:  venlafaxine Past anticonvulsant medications:  none.  Cannot take topiramate due to history of recurrent kidney stones. Past anti-CGRP:  none Past vitamins/Herbal/Supplements:  none Past antihistamines/decongestants:  none Other past therapies:  none  Caffeine:  1 cup of coffee daily; sometimes a cola Diet:  Does not drink enough water.  Does not skip meals Exercise:  no Depression:  no; Anxiety:  no Other pain:  no Sleep:  difficulty staying asleep.  4-5 hours of sleep a night Family history of headache:  cousins      PAST MEDICAL HISTORY: Past Medical History:  Diagnosis Date   Anxiety    Cancer ([place])    Breast cancer   Depression    Headache    migraines   History of kidney stones     PAST SURGICAL HISTORY: Past Surgical History:  Procedure Laterality Date   BREAST RECONSTRUCTION WITH PLACEMENT OF TISSUE EXPANDER AND FLEX HD (ACELLULAR HYDRATED DERMIS) Bilateral 10/13/2018   Procedure: BILATERAL IMMEDIATE BREAST RECONSTRUCTION WITH PLACEMENT OF TISSUE EXPANDER  AND FLEX HD (ACELLULAR HYDRATED DERMIS);  Surgeon: Wallace Going, DO;  Location: [facility];  Service:  Plastics;  Laterality: Bilateral;   CESAREAN SECTION     MASTECTOMY W/ SENTINEL NODE BIOPSY Bilateral 10/13/2018   Procedure: BILATERAL SKIN SPARING MASTECTOMIES WITH LEFT SENTINEL  LYMPH NODE BIOPSY.;  Surgeon: [name], MD;  Location: [facility];  Service: General;  Laterality: Bilateral;   OTHER SURGICAL HISTORY     PORT-A-CATH REMOVAL N/A 01/13/2019   Procedure: REMOVAL PORT-A-CATH;  Surgeon: Wallace Going, DO;  Location: [facility];  Service: Plastics;  Laterality: N/A;  total case time is 2 hours   PORTACATH PLACEMENT Left 04/01/2018   Procedure: INSERTION PORT-A-CATH;  Surgeon: [name], MD;  Location: [facility];  Service: General;  Laterality: Left;   REMOVAL OF BILATERAL TISSUE EXPANDERS WITH PLACEMENT OF BILATERAL BREAST IMPLANTS Bilateral 01/13/2019   Procedure: REMOVAL OF BILATERAL TISSUE EXPANDERS WITH PLACEMENT OF BILATERAL BREAST IMPLANTS;  Surgeon: Wallace Going, DO;  Location: [facility];  Service: Plastics;  Laterality: Bilateral;    MEDICATIONS: Current Outpatient Medications on File Prior to Visit  Medication Sig Dispense Refill   ibuprofen (ADVIL) 600 MG tablet Take 1 tablet (600 mg total) by mouth every 6 (six) hours as needed for cramping. 60 tablet 0   levonorgestrel-ethinyl estradiol (AVIANE) 0.1-20 MG-MCG tablet Take 1 tablet by mouth daily. 84 tablet 2   nitrofurantoin, macrocrystal-monohydrate, (MACROBID) 100 MG capsule Take 1 capsule by mouth twice daily for 5 days 10 capsule 0   triamcinolone (KENALOG) 0.1 % APPLY A THIN LAYER TO THE AFFECTED AREA(S) BY TOPICAL ROUTE 2 TIMES PER DAY 30 g 1   [DISCONTINUED] prochlorperazine (COMPAZINE) 10 MG tablet Take 1 tablet (10 mg total) by mouth every 6 (six) hours as needed (Nausea or vomiting). 30 tablet 1   No current facility-administered medications on file prior to visit.    ALLERGIES: No Known Allergies  FAMILY HISTORY: Family  History  Problem Relation Age of Onset   Heart disease Father        d. 63   Breast cancer Maternal Aunt        d. 57   Breast cancer Cousin        dx 61   Liver cancer Maternal Grandmother        d. 48   Lung cancer Maternal Grandfather        d. 25   Breast cancer Cousin        dx 60s    Objective:  Blood pressure 104/65, pulse 84, height 5\' 2"  (1.575 m), weight 171 lb 3.2 oz (77.7 kg), SpO2 96 %. General: No acute distress.  Patient appears well-groomed.   Head:  Normocephalic/atraumatic Eyes:  fundi examined but not visualized Neck: supple, no paraspinal tenderness, full range of motion Back: No paraspinal tenderness Heart: regular rate and rhythm Lungs: Clear to auscultation bilaterally. Vascular: No carotid bruits. Neurological Exam: Mental status: alert and oriented to person, place, and time, recent and remote memory intact, fund of knowledge intact, attention and concentration intact, speech fluent and not dysarthric, language intact. Cranial nerves: CN I: not tested CN II: pupils equal, round and reactive to light, visual fields intact CN III, IV, VI:  full range of motion, no nystagmus, no ptosis CN V: facial sensation intact. CN VII: upper and lower face symmetric CN VIII: hearing intact CN IX, X: gag intact, uvula midline CN XI: sternocleidomastoid  and trapezius muscles intact CN XII: tongue midline Bulk & Tone: normal, no fasciculations. Motor:  muscle strength 5/5 throughout Sensation:  Pinprick, temperature and vibratory sensation intact. Deep Tendon Reflexes:  2+ throughout,  toes downgoing.   Finger to nose testing:  Without dysmetria.   Heel to shin:  Without dysmetria.   Gait:  Normal station and stride.  Romberg negative.    Thank you for allowing me to take part in the care of this patient.  Metta Clines, DO  CC: Irene Pap, MD

## 2021-04-26 ENCOUNTER — Ambulatory Visit: Payer: 59 | Admitting: Neurology

## 2021-04-26 ENCOUNTER — Other Ambulatory Visit: Payer: Self-pay

## 2021-04-26 ENCOUNTER — Ambulatory Visit: Payer: 59 | Admitting: Hematology and Oncology

## 2021-04-26 ENCOUNTER — Encounter: Payer: Self-pay | Admitting: Neurology

## 2021-04-26 VITALS — BP 104/65 | HR 84 | Ht 62.0 in | Wt 171.2 lb

## 2021-04-26 DIAGNOSIS — G43109 Migraine with aura, not intractable, without status migrainosus: Secondary | ICD-10-CM

## 2021-04-26 DIAGNOSIS — G43009 Migraine without aura, not intractable, without status migrainosus: Secondary | ICD-10-CM | POA: Diagnosis not present

## 2021-04-26 MED ORDER — ONDANSETRON 4 MG PO TBDP: 4.0000 mg | ORAL_TABLET | Freq: Three times a day (TID) | ORAL | 5 refills | Status: AC | PRN

## 2021-04-26 MED ORDER — RIZATRIPTAN BENZOATE 10 MG PO TBDP: ORAL_TABLET | ORAL | 5 refills | Status: AC

## 2021-04-26 NOTE — Patient Instructions (Signed)
  Contact your insurance to find out which monthly injection is covered and let us know so I can prescribe it:  AIMOVIG, AJOVY, EMGALITY Stop Tylenol and Advil Take rizatriptan 10mg  at earliest onset of headache.  May repeat dose once in 2 hours if needed.  Maximum 2 tablets in 24 hours. Take ondansetron for nausea Limit use of pain relievers to no more than 2 days out of the week.  These medications include acetaminophen, NSAIDs (ibuprofen/Advil/Motrin, naproxen/Aleve, triptans (Imitrex/sumatriptan), Excedrin, and narcotics.  This will help reduce risk of rebound headaches. Be aware of common food triggers:  - Caffeine:  coffee, black tea, cola, Mt. Dew  - Chocolate  - Dairy:  aged cheeses (brie, blue, cheddar, gouda, [place], provolone, Garland, Swiss, etc), chocolate milk, buttermilk, sour cream, limit eggs and yogurt  - Nuts, peanut butter  - Alcohol  - Cereals/grains:  FRESH breads (fresh bagels, sourdough, doughnuts), yeast productions  - Processed/canned/aged/cured meats (pre-packaged deli meats, hotdogs)  - MSG/glutamate:  soy sauce, flavor enhancer, pickled/preserved/marinated foods  - Sweeteners:  aspartame (Equal, Nutrasweet).  Sugar and Splenda are okay  - Vegetables:  legumes (lima beans, lentils, snow peas, fava beans, pinto peans, peas, garbanzo beans), sauerkraut, onions, olives, pickles  - Fruit:  avocados, bananas, citrus fruit (orange, lemon, grapefruit), mango  - Other:  Frozen meals, macaroni and cheese Routine exercise Stay adequately hydrated (aim for 64 oz water daily) Keep headache diary Maintain proper stress management Maintain proper sleep hygiene Do not skip meals Consider supplements:  magnesium citrate 400mg  daily, riboflavin 400mg  daily, coenzyme Q10 100mg  three times daily.

## 2021-05-03 ENCOUNTER — Ambulatory Visit: Payer: 59 | Admitting: Hematology and Oncology

## 2021-05-08 NOTE — Progress Notes (Signed)
Patient Care Team: Patient, [name] (Inactive) as PCP - General (General Practice) D[place]ham, Loel Lofty, DO as Attending Physician (Plastic Surgery) [name], MD as Consulting Physician (General Surgery) Nicholas Lose, MD as Consulting Physician (Hematology and Oncology)  DIAGNOSIS:    ICD-10-CM   1. Malignant neoplasm of upper-outer quadrant of left breast in female, estrogen receptor negative ([place])  C50.412    Z17.1       SUMMARY OF ONCOLOGIC HISTORY: Oncology History  Malignant neoplasm of upper-outer quadrant of left breast in female, estrogen receptor negative ([place])  03/12/2018 Initial Diagnosis   Palpable masses in both breasts with family history of breast cancer, breast density category D, ultrasound measured these hypoechoic irregular mass left breast 2.3 cm, right breast no abnormality, biopsy left breast mass: IDC grade 3 ER 0%, PR 0%, HER-2 negative, Ki-67 90%, lymph node biopsy benign: T2N0   03/18/2018 Cancer Staging   Staging form: Breast, AJCC 8th Edition - Clinical: Stage IIB (cT2, cN0, cM0, G3, ER-, PR-, HER2-) - Signed by Nicholas Lose, MD on 03/18/2018    03/30/2018 Breast MRI   Single biopsy-proven malignancy in the left 12 o'clock breast measures 2.3 x 1.3 x 1.5 cm.  Right breast without any malignancy detected.   04/02/2018 - 08/20/2018 Neo-Adjuvant Chemotherapy   Neoadjuvant chemotherapy with dose dense Adriamycin and Cytoxan followed by Taxol and carboplatin   04/23/2018 Genetic Testing   Positive genetic testing: A pathogenic variant was identified in BRCA2 called [id number] (p.Ala2951Glyfs*26) on the Common Hereditary Cancers Panel. The Common Hereditary Cancers Panel offered by [facility] includes sequencing and/or deletion duplication testing of the following 47 genes: APC, ATM, AXIN2, BARD1, BMPR1A, BRCA1, BRCA2, BRIP1, CDH1, CDKN2A (p14ARF), CDKN2A (p16INK4a), CKD4, CHEK2, CTNNA1, DICER1, EPCAM (Deletion/duplication testing only), GREM1 (promoter  region deletion/duplication testing only), KIT, MEN1, MLH1, MSH2, MSH3, MSH6, MUTYH, NBN, NF1, NHTL1, PALB2, PDGFRA, PMS2, POLD1, POLE, PTEN, RAD50, RAD51C, RAD51D, SDHB, SDHC, SDHD, SMAD4, SMARCA4. STK11, TP53, TSC1, TSC2, and VHL.  The following genes were evaluated for sequence changes only: SDHA and HOXB13 c.251G>A variant only. Genetic testing did detect a Variant of Unknown Significance (VUS) in the PALB2 gene called c.205C>T.  At this time, it is unknown if this variant is associated with increased cancer risk or if this is a normal finding, but most variants such as this get reclassified to being inconsequential. It should not be used to make medical management decisions.   The report date is 04/23/2018.    10/13/2018 Surgery   Bilateral mastectomies: right, benign; left, complete therapeutic response, no residual carcinoma, 3 SLN negative   10/15/2018 Cancer Staging   Staging form: Breast, AJCC 8th Edition - Pathologic: No Stage Recommended (ypT0, pN0, cM0) - Signed by [name], NP on 10/15/2018      CHIEF COMPLIANT: Surveillance of triple negative left breast cancer  INTERVAL HISTORY: Samantha Mcneil is a 35 y.o. with above-mentioned history of triple negative left breast cancer who completed neoadjuvant chemotherapy and underwent bilateral mastectomies with complete response to treatment. She presents today to the clinic today for follow-up.  She lost her pregnancy last year.  She is complaining of slight swelling on the left chest wall.  ALLERGIES:  has No Known Allergies.  MEDICATIONS:  Current Outpatient Medications  Medication Sig Dispense Refill   ibuprofen (ADVIL) 600 MG tablet Take 1 tablet (600 mg total) by mouth every 6 (six) hours as needed for cramping. 60 tablet 0   levonorgestrel-ethinyl estradiol (AVIANE) 0.1-20 MG-MCG tablet  Take 1 tablet by mouth daily. 84 tablet 2   nitrofurantoin, macrocrystal-monohydrate, (MACROBID) 100 MG capsule Take 1  capsule by mouth twice daily for 5 days (Patient not taking: Reported on 04/26/2021) 10 capsule 0   ondansetron (ZOFRAN ODT) 4 MG disintegrating tablet Take 1 tablet (4 mg total) by mouth every 8 (eight) hours as needed for nausea or vomiting. 20 tablet 5   rizatriptan (MAXALT-MLT) 10 MG disintegrating tablet Take 1 tablet earliest onset of headache.  May repeat in 2 hours if needed.  Maximum 2 tablets in 24 hours 10 tablet 5   triamcinolone (KENALOG) 0.1 % APPLY A THIN LAYER TO THE AFFECTED AREA(S) BY TOPICAL ROUTE 2 TIMES PER DAY (Patient not taking: Reported on 04/26/2021) 30 g 1   No current facility-administered medications for this visit.    PHYSICAL EXAMINATION: ECOG PERFORMANCE STATUS: 1 - Symptomatic but completely ambulatory  Vitals:   05/09/21 1129  BP: (!) 115/52  Pulse: 86  Resp: 18  Temp: 97.9 F (36.6 C)  SpO2: 100%   Filed Weights   05/09/21 1129  Weight: 170 lb 1.6 oz (77.2 kg)    BREAST: (exam performed in the presence of a chaperone)  LABORATORY DATA:  I have reviewed the data as listed CMP Latest Ref Rng & Units 09/23/2020 08/20/2018 08/13/2018  Glucose 70 - 99 mg/dL 104(H) 98 117(H)  BUN 6 - 20 mg/dL _0 Creatinine 0.44 - 1.00 mg/dL 0.62 0.66 0.66  Sodium 135 - 145 mmol/L 134(L) 142 142  Potassium 3.5 - 5.1 mmol/L 3.8 3.7 4.1  Chloride 98 - 111 mmol/L 101 106 106  CO2 22 - 32 mmol/L _1 Calcium 8.9 - 10.3 mg/dL 9.2 9.4 9.7  Total Protein 6.5 - 8.1 g/dL 7.0 6.7 6.9  Total Bilirubin 0.3 - 1.2 mg/dL 0.3 0.3 0.3  Alkaline Phos 38 - 126 U/L 54 114 106  AST 15 - 41 U/L 16 18 41  ALT 0 - 44 U/L 13 33 62(H)    Lab Results  Component Value Date   WBC 9.7 11/22/2020   HGB 11.0 (L) 11/22/2020   HCT 32.4 (L) 11/22/2020   MCV 86.6 11/22/2020   PLT 319 11/22/2020   NEUTROABS 5.6 09/23/2020    ASSESSMENT & PLAN:  Malignant neoplasm of upper-outer quadrant of left breast in female, estrogen receptor negative ([name]) /05/2018:Palpable masses in both  breasts with family history of breast cancer, breast density category D, ultrasound measured these hypoechoic irregular mass left breast 2.3 cm, right breast no abnormality, biopsy left breast mass: IDC grade 3 ER 0%, PR 0%, HER-2 negative, Ki-67 90%, lymph node biopsy benign: T2N0, stage IIb   Recommendation based on multidisciplinary tumor board: Genetics consultation 1. Neoadjuvant chemotherapy with Adriamycin and Cytoxan dose dense 4 followed by Taxol with carboplatin weekly 12  completed 08/20/2018 2. Followed by bilateral mastectomies with reconstruction with targeted axillary dissection 10/13/2018: Complete pathologic response, 0/3 lymph nodes negative   BRCA2 mutation: Bilateral mastectomies 10/13/2018 Oophorectomy after the age of 60 ---------------------------------------------------------------------------------------------------------------- Plan: surveillance. 1.  Breast examination:  05/09/2021: Benign: Bilateral reconstructed breasts without any lumps or nodules of concern 2.  No role of imaging studies because she had bilateral mastectomies.   Shingles on the abdominal wall: I sent a prescription for Valtrex thousand p.o. twice daily for 7 days. Pregnancy: Unfortunately she lost her pregnancy. Plastic surgery: She has not gone back to her home country for plastic surgery.  She also [place] and has  recently moved to [facility] Return to clinic in 1 year for surveillance and follow-up    No orders of the defined types were placed in this encounter.  The patient has a good understanding of the overall plan. she agrees with it. she will call with any problems that may develop before the next visit here.  Total time spent: 20 mins including face to face time and time spent for planning, charting and coordination of care  Rulon Eisenmenger, MD, MPH 05/09/2021  I, Thana Ates, am acting as scribe for Dr. Nicholas Lose.  I have reviewed the above documentation for accuracy and  completeness, and I agree with the above.

## 2021-05-09 ENCOUNTER — Inpatient Hospital Stay: Payer: 59 | Attending: Hematology and Oncology | Admitting: Hematology and Oncology

## 2021-05-09 ENCOUNTER — Other Ambulatory Visit: Payer: Self-pay

## 2021-05-09 DIAGNOSIS — Z171 Estrogen receptor negative status [ER-]: Secondary | ICD-10-CM | POA: Diagnosis not present

## 2021-05-09 DIAGNOSIS — C50412 Malignant neoplasm of upper-outer quadrant of left female breast: Secondary | ICD-10-CM | POA: Diagnosis not present

## 2021-05-09 DIAGNOSIS — Z853 Personal history of malignant neoplasm of breast: Secondary | ICD-10-CM | POA: Diagnosis present

## 2021-05-09 DIAGNOSIS — Z9013 Acquired absence of bilateral breasts and nipples: Secondary | ICD-10-CM | POA: Diagnosis not present

## 2021-05-09 DIAGNOSIS — Z9221 Personal history of antineoplastic chemotherapy: Secondary | ICD-10-CM | POA: Diagnosis not present

## 2021-05-09 NOTE — Assessment & Plan Note (Signed)
/  05/2018:Palpable masses in both breasts with family history of breast cancer, breast density category D, ultrasound measured these hypoechoic irregular mass left breast 2.3 cm, right breast no abnormality, biopsy left breast mass: IDC grade 3 ER 0%, PR 0%, HER-2 negative, Ki-67 90%, lymph node biopsy benign: T2N0, stage IIb  Recommendationbased on multidisciplinary tumor board: Genetics consultation 1. Neoadjuvant chemotherapy with Adriamycin and Cytoxan dose dense 4 followed byTaxol with carboplatinweekly 12completed 08/20/2018 2. Followed bybilateral mastectomies with reconstructionwith targeted axillary dissection4/14/2020: Complete pathologic response, 0/3 lymph nodes negative  BRCA2 mutation:Bilateral mastectomies 10/13/2018 Oophorectomy after the age of 62 ---------------------------------------------------------------------------------------------------------------- Plan: surveillance. 1.Breast examination:  05/09/2021: Benign: Bilateral reconstructed breasts without any lumps or nodules of concern 2.No role of imaging studies because she had bilateral mastectomies.   Shingles on the abdominal wall: I sent a prescription for Valtrex thousand p.o. twice daily for 7 days. Pregnancy: Plastic surgery: Return to clinic in 1 year for surveillance and follow-up

## 2021-06-16 ENCOUNTER — Other Ambulatory Visit (HOSPITAL_COMMUNITY): Payer: Self-pay

## 2021-06-28 ENCOUNTER — Encounter: Payer: Self-pay | Admitting: Hematology and Oncology

## 2021-09-07 ENCOUNTER — Encounter: Payer: Self-pay | Admitting: Hematology and Oncology

## 2021-09-07 ENCOUNTER — Other Ambulatory Visit (HOSPITAL_COMMUNITY): Payer: Self-pay

## 2021-09-07 MED ORDER — DESOGESTREL-ETHINYL ESTRADIOL 0.15-0.02/0.01 MG (21/5) PO TABS
ORAL_TABLET | ORAL | 3 refills | Status: AC
  Filled 2021-09-07: qty 84, 84d supply, fill #0

## 2021-10-16 ENCOUNTER — Encounter: Payer: Self-pay | Admitting: Hematology and Oncology

## 2021-10-29 NOTE — Progress Notes (Deleted)
NEUROLOGY FOLLOW UP OFFICE NOTE  Samantha Mcneil [id number]  Assessment/Plan:   Migrainewith and without aura   Migraine prevention:  CGRP inhibitor.  She will contact her insurance and let us know which is formulary ([place], [place], [employer]) Migraine rescue:  rizatriptan '10mg'$ , Zofran '4mg'$  Stop Tylenol and Advil Limit use of pain relievers to no more than 2 days out of week to prevent risk of rebound or medication-overuse headache. Keep headache diary Follow up 6 months.       Subjective:  Samantha Mcneil is a 36 year old right-handed female with depression, anxiety and history of breast cancer and kidney stones who follows up for migraines.  UPDATE: Intensity:  *** Duration:  *** Frequency:  *** Frequency of abortive medication: *** Rescue protocol - ibuprofen and acetaminophen Current NSAIDS/analgesics:  ibuprofen, Excedrin  Current triptans:  rizatriptan '10mg'$  Current ergotamine:  none Current anti-emetic:  Zofran '4mg'$  Current muscle relaxants:  none Current Antihypertensive medications:  none Current Antidepressant medications:  none Current Anticonvulsant medications:  none Current anti-CGRP:  none Current Vitamins/Herbal/Supplements:  none Current Antihistamines/Decongestants:  none Other therapy:  none Hormone/birth control:  Aviane  Caffeine:  1 cup of coffee daily; sometimes a cola Diet:  Does not drink enough water.  Does not skip meals Exercise:  no Depression:  no; Anxiety:  no Other pain:  no Sleep:  difficulty staying asleep.  4-5 hours of sleep a night  HISTORY:  Onset:  childhood.  Headaches became more frequent when she moved to the [place] in 2009 - she had severe headaches every week.  Can't remember when she started having a near daily headache. Location:  across forehead and temples, sometimes across back of head Quality:  stabbing Initial Intensity:  mild to severe.  She denies new headache, thunderclap headache or severe headache  that wakes her from sleep. Aura:  sometimes scotoma Prodrome:  absent Associated symptoms:  Severe- nausea, vomiting, photophobia, phonophobia, osmophobia; Mild - dizziness, cold sweat.  She denies associated unilateral numbness or weakness. Initial Duration:  Mild - 3 days; Severe - several hours but may occur the next day Initial Frequency:  Mild- every 2-3 days; Severe - every 2 weeks Initial Frequency of abortive medication: takes ibuprofen and acetaminophen every 3 to 4 days Triggers:  diary, chips, soda, change in weather, perfumes Relieving factors:  essential oils (mint) Activity:  severe - cannot function   Previous workup for headache was a CT head on 01/04/2019 which was normal.      Past NSAIDS/analgesics:  Excedrin, Aleve Past abortive triptans:  none Past abortive ergotamine:  none Past muscle relaxants:  none Past anti-emetic:  Zofran, Compazine Past antihypertensive medications:  none.  Would rather avoid beta blockers as she has baseline low blood pressure. Past antidepressant medications:  venlafaxine Past anticonvulsant medications:  none.  Cannot take topiramate due to history of recurrent kidney stones. Past anti-CGRP:  none Past vitamins/Herbal/Supplements:  none Past antihistamines/decongestants:  none Other past therapies:  none    Family history of headache:  cousins  PAST MEDICAL HISTORY: Past Medical History:  Diagnosis Date   Anxiety    Cancer ([place])    Breast cancer   Depression    Headache    migraines   History of kidney stones     MEDICATIONS: Current Outpatient Medications on File Prior to Visit  Medication Sig Dispense Refill   desogestrel-ethinyl estradiol (MIRCETTE) 0.15-0.02/0.01 MG (21/5) tablet Take 1 tablet by mouth every day 84 tablet 3  levonorgestrel-ethinyl estradiol (AVIANE) 0.1-20 MG-MCG tablet Take 1 tablet by mouth daily. 84 tablet 2   ondansetron (ZOFRAN ODT) 4 MG disintegrating tablet Take 1 tablet (4 mg total) by mouth  every 8 (eight) hours as needed for nausea or vomiting. 20 tablet 5   rizatriptan (MAXALT-MLT) 10 MG disintegrating tablet Take 1 tablet earliest onset of headache.  May repeat in 2 hours if needed.  Maximum 2 tablets in 24 hours 10 tablet 5   [DISCONTINUED] prochlorperazine (COMPAZINE) 10 MG tablet Take 1 tablet (10 mg total) by mouth every 6 (six) hours as needed (Nausea or vomiting). 30 tablet 1   No current facility-administered medications on file prior to visit.    ALLERGIES: No Known Allergies  FAMILY HISTORY: Family History  Problem Relation Age of Onset   Heart disease Father        d. 14   Breast cancer Maternal Aunt        d. 47   Breast cancer Cousin        dx 7   Liver cancer Maternal Grandmother        d. 52   Lung cancer Maternal Grandfather        d. 72   Breast cancer Cousin        dx 60s      Objective:  *** General: No acute distress.  Patient appears ***-groomed.   Head:  Normocephalic/atraumatic Eyes:  Fundi examined but not visualized Neck: supple, no paraspinal tenderness, full range of motion Heart:  Regular rate and rhythm Lungs:  Clear to auscultation bilaterally Back: No paraspinal tenderness Neurological Exam: alert and oriented to person, place, and time.  Speech fluent and not dysarthric, language intact.  CN II-XII intact. Bulk and tone normal, muscle strength 5/5 throughout.  Sensation to light touch intact.  Deep tendon reflexes 2+ throughout, toes downgoing.  Finger to nose testing intact.  Gait normal, Romberg negative.   Metta Clines, DO  CC: ***

## 2021-10-31 ENCOUNTER — Encounter: Payer: Self-pay | Admitting: Neurology

## 2021-10-31 ENCOUNTER — Ambulatory Visit: Payer: BLUE CROSS/BLUE SHIELD | Admitting: Neurology

## 2021-10-31 DIAGNOSIS — Z029 Encounter for administrative examinations, unspecified: Secondary | ICD-10-CM

## 2021-12-29 ENCOUNTER — Other Ambulatory Visit: Payer: Self-pay | Admitting: Nurse Practitioner
